# Patient Record
Sex: Male | Born: 1935 | Race: White | Hispanic: No | Smoking: Former smoker
Health system: Southern US, Community
[De-identification: ages and names within clinical notes are randomized; demographics above are authoritative.]

## PROBLEM LIST (undated history)

## (undated) DIAGNOSIS — E119 Type 2 diabetes mellitus without complications: Secondary | ICD-10-CM

## (undated) DIAGNOSIS — Z85038 Personal history of other malignant neoplasm of large intestine: Secondary | ICD-10-CM

## (undated) DIAGNOSIS — E785 Hyperlipidemia, unspecified: Secondary | ICD-10-CM

## (undated) DIAGNOSIS — N200 Calculus of kidney: Secondary | ICD-10-CM

## (undated) DIAGNOSIS — N400 Enlarged prostate without lower urinary tract symptoms: Secondary | ICD-10-CM

## (undated) DIAGNOSIS — I10 Essential (primary) hypertension: Secondary | ICD-10-CM

## (undated) DIAGNOSIS — D649 Anemia, unspecified: Secondary | ICD-10-CM

## (undated) DIAGNOSIS — Z5189 Encounter for other specified aftercare: Secondary | ICD-10-CM

## (undated) DIAGNOSIS — Z720 Tobacco use: Secondary | ICD-10-CM

## (undated) DIAGNOSIS — I482 Chronic atrial fibrillation, unspecified: Secondary | ICD-10-CM

## (undated) DIAGNOSIS — K5900 Constipation, unspecified: Secondary | ICD-10-CM

## (undated) DIAGNOSIS — I469 Cardiac arrest, cause unspecified: Secondary | ICD-10-CM

## (undated) DIAGNOSIS — K861 Other chronic pancreatitis: Secondary | ICD-10-CM

## (undated) HISTORY — PX: LITHOTRIPSY: SUR834

## (undated) HISTORY — PX: TRANSURETHRAL RESECTION OF PROSTATE: SHX73

## (undated) HISTORY — PX: CARDIAC CATHETERIZATION: SHX172

## (undated) HISTORY — PX: CHOLECYSTECTOMY: SHX55

## (undated) HISTORY — PX: EYE SURGERY: SHX253

## (undated) HISTORY — PX: COLON SURGERY: SHX602

---

## 2011-05-27 ENCOUNTER — Other Ambulatory Visit (HOSPITAL_COMMUNITY): Payer: Self-pay

## 2011-05-27 ENCOUNTER — Inpatient Hospital Stay
Admission: AD | Admit: 2011-05-27 | Discharge: 2011-06-18 | Disposition: A | Discharge status: Skilled Nursing Facility | Payer: Self-pay | Source: Ambulatory Visit | Attending: Internal Medicine | Admitting: Internal Medicine

## 2011-05-27 DIAGNOSIS — I517 Cardiomegaly: Secondary | ICD-10-CM

## 2011-05-27 LAB — CBC
HCT: 33.1 % — ABNORMAL LOW (ref 39.0–52.0)
Hemoglobin: 11.4 g/dL — ABNORMAL LOW (ref 13.0–17.0)
WBC: 8.1 10*3/uL (ref 4.0–10.5)

## 2011-05-27 LAB — MAGNESIUM: Magnesium: 2.2 mg/dL (ref 1.5–2.5)

## 2011-05-27 LAB — ABO/RH: ABO/RH(D): O POS

## 2011-05-27 LAB — IRON AND TIBC
Iron: 39 ug/dL — ABNORMAL LOW (ref 42–135)
Saturation Ratios: 21 % (ref 20–55)
TIBC: 189 ug/dL — ABNORMAL LOW (ref 215–435)
UIBC: 150 ug/dL

## 2011-05-27 LAB — BASIC METABOLIC PANEL
BUN: 14 mg/dL (ref 6–23)
Chloride: 106 mEq/L (ref 96–112)
GFR calc Af Amer: >60 mL/min (ref >60)
Glucose, Bld: 97 mg/dL (ref 70–99)
Potassium: 3.2 mEq/L — ABNORMAL LOW (ref 3.5–5.1)
Sodium: 137 mEq/L (ref 135–145)

## 2011-05-27 LAB — FERRITIN: Ferritin: 1093 ng/mL — ABNORMAL HIGH (ref 22–322)

## 2011-05-27 LAB — TYPE AND SCREEN
ABO/RH(D): O POS
Antibody Screen: NEGATIVE

## 2011-05-28 LAB — PROCALCITONIN: Procalcitonin: 0.11 ng/mL

## 2011-05-28 LAB — PREALBUMIN: Prealbumin: 10.7 mg/dL — ABNORMAL LOW (ref 17.0–34.0)

## 2011-05-28 LAB — BASIC METABOLIC PANEL
BUN: 17 mg/dL (ref 6–23)
CO2: 26 mEq/L (ref 19–32)
Chloride: 107 mEq/L (ref 96–112)
GFR calc Af Amer: >60 mL/min (ref >60)
Potassium: 3.5 mEq/L (ref 3.5–5.1)

## 2011-05-28 LAB — OCCULT BLOOD X 1 CARD TO LAB, STOOL: Fecal Occult Bld: NEGATIVE

## 2011-05-29 LAB — PROTIME-INR: INR: 2.13 — ABNORMAL HIGH (ref 0.00–1.49)

## 2011-05-30 ENCOUNTER — Other Ambulatory Visit (HOSPITAL_COMMUNITY): Payer: Self-pay

## 2011-05-30 ENCOUNTER — Other Ambulatory Visit (HOSPITAL_COMMUNITY): Payer: Medicare Other

## 2011-05-30 LAB — BLOOD GAS, ARTERIAL
Bicarbonate: 26.5 mEq/L — ABNORMAL HIGH (ref 20.0–24.0)
FIO2: 0.21 %
FIO2: 0.21 %
Patient temperature: 98.6
Patient temperature: 102.3
pCO2 arterial: 31.6 mmHg — ABNORMAL LOW (ref 35.0–45.0)
pCO2 arterial: 32.5 mmHg — ABNORMAL LOW (ref 35.0–45.0)
pH, Arterial: 7.532 — ABNORMAL HIGH (ref 7.350–7.450)
pH, Arterial: 7.511 — ABNORMAL HIGH (ref 7.350–7.450)
pO2, Arterial: 67.6 mmHg — ABNORMAL LOW (ref 80.0–100.0)

## 2011-05-30 LAB — AMMONIA: Ammonia: 17 umol/L (ref 11–60)

## 2011-05-30 LAB — URINALYSIS, ROUTINE W REFLEX MICROSCOPIC
Bilirubin Urine: NEGATIVE
Protein, ur: 30 mg/dL — AB
Urobilinogen, UA: 0.2 mg/dL (ref 0.0–1.0)

## 2011-05-30 LAB — CLOSTRIDIUM DIFFICILE BY PCR: Toxigenic C. Difficile by PCR: POSITIVE — AB

## 2011-05-30 LAB — PROTIME-INR: Prothrombin Time: 23.8 seconds — ABNORMAL HIGH (ref 11.6–15.2)

## 2011-05-31 ENCOUNTER — Other Ambulatory Visit (HOSPITAL_COMMUNITY): Payer: Self-pay

## 2011-05-31 ENCOUNTER — Other Ambulatory Visit (HOSPITAL_COMMUNITY): Payer: Medicare Other

## 2011-05-31 LAB — BASIC METABOLIC PANEL
CO2: 24 mEq/L (ref 19–32)
Calcium: 8.7 mg/dL (ref 8.4–10.5)
Creatinine, Ser: 1.2 mg/dL (ref 0.50–1.35)
GFR calc non Af Amer: 59 mL/min — ABNORMAL LOW (ref >60)
Sodium: 136 mEq/L (ref 135–145)

## 2011-05-31 LAB — CBC
MCHC: 33.2 g/dL (ref 30.0–36.0)
Platelets: 212 10*3/uL (ref 150–400)
RDW: 19.8 % — ABNORMAL HIGH (ref 11.5–15.5)
WBC: 6.7 10*3/uL (ref 4.0–10.5)

## 2011-05-31 LAB — PROTIME-INR: INR: 2.36 — ABNORMAL HIGH (ref 0.00–1.49)

## 2011-05-31 LAB — DIGOXIN LEVEL: Digoxin Level: 0.4 ng/mL — ABNORMAL LOW (ref 0.8–2.0)

## 2011-05-31 LAB — MAGNESIUM: Magnesium: 2.1 mg/dL (ref 1.5–2.5)

## 2011-06-01 LAB — PROTIME-INR: INR: 3.71 — ABNORMAL HIGH (ref 0.00–1.49)

## 2011-06-02 LAB — BASIC METABOLIC PANEL
BUN: 29 mg/dL — ABNORMAL HIGH (ref 6–23)
Calcium: 8.8 mg/dL (ref 8.4–10.5)
Chloride: 111 mEq/L (ref 96–112)
Creatinine, Ser: 0.88 mg/dL (ref 0.50–1.35)
GFR calc Af Amer: >60 mL/min (ref >60)
GFR calc non Af Amer: >60 mL/min (ref >60)

## 2011-06-02 LAB — CBC
HCT: 33.6 % — ABNORMAL LOW (ref 39.0–52.0)
MCH: 27.2 pg (ref 26.0–34.0)
MCHC: 32.7 g/dL (ref 30.0–36.0)
MCV: 83 fL (ref 78.0–100.0)
Platelets: 258 10*3/uL (ref 150–400)
RDW: 19.1 % — ABNORMAL HIGH (ref 11.5–15.5)

## 2011-06-02 LAB — CULTURE, RESPIRATORY W GRAM STAIN

## 2011-06-02 LAB — URINE CULTURE: Culture  Setup Time: 201208181733

## 2011-06-03 LAB — CULTURE, BLOOD (ROUTINE X 2): Culture  Setup Time: 201208181733

## 2011-06-04 ENCOUNTER — Other Ambulatory Visit (HOSPITAL_COMMUNITY): Payer: Self-pay

## 2011-06-04 LAB — CBC
MCH: 26.4 pg (ref 26.0–34.0)
MCHC: 31.9 g/dL (ref 30.0–36.0)
MCV: 83 fL (ref 78.0–100.0)
Platelets: 335 10*3/uL (ref 150–400)
RBC: 4.35 MIL/uL (ref 4.22–5.81)

## 2011-06-04 LAB — BASIC METABOLIC PANEL
BUN: 30 mg/dL — ABNORMAL HIGH (ref 6–23)
Creatinine, Ser: 0.89 mg/dL (ref 0.50–1.35)
GFR calc non Af Amer: >60 mL/min (ref >60)
Glucose, Bld: 214 mg/dL — ABNORMAL HIGH (ref 70–99)
Potassium: 4.1 mEq/L (ref 3.5–5.1)

## 2011-06-05 LAB — PROTIME-INR
INR: 2.71 — ABNORMAL HIGH (ref 0.00–1.49)
Prothrombin Time: 29.2 seconds — ABNORMAL HIGH (ref 11.6–15.2)

## 2011-06-06 LAB — PROTIME-INR: INR: 2.18 — ABNORMAL HIGH (ref 0.00–1.49)

## 2011-06-07 ENCOUNTER — Other Ambulatory Visit (HOSPITAL_COMMUNITY): Payer: Self-pay

## 2011-06-07 LAB — CBC
HCT: 34.3 % — ABNORMAL LOW (ref 39.0–52.0)
MCHC: 33.8 g/dL (ref 30.0–36.0)
Platelets: 379 10*3/uL (ref 150–400)
RDW: 17.4 % — ABNORMAL HIGH (ref 11.5–15.5)

## 2011-06-07 LAB — BASIC METABOLIC PANEL
CO2: 30 mEq/L (ref 19–32)
Calcium: 9.3 mg/dL (ref 8.4–10.5)
Creatinine, Ser: 0.77 mg/dL (ref 0.50–1.35)
Glucose, Bld: 173 mg/dL — ABNORMAL HIGH (ref 70–99)

## 2011-06-07 LAB — PROTIME-INR: INR: 2.11 — ABNORMAL HIGH (ref 0.00–1.49)

## 2011-06-07 LAB — MAGNESIUM: Magnesium: 2.3 mg/dL (ref 1.5–2.5)

## 2011-06-09 ENCOUNTER — Other Ambulatory Visit (HOSPITAL_COMMUNITY): Payer: Self-pay

## 2011-06-09 LAB — PROTIME-INR
INR: 1.61 — ABNORMAL HIGH (ref 0.00–1.49)
Prothrombin Time: 19.4 seconds — ABNORMAL HIGH (ref 11.6–15.2)

## 2011-06-09 LAB — CULTURE, BLOOD (ROUTINE X 2)
Culture  Setup Time: 201208222233
Culture: NO GROWTH

## 2011-06-10 LAB — CBC
Platelets: 337 10*3/uL (ref 150–400)
RBC: 3.78 MIL/uL — ABNORMAL LOW (ref 4.22–5.81)
WBC: 6.9 10*3/uL (ref 4.0–10.5)

## 2011-06-10 LAB — PROTIME-INR: INR: 1.9 — ABNORMAL HIGH (ref 0.00–1.49)

## 2011-06-10 LAB — BASIC METABOLIC PANEL
CO2: 29 mEq/L (ref 19–32)
Chloride: 97 mEq/L (ref 96–112)
Sodium: 133 mEq/L — ABNORMAL LOW (ref 135–145)

## 2011-06-10 LAB — DIFFERENTIAL
Basophils Relative: 0 % (ref 0–1)
Eosinophils Absolute: 0.2 10*3/uL (ref 0.0–0.7)
Neutrophils Relative %: 62 % (ref 43–77)

## 2011-06-11 LAB — PROTIME-INR
INR: 1.85 — ABNORMAL HIGH (ref 0.00–1.49)
Prothrombin Time: 21.7 seconds — ABNORMAL HIGH (ref 11.6–15.2)

## 2011-06-12 LAB — CBC
MCHC: 34 g/dL (ref 30.0–36.0)
MCV: 80 fL (ref 78.0–100.0)
Platelets: 293 10*3/uL (ref 150–400)
RDW: 18.1 % — ABNORMAL HIGH (ref 11.5–15.5)
WBC: 5.8 10*3/uL (ref 4.0–10.5)

## 2011-06-12 LAB — DIFFERENTIAL
Basophils Absolute: 0 10*3/uL (ref 0.0–0.1)
Eosinophils Absolute: 0.1 10*3/uL (ref 0.0–0.7)
Eosinophils Relative: 2 % (ref 0–5)
Lymphs Abs: 1.5 10*3/uL (ref 0.7–4.0)

## 2011-06-12 LAB — BASIC METABOLIC PANEL
Calcium: 9 mg/dL (ref 8.4–10.5)
Chloride: 96 mEq/L (ref 96–112)
Creatinine, Ser: 0.76 mg/dL (ref 0.50–1.35)
GFR calc Af Amer: >60 mL/min (ref >60)
GFR calc non Af Amer: >60 mL/min (ref >60)

## 2011-06-12 LAB — PROTIME-INR
INR: 2.5 — ABNORMAL HIGH (ref 0.00–1.49)
Prothrombin Time: 27.4 seconds — ABNORMAL HIGH (ref 11.6–15.2)

## 2011-06-13 LAB — CLOSTRIDIUM DIFFICILE BY PCR: Toxigenic C. Difficile by PCR: NEGATIVE

## 2011-06-13 LAB — PROTIME-INR: Prothrombin Time: 31.2 seconds — ABNORMAL HIGH (ref 11.6–15.2)

## 2011-06-14 LAB — OSMOLALITY: Osmolality: 278 mOsm/kg (ref 275–300)

## 2011-06-14 LAB — PROTIME-INR
INR: 2.36 — ABNORMAL HIGH (ref 0.00–1.49)
Prothrombin Time: 26.2 seconds — ABNORMAL HIGH (ref 11.6–15.2)

## 2011-06-14 LAB — BASIC METABOLIC PANEL
Calcium: 9 mg/dL (ref 8.4–10.5)
Creatinine, Ser: 0.72 mg/dL (ref 0.50–1.35)
GFR calc Af Amer: >60 mL/min (ref >60)
GFR calc non Af Amer: >60 mL/min (ref >60)
Sodium: 130 mEq/L — ABNORMAL LOW (ref 135–145)

## 2011-06-14 LAB — MAGNESIUM: Magnesium: 1.8 mg/dL (ref 1.5–2.5)

## 2011-06-15 LAB — BASIC METABOLIC PANEL
BUN: 17 mg/dL (ref 6–23)
CO2: 27 mEq/L (ref 19–32)
Chloride: 97 mEq/L (ref 96–112)
Creatinine, Ser: 0.71 mg/dL (ref 0.50–1.35)
GFR calc Af Amer: >60 mL/min (ref >60)
Glucose, Bld: 267 mg/dL — ABNORMAL HIGH (ref 70–99)
Potassium: 4.6 mEq/L (ref 3.5–5.1)

## 2011-06-15 LAB — CBC
HCT: 29.4 % — ABNORMAL LOW (ref 39.0–52.0)
MCH: 27.3 pg (ref 26.0–34.0)
MCV: 81 fL (ref 78.0–100.0)
RDW: 18.5 % — ABNORMAL HIGH (ref 11.5–15.5)
WBC: 6 10*3/uL (ref 4.0–10.5)

## 2011-06-15 LAB — DIFFERENTIAL
Eosinophils Absolute: 0.1 10*3/uL (ref 0.0–0.7)
Eosinophils Relative: 2 % (ref 0–5)
Lymphocytes Relative: 24 % (ref 12–46)
Lymphs Abs: 1.4 10*3/uL (ref 0.7–4.0)
Monocytes Relative: 11 % (ref 3–12)

## 2011-06-15 LAB — PROTIME-INR: Prothrombin Time: 21.8 seconds — ABNORMAL HIGH (ref 11.6–15.2)

## 2011-06-16 LAB — PROTIME-INR
INR: 1.54 — ABNORMAL HIGH (ref 0.00–1.49)
Prothrombin Time: 18.8 s — ABNORMAL HIGH (ref 11.6–15.2)

## 2011-06-17 LAB — PROTIME-INR: INR: 1.53 — ABNORMAL HIGH (ref 0.00–1.49)

## 2011-06-18 LAB — CBC
Platelets: 206 10*3/uL (ref 150–400)
RBC: 3.33 MIL/uL — ABNORMAL LOW (ref 4.22–5.81)
RDW: 18.8 % — ABNORMAL HIGH (ref 11.5–15.5)
WBC: 5.8 10*3/uL (ref 4.0–10.5)

## 2011-06-18 LAB — BASIC METABOLIC PANEL
Calcium: 8.9 mg/dL (ref 8.4–10.5)
GFR calc Af Amer: >60 mL/min (ref >60)
GFR calc non Af Amer: >60 mL/min (ref >60)
Potassium: 4.2 mEq/L (ref 3.5–5.1)
Sodium: 133 mEq/L — ABNORMAL LOW (ref 135–145)

## 2011-06-18 LAB — PROTIME-INR: INR: 1.63 — ABNORMAL HIGH (ref 0.00–1.49)

## 2014-09-03 HISTORY — PX: HERNIA REPAIR: SHX51

## 2016-01-17 ENCOUNTER — Inpatient Hospital Stay (HOSPITAL_COMMUNITY): Payer: Medicare Other

## 2016-01-17 ENCOUNTER — Encounter (HOSPITAL_COMMUNITY): Payer: Self-pay | Admitting: Internal Medicine

## 2016-01-17 ENCOUNTER — Telehealth: Payer: Self-pay | Admitting: Internal Medicine

## 2016-01-17 ENCOUNTER — Inpatient Hospital Stay (HOSPITAL_COMMUNITY)
Admission: AD | Admit: 2016-01-17 | Discharge: 2016-02-15 | DRG: 682 | Disposition: A | Discharge status: Skilled Nursing Facility | Payer: Medicare Other | Source: Other Acute Inpatient Hospital | Attending: Internal Medicine | Admitting: Internal Medicine

## 2016-01-17 DIAGNOSIS — R0602 Shortness of breath: Secondary | ICD-10-CM

## 2016-01-17 DIAGNOSIS — Z955 Presence of coronary angioplasty implant and graft: Secondary | ICD-10-CM | POA: Diagnosis not present

## 2016-01-17 DIAGNOSIS — Z880 Allergy status to penicillin: Secondary | ICD-10-CM | POA: Diagnosis not present

## 2016-01-17 DIAGNOSIS — Y95 Nosocomial condition: Secondary | ICD-10-CM | POA: Diagnosis present

## 2016-01-17 DIAGNOSIS — J810 Acute pulmonary edema: Secondary | ICD-10-CM | POA: Insufficient documentation

## 2016-01-17 DIAGNOSIS — J9602 Acute respiratory failure with hypercapnia: Secondary | ICD-10-CM | POA: Diagnosis not present

## 2016-01-17 DIAGNOSIS — Z85038 Personal history of other malignant neoplasm of large intestine: Secondary | ICD-10-CM

## 2016-01-17 DIAGNOSIS — Z9049 Acquired absence of other specified parts of digestive tract: Secondary | ICD-10-CM | POA: Diagnosis not present

## 2016-01-17 DIAGNOSIS — R079 Chest pain, unspecified: Secondary | ICD-10-CM | POA: Diagnosis not present

## 2016-01-17 DIAGNOSIS — R0600 Dyspnea, unspecified: Secondary | ICD-10-CM | POA: Diagnosis not present

## 2016-01-17 DIAGNOSIS — N1330 Unspecified hydronephrosis: Secondary | ICD-10-CM | POA: Diagnosis present

## 2016-01-17 DIAGNOSIS — Z87891 Personal history of nicotine dependence: Secondary | ICD-10-CM | POA: Diagnosis not present

## 2016-01-17 DIAGNOSIS — E440 Moderate protein-calorie malnutrition: Secondary | ICD-10-CM | POA: Diagnosis present

## 2016-01-17 DIAGNOSIS — I482 Chronic atrial fibrillation, unspecified: Secondary | ICD-10-CM | POA: Diagnosis present

## 2016-01-17 DIAGNOSIS — K861 Other chronic pancreatitis: Secondary | ICD-10-CM | POA: Diagnosis present

## 2016-01-17 DIAGNOSIS — Z87440 Personal history of urinary (tract) infections: Secondary | ICD-10-CM

## 2016-01-17 DIAGNOSIS — Z882 Allergy status to sulfonamides status: Secondary | ICD-10-CM

## 2016-01-17 DIAGNOSIS — N390 Urinary tract infection, site not specified: Secondary | ICD-10-CM | POA: Diagnosis present

## 2016-01-17 DIAGNOSIS — Z7901 Long term (current) use of anticoagulants: Secondary | ICD-10-CM

## 2016-01-17 DIAGNOSIS — Z9079 Acquired absence of other genital organ(s): Secondary | ICD-10-CM

## 2016-01-17 DIAGNOSIS — G4700 Insomnia, unspecified: Secondary | ICD-10-CM | POA: Diagnosis present

## 2016-01-17 DIAGNOSIS — N170 Acute kidney failure with tubular necrosis: Secondary | ICD-10-CM | POA: Diagnosis present

## 2016-01-17 DIAGNOSIS — G9340 Encephalopathy, unspecified: Secondary | ICD-10-CM | POA: Diagnosis not present

## 2016-01-17 DIAGNOSIS — I959 Hypotension, unspecified: Secondary | ICD-10-CM | POA: Diagnosis present

## 2016-01-17 DIAGNOSIS — Z87442 Personal history of urinary calculi: Secondary | ICD-10-CM | POA: Diagnosis not present

## 2016-01-17 DIAGNOSIS — E875 Hyperkalemia: Secondary | ICD-10-CM | POA: Diagnosis present

## 2016-01-17 DIAGNOSIS — E876 Hypokalemia: Secondary | ICD-10-CM | POA: Diagnosis not present

## 2016-01-17 DIAGNOSIS — Z8674 Personal history of sudden cardiac arrest: Secondary | ICD-10-CM

## 2016-01-17 DIAGNOSIS — J189 Pneumonia, unspecified organism: Secondary | ICD-10-CM | POA: Diagnosis present

## 2016-01-17 DIAGNOSIS — Z806 Family history of leukemia: Secondary | ICD-10-CM

## 2016-01-17 DIAGNOSIS — E870 Hyperosmolality and hypernatremia: Secondary | ICD-10-CM | POA: Diagnosis present

## 2016-01-17 DIAGNOSIS — E872 Acidosis, unspecified: Secondary | ICD-10-CM | POA: Diagnosis present

## 2016-01-17 DIAGNOSIS — Z794 Long term (current) use of insulin: Secondary | ICD-10-CM

## 2016-01-17 DIAGNOSIS — E860 Dehydration: Secondary | ICD-10-CM | POA: Diagnosis present

## 2016-01-17 DIAGNOSIS — M19041 Primary osteoarthritis, right hand: Secondary | ICD-10-CM | POA: Diagnosis present

## 2016-01-17 DIAGNOSIS — I10 Essential (primary) hypertension: Secondary | ICD-10-CM | POA: Diagnosis present

## 2016-01-17 DIAGNOSIS — Z720 Tobacco use: Secondary | ICD-10-CM

## 2016-01-17 DIAGNOSIS — E785 Hyperlipidemia, unspecified: Secondary | ICD-10-CM | POA: Diagnosis present

## 2016-01-17 DIAGNOSIS — Z881 Allergy status to other antibiotic agents status: Secondary | ICD-10-CM | POA: Diagnosis not present

## 2016-01-17 DIAGNOSIS — Z888 Allergy status to other drugs, medicaments and biological substances status: Secondary | ICD-10-CM

## 2016-01-17 DIAGNOSIS — N179 Acute kidney failure, unspecified: Secondary | ICD-10-CM | POA: Diagnosis not present

## 2016-01-17 DIAGNOSIS — N131 Hydronephrosis with ureteral stricture, not elsewhere classified: Secondary | ICD-10-CM | POA: Diagnosis present

## 2016-01-17 DIAGNOSIS — N400 Enlarged prostate without lower urinary tract symptoms: Secondary | ICD-10-CM | POA: Diagnosis present

## 2016-01-17 DIAGNOSIS — M19072 Primary osteoarthritis, left ankle and foot: Secondary | ICD-10-CM | POA: Diagnosis present

## 2016-01-17 DIAGNOSIS — R102 Pelvic and perineal pain: Secondary | ICD-10-CM | POA: Diagnosis present

## 2016-01-17 DIAGNOSIS — E1165 Type 2 diabetes mellitus with hyperglycemia: Secondary | ICD-10-CM | POA: Diagnosis present

## 2016-01-17 DIAGNOSIS — B3749 Other urogenital candidiasis: Secondary | ICD-10-CM | POA: Diagnosis present

## 2016-01-17 DIAGNOSIS — K5900 Constipation, unspecified: Secondary | ICD-10-CM | POA: Diagnosis present

## 2016-01-17 DIAGNOSIS — Z01818 Encounter for other preprocedural examination: Secondary | ICD-10-CM

## 2016-01-17 DIAGNOSIS — J151 Pneumonia due to Pseudomonas: Secondary | ICD-10-CM | POA: Diagnosis present

## 2016-01-17 DIAGNOSIS — N202 Calculus of kidney with calculus of ureter: Secondary | ICD-10-CM | POA: Diagnosis present

## 2016-01-17 DIAGNOSIS — M19042 Primary osteoarthritis, left hand: Secondary | ICD-10-CM | POA: Diagnosis present

## 2016-01-17 DIAGNOSIS — N200 Calculus of kidney: Secondary | ICD-10-CM

## 2016-01-17 DIAGNOSIS — D638 Anemia in other chronic diseases classified elsewhere: Secondary | ICD-10-CM | POA: Diagnosis not present

## 2016-01-17 DIAGNOSIS — I469 Cardiac arrest, cause unspecified: Secondary | ICD-10-CM

## 2016-01-17 DIAGNOSIS — M19071 Primary osteoarthritis, right ankle and foot: Secondary | ICD-10-CM | POA: Diagnosis present

## 2016-01-17 DIAGNOSIS — Z4659 Encounter for fitting and adjustment of other gastrointestinal appliance and device: Secondary | ICD-10-CM

## 2016-01-17 DIAGNOSIS — Z79899 Other long term (current) drug therapy: Secondary | ICD-10-CM

## 2016-01-17 DIAGNOSIS — Z681 Body mass index (BMI) 19 or less, adult: Secondary | ICD-10-CM

## 2016-01-17 DIAGNOSIS — J811 Chronic pulmonary edema: Secondary | ICD-10-CM

## 2016-01-17 DIAGNOSIS — J9601 Acute respiratory failure with hypoxia: Secondary | ICD-10-CM | POA: Insufficient documentation

## 2016-01-17 DIAGNOSIS — E119 Type 2 diabetes mellitus without complications: Secondary | ICD-10-CM

## 2016-01-17 HISTORY — DX: Type 2 diabetes mellitus without complications: E11.9

## 2016-01-17 HISTORY — DX: Essential (primary) hypertension: I10

## 2016-01-17 HISTORY — DX: Calculus of kidney: N20.0

## 2016-01-17 HISTORY — DX: Tobacco use: Z72.0

## 2016-01-17 HISTORY — DX: Chronic atrial fibrillation, unspecified: I48.20

## 2016-01-17 HISTORY — DX: Benign prostatic hyperplasia without lower urinary tract symptoms: N40.0

## 2016-01-17 HISTORY — DX: Cardiac arrest, cause unspecified: I46.9

## 2016-01-17 HISTORY — DX: Personal history of other malignant neoplasm of large intestine: Z85.038

## 2016-01-17 HISTORY — DX: Hyperlipidemia, unspecified: E78.5

## 2016-01-17 HISTORY — DX: Other chronic pancreatitis: K86.1

## 2016-01-17 HISTORY — DX: Constipation, unspecified: K59.00

## 2016-01-17 LAB — CBC WITH DIFFERENTIAL/PLATELET
BASOS ABS: 0 10*3/uL (ref 0.0–0.1)
Basophils Relative: 0 %
EOS ABS: 0.1 10*3/uL (ref 0.0–0.7)
Eosinophils Relative: 2 %
HEMATOCRIT: 23.1 % — AB (ref 39.0–52.0)
HEMOGLOBIN: 8.3 g/dL — AB (ref 13.0–17.0)
LYMPHS PCT: 17 %
Lymphs Abs: 1 10*3/uL (ref 0.7–4.0)
MCH: 30.7 pg (ref 26.0–34.0)
MCHC: 35.9 g/dL (ref 30.0–36.0)
MCV: 85.6 fL (ref 78.0–100.0)
MONOS PCT: 10 %
Monocytes Absolute: 0.6 10*3/uL (ref 0.1–1.0)
NEUTROS PCT: 71 %
Neutro Abs: 4.2 10*3/uL (ref 1.7–7.7)
Platelets: 213 10*3/uL (ref 150–400)
RBC: 2.7 MIL/uL — ABNORMAL LOW (ref 4.22–5.81)
RDW: 13.9 % (ref 11.5–15.5)
WBC: 5.9 10*3/uL (ref 4.0–10.5)

## 2016-01-17 LAB — IRON AND TIBC
Iron: 90 ug/dL (ref 45–182)
SATURATION RATIOS: 37 % (ref 17.9–39.5)
TIBC: 245 ug/dL — AB (ref 250–450)
UIBC: 155 ug/dL

## 2016-01-17 LAB — URINALYSIS, ROUTINE W REFLEX MICROSCOPIC
BILIRUBIN URINE: NEGATIVE
GLUCOSE, UA: NEGATIVE mg/dL
KETONES UR: NEGATIVE mg/dL
NITRITE: NEGATIVE
PROTEIN: 30 mg/dL — AB
Specific Gravity, Urine: 1.01 (ref 1.005–1.030)
pH: 5 (ref 5.0–8.0)

## 2016-01-17 LAB — URINE MICROSCOPIC-ADD ON

## 2016-01-17 LAB — STREP PNEUMONIAE URINARY ANTIGEN: Strep Pneumo Urinary Antigen: NEGATIVE

## 2016-01-17 LAB — COMPREHENSIVE METABOLIC PANEL
ALT: 59 U/L (ref 17–63)
ANION GAP: 16 — AB (ref 5–15)
AST: 43 U/L — ABNORMAL HIGH (ref 15–41)
Albumin: 3.5 g/dL (ref 3.5–5.0)
Alkaline Phosphatase: 78 U/L (ref 38–126)
BUN: 67 mg/dL — ABNORMAL HIGH (ref 6–20)
CHLORIDE: 115 mmol/L — AB (ref 101–111)
CO2: 13 mmol/L — AB (ref 22–32)
Calcium: 8.3 mg/dL — ABNORMAL LOW (ref 8.9–10.3)
Creatinine, Ser: 3.7 mg/dL — ABNORMAL HIGH (ref 0.61–1.24)
GFR calc non Af Amer: 14 mL/min — ABNORMAL LOW (ref >60)
GFR, EST AFRICAN AMERICAN: 17 mL/min — AB (ref >60)
Glucose, Bld: 60 mg/dL — ABNORMAL LOW (ref 65–99)
POTASSIUM: 4.5 mmol/L (ref 3.5–5.1)
SODIUM: 144 mmol/L (ref 135–145)
Total Bilirubin: 0.5 mg/dL (ref 0.3–1.2)
Total Protein: 6.9 g/dL (ref 6.5–8.1)

## 2016-01-17 LAB — CREATININE, URINE, RANDOM: CREATININE, URINE: 30.86 mg/dL

## 2016-01-17 LAB — FERRITIN: FERRITIN: 241 ng/mL (ref 24–336)

## 2016-01-17 LAB — PHOSPHORUS: PHOSPHORUS: 7.7 mg/dL — AB (ref 2.5–4.6)

## 2016-01-17 LAB — APTT: APTT: 45 s — AB (ref 24–37)

## 2016-01-17 LAB — RETICULOCYTES
RBC.: 2.71 MIL/uL — AB (ref 4.22–5.81)
RETIC CT PCT: 0.7 % (ref 0.4–3.1)
Retic Count, Absolute: 19 10*3/uL (ref 19.0–186.0)

## 2016-01-17 LAB — SODIUM, URINE, RANDOM: Sodium, Ur: 66 mmol/L

## 2016-01-17 LAB — MAGNESIUM: Magnesium: 1.6 mg/dL — ABNORMAL LOW (ref 1.7–2.4)

## 2016-01-17 LAB — FOLATE: Folate: 38 ng/mL (ref >5.9)

## 2016-01-17 LAB — PROTIME-INR
INR: 4.27 — AB (ref 0.00–1.49)
PROTHROMBIN TIME: 38.7 s — AB (ref 11.6–15.2)

## 2016-01-17 LAB — VITAMIN B12: VITAMIN B 12: 702 pg/mL (ref 180–914)

## 2016-01-17 MED ORDER — METOPROLOL TARTRATE 50 MG PO TABS
50.0000 mg | ORAL_TABLET | Freq: Two times a day (BID) | ORAL | Status: DC
  Administered 2016-01-17 – 2016-01-24 (×15): 50 mg via ORAL
  Filled 2016-01-17 (×15): qty 1

## 2016-01-17 MED ORDER — ACETAMINOPHEN 650 MG RE SUPP
650.0000 mg | Freq: Four times a day (QID) | RECTAL | Status: DC | PRN
  Administered 2016-02-04: 650 mg via RECTAL
  Filled 2016-01-17: qty 1

## 2016-01-17 MED ORDER — PANTOPRAZOLE SODIUM 40 MG PO TBEC
40.0000 mg | DELAYED_RELEASE_TABLET | Freq: Every day | ORAL | Status: DC
  Administered 2016-01-18 – 2016-01-31 (×13): 40 mg via ORAL
  Filled 2016-01-17 (×13): qty 1

## 2016-01-17 MED ORDER — MORPHINE SULFATE (PF) 2 MG/ML IV SOLN
1.0000 mg | INTRAVENOUS | Status: DC | PRN
  Administered 2016-01-19: 2 mg via INTRAVENOUS
  Filled 2016-01-17: qty 1

## 2016-01-17 MED ORDER — OXYCODONE HCL 5 MG PO TABS
5.0000 mg | ORAL_TABLET | ORAL | Status: DC | PRN
  Administered 2016-01-19 – 2016-01-22 (×6): 5 mg via ORAL
  Filled 2016-01-17 (×6): qty 1

## 2016-01-17 MED ORDER — IPRATROPIUM BROMIDE 0.02 % IN SOLN
0.5000 mg | RESPIRATORY_TRACT | Status: DC | PRN
  Administered 2016-01-31 (×2): 0.5 mg via RESPIRATORY_TRACT
  Filled 2016-01-17 (×2): qty 2.5

## 2016-01-17 MED ORDER — INSULIN ASPART 100 UNIT/ML ~~LOC~~ SOLN
0.0000 [IU] | Freq: Three times a day (TID) | SUBCUTANEOUS | Status: DC
  Administered 2016-01-18: 5 [IU] via SUBCUTANEOUS
  Administered 2016-01-18: 3 [IU] via SUBCUTANEOUS
  Administered 2016-01-18: 1 [IU] via SUBCUTANEOUS
  Administered 2016-01-19: 5 [IU] via SUBCUTANEOUS
  Administered 2016-01-19: 3 [IU] via SUBCUTANEOUS
  Administered 2016-01-20: 2 [IU] via SUBCUTANEOUS
  Administered 2016-01-21: 5 [IU] via SUBCUTANEOUS
  Administered 2016-01-21: 2 [IU] via SUBCUTANEOUS
  Administered 2016-01-21: 3 [IU] via SUBCUTANEOUS
  Administered 2016-01-22: 9 [IU] via SUBCUTANEOUS
  Administered 2016-01-22: 2 [IU] via SUBCUTANEOUS
  Administered 2016-01-22 – 2016-01-23 (×3): 5 [IU] via SUBCUTANEOUS
  Administered 2016-01-24 (×2): 2 [IU] via SUBCUTANEOUS
  Administered 2016-01-24: 5 [IU] via SUBCUTANEOUS
  Administered 2016-01-25: 3 [IU] via SUBCUTANEOUS
  Administered 2016-01-25: 1 [IU] via SUBCUTANEOUS
  Administered 2016-01-26: 5 [IU] via SUBCUTANEOUS
  Administered 2016-01-27: 3 [IU] via SUBCUTANEOUS
  Administered 2016-01-27: 2 [IU] via SUBCUTANEOUS
  Administered 2016-01-28: 3 [IU] via SUBCUTANEOUS
  Administered 2016-01-28: 5 [IU] via SUBCUTANEOUS
  Administered 2016-01-29: 2 [IU] via SUBCUTANEOUS
  Administered 2016-01-29: 7 [IU] via SUBCUTANEOUS
  Administered 2016-01-29: 3 [IU] via SUBCUTANEOUS
  Administered 2016-01-30 (×2): 1 [IU] via SUBCUTANEOUS
  Administered 2016-01-30: 2 [IU] via SUBCUTANEOUS
  Administered 2016-01-31 (×2): 1 [IU] via SUBCUTANEOUS

## 2016-01-17 MED ORDER — SORBITOL 70 % SOLN
30.0000 mL | Freq: Every day | Status: DC | PRN
  Filled 2016-01-17: qty 30

## 2016-01-17 MED ORDER — LEVOFLOXACIN IN D5W 500 MG/100ML IV SOLN
500.0000 mg | INTRAVENOUS | Status: AC
  Administered 2016-01-19 – 2016-01-21 (×2): 500 mg via INTRAVENOUS
  Filled 2016-01-17 (×2): qty 100

## 2016-01-17 MED ORDER — LEVALBUTEROL HCL 0.63 MG/3ML IN NEBU
0.6300 mg | INHALATION_SOLUTION | RESPIRATORY_TRACT | Status: DC | PRN
Start: 2016-01-17 — End: 2016-02-15
  Administered 2016-01-31 (×2): 0.63 mg via RESPIRATORY_TRACT
  Filled 2016-01-17 (×2): qty 3

## 2016-01-17 MED ORDER — TRIAMCINOLONE ACETONIDE 0.1 % EX CREA: 1.0000 "application " | TOPICAL_CREAM | Freq: Two times a day (BID) | CUTANEOUS | Status: DC | PRN

## 2016-01-17 MED ORDER — ALPRAZOLAM 0.5 MG PO TABS
0.5000 mg | ORAL_TABLET | Freq: Three times a day (TID) | ORAL | Status: DC | PRN
  Administered 2016-01-18 – 2016-01-31 (×8): 0.5 mg via ORAL
  Filled 2016-01-17 (×8): qty 1

## 2016-01-17 MED ORDER — AMITRIPTYLINE HCL 25 MG PO TABS
25.0000 mg | ORAL_TABLET | Freq: Every day | ORAL | Status: DC
  Administered 2016-01-17 – 2016-01-31 (×15): 25 mg via ORAL
  Filled 2016-01-17 (×15): qty 1

## 2016-01-17 MED ORDER — SENNA 8.6 MG PO TABS
1.0000 | ORAL_TABLET | Freq: Two times a day (BID) | ORAL | Status: DC
  Administered 2016-01-17 – 2016-01-31 (×25): 8.6 mg via ORAL
  Filled 2016-01-17 (×27): qty 1

## 2016-01-17 MED ORDER — ACETAMINOPHEN 325 MG PO TABS
650.0000 mg | ORAL_TABLET | Freq: Four times a day (QID) | ORAL | Status: DC | PRN
Start: 2016-01-17 — End: 2016-02-07
  Administered 2016-01-20 – 2016-02-03 (×2): 650 mg via ORAL
  Filled 2016-01-17 (×2): qty 2

## 2016-01-17 MED ORDER — MAGNESIUM SULFATE 2 GM/50ML IV SOLN
2.0000 g | Freq: Once | INTRAVENOUS | Status: AC
  Administered 2016-01-17: 2 g via INTRAVENOUS
  Filled 2016-01-17: qty 50

## 2016-01-17 MED ORDER — SODIUM CHLORIDE 0.9 % IV BOLUS (SEPSIS)
1000.0000 mL | Freq: Once | INTRAVENOUS | Status: AC
  Administered 2016-01-17: 1000 mL via INTRAVENOUS

## 2016-01-17 MED ORDER — AMLODIPINE BESYLATE 5 MG PO TABS
2.5000 mg | ORAL_TABLET | Freq: Every day | ORAL | Status: DC
  Administered 2016-01-17 – 2016-01-21 (×5): 2.5 mg via ORAL
  Filled 2016-01-17 (×5): qty 1

## 2016-01-17 MED ORDER — POLYETHYLENE GLYCOL 3350 17 G PO PACK
17.0000 g | PACK | Freq: Two times a day (BID) | ORAL | Status: DC
  Administered 2016-01-17 – 2016-01-31 (×25): 17 g via ORAL
  Filled 2016-01-17 (×28): qty 1

## 2016-01-17 MED ORDER — DUTASTERIDE 0.5 MG PO CAPS
0.5000 mg | ORAL_CAPSULE | Freq: Every day | ORAL | Status: DC
  Administered 2016-01-18 – 2016-02-15 (×24): 0.5 mg via ORAL
  Filled 2016-01-17 (×31): qty 1

## 2016-01-17 MED ORDER — ONDANSETRON HCL 4 MG PO TABS: 4.0000 mg | ORAL_TABLET | Freq: Four times a day (QID) | ORAL | Status: DC | PRN

## 2016-01-17 MED ORDER — SODIUM BICARBONATE 8.4 % IV SOLN
INTRAVENOUS | Status: DC
  Administered 2016-01-17 – 2016-01-20 (×6): via INTRAVENOUS
  Filled 2016-01-17 (×6): qty 850

## 2016-01-17 MED ORDER — ONDANSETRON HCL 4 MG/2ML IJ SOLN: 4.0000 mg | Freq: Four times a day (QID) | INTRAMUSCULAR | Status: DC | PRN

## 2016-01-17 MED ORDER — ATORVASTATIN CALCIUM 10 MG PO TABS
20.0000 mg | ORAL_TABLET | Freq: Every day | ORAL | Status: DC
  Administered 2016-01-17 – 2016-01-31 (×16): 20 mg via ORAL
  Filled 2016-01-17 (×16): qty 2

## 2016-01-17 MED ORDER — METOPROLOL TARTRATE 50 MG PO TABS: 50.0000 mg | ORAL_TABLET | Freq: Two times a day (BID) | ORAL | Status: DC

## 2016-01-17 MED ORDER — LEVOFLOXACIN IN D5W 750 MG/150ML IV SOLN
750.0000 mg | INTRAVENOUS | Status: AC
  Administered 2016-01-17: 750 mg via INTRAVENOUS
  Filled 2016-01-17: qty 150

## 2016-01-17 NOTE — Evaluation (Signed)
Clinical/Bedside Swallow Evaluation Patient Details  Name: CORNELIOUS BARTOLUCCI MRN: [id number] Date of Birth: 1935/09/30  Today's Date: 01/17/2016 Time: SLP Start Time (ACUTE ONLY): 1520 SLP Stop Time (ACUTE ONLY): 1535 SLP Time Calculation (min) (ACUTE ONLY): 15 min  Past Medical History:  Past Medical History  Diagnosis Date  . Recurrent nephrolithiasis 01/17/2016  . Chronic a-fib (HCC) 01/17/2016  . DM (diabetes mellitus) (HCC) 01/17/2016  . BPH (benign prostatic hyperplasia) 01/17/2016  . History of colon cancer 01/17/2016  . Constipation 01/17/2016  . HTN (hypertension), benign 01/17/2016  . Chronic pancreatitis (HCC) 01/17/2016  . Hyperlipidemia 01/17/2016  . Cardiac arrest (HCC) 01/17/2016  . Chewing tobacco use 01/17/2016   Past Surgical History:  Past Surgical History  Procedure Laterality Date  . Cholecystectomy    . Colon surgery      right hemicolectomy  . Cardiac catheterization      cardiac stents  . Transurethral resection of prostate    . Hernia repair Right 09/03/2014    incisional hernia repair/impantation of mesh/ventral hernia repair  . Lithotripsy    . Eye surgery      bilateral cataracts   HPI:  81 year old male admitted 01/17/16 with elevated creatinine, ? UTI, ? PNA. 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 Positioning was suboptimal, so SLP requested pt to move into a more upright position. Pt was self feeding regular diet, thin liquids. No overt s/s aspiration observed with any consistency, and pt/daughter report no history of dysphagia. 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  Recommend continuing with regular diet and thin liquids. No further ST intervention is recommended at this time. Please reconsult if needs arise.     Aspiration  Risk  Mild aspiration risk    Diet Recommendation Regular;Thin liquid   Liquid Administration via: Cup;Straw Medication Administration: Whole meds with liquid Supervision: Patient able to self feed Compensations: Minimize environmental distractions;Small sips/bites;Slow rate Postural Changes: Seated upright at 90 degrees    Other  Recommendations Oral Care Recommendations: Oral care BID   Follow up Recommendations    n/a   Frequency and Duration   n/a         Prognosis Prognosis for Safe Diet Advancement: Good      Swallow Study   General Date of Onset: 01/17/16 HPI: 81 year old male admitted 01/17/16 with elevated creatinine, ? UTI, ? PNA. 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 Type of Study: Bedside Swallow Evaluation Previous Swallow Assessment: none Diet Prior to this Study: Regular;Thin liquids Temperature Spikes Noted: No Respiratory Status: Room air History of Recent Intubation: No Behavior/Cognition: Alert;Cooperative;Pleasant mood Oral Cavity Assessment: Within Functional Limits Oral Care Completed by SLP: No (pt eating lunch upon arrival of SLP) Oral Cavity - Dentition: Missing dentition Vision: Functional for self-feeding Self-Feeding Abilities: Able to feed self Patient Positioning: Partially reclined Baseline Vocal Quality: Normal Volitional Cough: Strong Volitional Swallow: Able to elicit    Oral/Motor/Sensory Function Overall Oral Motor/Sensory Function: Within functional limits   Ice Chips Ice chips: Not tested   Thin Liquid Thin Liquid: Within functional limits Presentation: Straw;Cup;Self Fed    Nectar Thick Nectar Thick Liquid: Not tested   Honey Thick Honey Thick Liquid: Not tested   Puree Puree:  Within functional limits Presentation: Spoon;Self Fed   Solid   GO   Solid: Within functional limits Presentation: Self Fed        Leigh AuroraBueche, Celia Brown 01/17/2016,3:39 PM  Harlow Asaelia B.  Bueche, MSP, CCC-SLP [phone]

## 2016-01-17 NOTE — Progress Notes (Signed)
Brief Pharmacy note:  Pharmacy may adjust antibiotic dose based on patient's renal function  Plan: -Adjust levaquin to 750mg  IV x 1 then 500mg  q48h for CrCl < 20 mls/min - follow renal function and adjust as indicated  Arley PhenixEllen Nikholas Geffre RPh 01/17/2016, 3:23 PM Pager [phone]

## 2016-01-17 NOTE — H&P (Signed)
[facility] History and Physical  Paul Guerra ZOX:[id number] DOB: 07/09/1935 DOA: 01/17/2016  Referring physician: Dr Paul Guerra, [facility] PCP: No primary care provider on file.   Chief Complaint: ARF/Hydronephrosis  HPI: Paul Guerra is a 81 y.o. male  With history of chronic pancreatitis, diabetes mellitus, BPH, chronic anemia, remote history of colon cancer status post right hemicolectomy, hypertension, hyperlipidemia, history of recurrent UTIs, history of recurrent kidney stones, history of atrial fibrillation on chronic anticoagulation with Coumadin who presented to [facility] emergency department with a elevated creatinine 4 baseline approximately  1.1 and probable UTI and pneumonia. CT stone study protocol which was done showed a right hydronephrosis and concern for obstruction. Physician the [facility] spoke with Paul Guerra urology who accepted the patient in consult. It was noted that patient also had an episode of hypoglycemia which has since resolved. INR there was 4.2. Patient was also noted to have a metabolic acidosis felt to be secondary to acute renal failure. Per patient and daughter patient has been having urinary frequency, dysuria, occasional confusion, suprapubic abdominal pain over the past month. It was noted per daughter that patient has had recurrent UTIs approximately 10-12 over the past 2 years. Patient saw PCP 2 days prior to admission with his urinary symptoms was diagnosed with a UTI and placed on ciprofloxacin area on the way home patient was called by PCP and told to go to the nearest ED for IV fluids and antibiotics. Per daughter on presentation to the ED patient was noted to have a blood pressure 97/51 CT scan done was worst L4 hydronephrosis and kidney stones and chest x-ray were sulfa pneumonia and a such patient was sent as a transfer to [facility]. Patient denies any fevers, no chills, no chest pain, no shortness of breath, no nausea, no  vomiting, no diarrhea, no melena, no hematemesis, no hematochezia. Patient does endorse right suprapubic pain, nonproductive cough. No other associated symptoms.     Review of Systems: as per history of present illness otherwise negative.  Constitutional:  No weight loss, night sweats, Fevers, chills, fatigue.  HEENT:  No headaches, Difficulty swallowing,Tooth/dental problems,Sore throat,  No sneezing, itching, ear ache, nasal congestion, post nasal drip,  Cardio-vascular:  No chest pain, Orthopnea, PND, swelling in lower extremities, anasarca, dizziness, palpitations  GI:  No heartburn, indigestion, abdominal pain, nausea, vomiting, diarrhea, change in bowel habits, loss of appetite  Resp:  No shortness of breath with exertion or at rest. No excess mucus, no productive cough, No non-productive cough, No coughing up of blood.No change in color of mucus.No wheezing.No chest wall deformity  Skin:  no rash or lesions.  GU:  no dysuria, change in color of urine, no urgency or frequency. No flank pain.  Musculoskeletal:  No joint pain or swelling. No decreased range of motion. No back pain.  Psych:  No change in mood or affect. No depression or anxiety. No memory loss.   Past Medical History  Diagnosis Date  . Recurrent nephrolithiasis 01/17/2016  . Chronic a-fib (HCC) 01/17/2016  . DM (diabetes mellitus) (HCC) 01/17/2016  . BPH (benign prostatic hyperplasia) 01/17/2016  . History of colon cancer 01/17/2016  . Constipation 01/17/2016  . HTN (hypertension), benign 01/17/2016  . Chronic pancreatitis (HCC) 01/17/2016  . Hyperlipidemia 01/17/2016  . Cardiac arrest (HCC) 01/17/2016  . Chewing tobacco use 01/17/2016   Past Surgical History  Procedure Laterality Date  . Cholecystectomy    . Colon surgery  right hemicolectomy  . Cardiac catheterization      cardiac stents  . Transurethral resection of prostate    . Hernia repair Right 09/03/2014    incisional hernia repair/impantation of  mesh/ventral hernia repair  . Lithotripsy    . Eye surgery      bilateral cataracts   Social History:  reports that he quit smoking about 37 years ago. His smoking use included Cigarettes. His smokeless tobacco use includes Chew. He reports that he does not drink alcohol or use illicit drugs.  Allergies  Allergen Reactions  . Nitroglycerin Anaphylaxis  . Penicillins Anaphylaxis, Hives and Shortness Of Breath    Has patient had a PCN reaction causing immediate rash, facial/tongue/throat swelling, SOB or lightheadedness with hypotension: Yes Has patient had a PCN reaction causing severe rash involving mucus membranes or skin necrosis: Yes Has patient had a PCN reaction that required hospitalization Yes Has patient had a PCN reaction occurring within the last 10 years: No If all of the above answers are "NO", then may proceed with Cephalosporin use.   . Sudafed  [Pseudoephedrine Hcl] Hives  . Tetracycline Shortness Of Breath    Other reaction(s): SHORTNESS OF BREATH  . Vancomycin Hives  . Doxycycline Rash    Other reaction(s): RASH  . Sulfa Antibiotics Rash    Family History  Problem Relation Age of Onset  . Leukemia Father      Prior to Admission medications   Medication Sig Start Date End Date Taking? Authorizing Provider  ALPRAZolam Prudy Feeler) 0.5 MG tablet Take 0.5 mg by mouth 3 (three) times daily as needed for anxiety or sleep.   Yes Historical Provider, MD  amitriptyline (ELAVIL) 25 MG tablet Take 25 mg by mouth at bedtime.   Yes Historical Provider, MD  amLODipine (NORVASC) 2.5 MG tablet Take 2.5 mg by mouth daily.   Yes Historical Provider, MD  atorvastatin (LIPITOR) 20 MG tablet Take 20 mg by mouth daily.   Yes Historical Provider, MD  dutasteride (AVODART) 0.5 MG capsule Take 0.5 mg by mouth daily.   Yes Historical Provider, MD  GARLIC PO Take 1 capsule by mouth daily.   Yes Historical Provider, MD  insulin glargine (LANTUS) 100 UNIT/ML injection Inject into the skin at  bedtime. Inject 35 units daily increase by 2 units daily until fasting sugars are consistently below 160 Yader 50 units per day   Yes Historical Provider, MD  insulin lispro (HUMALOG) 100 UNIT/ML injection Inject 7 Units into the skin 3 (three) times daily before meals.   Yes Historical Provider, MD  lisinopril (PRINIVIL,ZESTRIL) 20 MG tablet Take 20 mg by mouth daily.   Yes Historical Provider, MD  metoprolol (LOPRESSOR) 50 MG tablet Take 50 mg by mouth 2 (two) times daily.   Yes Historical Provider, MD  mineral oil liquid Take 30 mLs by mouth daily as needed for moderate constipation.   Yes Historical Provider, MD  Multiple Vitamin (MULTIVITAMIN WITH MINERALS) TABS tablet Take 1 tablet by mouth daily.   Yes Historical Provider, MD  triamcinolone cream (KENALOG) 0.1 % Apply 1 application topically 2 (two) times daily as needed (rash).    Yes Historical Provider, MD  warfarin (COUMADIN) 5 MG tablet Take 5 mg by mouth as directed. Take 5mg s daily on Tuesday, Thursday, and Saturday and take 2.5mg s daily on all other days of the week   Yes Historical Provider, MD   Physical Exam: Filed Vitals:   01/17/16 1336  BP: 145/51  Pulse: 64  Temp: 97.3  F (36.3 C)  TempSrc: Oral  Resp: 14  Height:  (1.905 m)  Weight: 78.472 kg (173 lb)  SpO2: 100%    Wt Readings from Last 3 Encounters:  01/17/16 78.472 kg (173 lb)    General:   pale. Laying in hospital bed speaking in full sentences in no acute cardiopulmonary distress. Appears calm and comfortable Eyes: PERRLA, EOMI, normal lids, irises & conjunctiva ENT: grossly normal hearing, lips & tongue.dry mucous membranes.  Neck: no LAD, masses or thyromegaly Cardiovascular: RRR, no m/r/g. No LE edema. Respiratory:  some coarse breath sounds in the right base otherwise clear. No crackles, no wheezing. Normal respiratory effort. Abdomen: soft, nd, positive bowel sounds, tender to palpation in the suprapubic region, no rebound, no guarding.  Skin: no  rash or induration seen on limited exam. Scars noted on upper abdomen. Musculoskeletal: grossly normal tone BUE/BLE Psychiatric: grossly normal mood and affect, speech fluent and appropriate Neurologic:  alert and oriented 3. Cranial nerve II through XII grossly intact. No focal deficits.grossly non-focal.          Labs on Admission:  Basic Metabolic Panel: No results for input(s): NA, K, CL, CO2, GLUCOSE, BUN, CREATININE, CALCIUM, MG, PHOS in the last 168 hours. Liver Function Tests: No results for input(s): AST, ALT, ALKPHOS, BILITOT, PROT, ALBUMIN in the last 168 hours. No results for input(s): LIPASE, AMYLASE in the last 168 hours. No results for input(s): AMMONIA in the last 168 hours. CBC: No results for input(s): WBC, NEUTROABS, HGB, HCT, MCV, PLT in the last 168 hours. Cardiac Enzymes: No results for input(s): CKTOTAL, CKMB, CKMBINDEX, TROPONINI in the last 168 hours.  BNP (last 3 results) No results for input(s): BNP in the last 8760 hours.  ProBNP (last 3 results) No results for input(s): PROBNP in the last 8760 hours.  CBG: No results for input(s): GLUCAP in the last 168 hours.  Radiological Exams on Admission: No results found.  EKG: NONE  Assessment/Plan Principal Problem:   ARF (acute renal failure) (HCC) Active Problems:   CAP (community acquired pneumonia)   Metabolic acidosis   Dehydration   Anemia, chronic disease   Hydronephrosis   Recurrent UTI   Recurrent nephrolithiasis   Chronic a-fib (HCC)   DM (diabetes mellitus) (HCC)   BPH (benign prostatic hyperplasia)   Constipation   HTN (hypertension), benign   Hyperlipidemia   Hydronephrosis of right kidney   #1 ACUTE RENAL FAILURE Questionable etiology. Likely secondary to a post renal azotemia as patient was noted to have a right hydronephrosis per CT scan from outside hospital versus prerenal azotemia as patient was noted to have borderline hypotension per daughter on presentation in the setting  of ACE inhibitor. Patient has a Foley catheter in place from outside hospital. Will check a fractional excretion of sodium. Check a UA with cultures and sensitivities. Hold ACE inhibitor. IV fluids. Follow. If no improvement in renal function we'll consult with nephrology. Hold nephrotoxins.  #2 dehydration IV fluids.  #3 community acquired pneumonia Noted on CT and chest x-ray from outside hospital. Patient noted to have a pneumonia in the right lower lobe. Will check a sputum Gram stain and culture. Check a urine Legionella antigen. Check a urine pneumococcus antigen. Placed on oxygen, Mucinex, empiric IV Levaquin. Follow.  #4 right hydronephrosis/probable kidney stones   per CT from outside hospital. Urology consultation pending.  #5 recurrent UTIs Repeat UA with cultures and sensitivities. Patient was being treated with ciprofloxacin prior to admission for urinary tract  infection noted at PCPs office. We'll place on IV Levaquin. Follow.  #6 constipation Chronic in nature per patient. Will give her soapsuds enema. Placed on MiraLAX twice daily. Senokot S twice daily.  #7 diabetes mellitus Check a hemoglobin A1c. We'll place on Lantus 20 units daily. Sliding scale insulin.  #8 BPH Patient status post Foley catheter. Continue Avodart. Urology consultation pending.  #9 atrial fibrillation Continue metoprolol for rate control. Check INR. Coumadin for anticoagulation per pharmacy.  #10 hypertension Continue home regimen of Norvasc and metoprolol. Hold lisinopril secondary to acute renal failure.  #11 hyperlipidemia Continue home regimen Lipitor.  #12 metabolic acidosis Likely secondary to problem #1. Repeat asymmetric metabolic profile. Place on bicarbonate drip.  #13 chronic anemia Patient with no overt bleeding. Check a CBC. Check an anemia panel. Follow H&H. Transfuse for hemoglobin less than 7.  #14 prophylaxis PPI for GI prophylaxis. Check a PT/INR. Coumadin for  anticoagulation per pharmacy.   Code Status: Full DVT Prophylaxis:On coumadin. INR pending. Family Communication: updated patient and daughter at bedside. Disposition Plan: Admit to medsurg  Time spent: 65 mins  [facility] Surgery Center LLCHOMPSON,Talani Brazee MD [facility] Pager [phone]

## 2016-01-17 NOTE — Progress Notes (Signed)
ANTICOAGULATION CONSULT NOTE - Initial Consult  Pharmacy Consult for warfarin Indication: AFib  Allergies  Allergen Reactions  . Nitroglycerin Anaphylaxis  . Penicillins Anaphylaxis, Hives and Shortness Of Breath    Has patient had a PCN reaction causing immediate rash, facial/tongue/throat swelling, SOB or lightheadedness with hypotension: Yes Has patient had a PCN reaction causing severe rash involving mucus membranes or skin necrosis: Yes Has patient had a PCN reaction that required hospitalization Yes Has patient had a PCN reaction occurring within the last 10 years: No If all of the above answers are "NO", then may proceed with Cephalosporin use.   . Sudafed  [Pseudoephedrine Hcl] Hives  . Tetracycline Shortness Of Breath    Other reaction(s): SHORTNESS OF BREATH  . Vancomycin Hives  . Doxycycline Rash    Other reaction(s): RASH  . Sulfa Antibiotics Rash    Patient Measurements: Height: 6\' 3"  (190.5 cm) Weight: 173 lb (78.472 kg) IBW/kg (Calculated) : 84.5   Vital Signs: Temp: 97.3 F (36.3 C) (04/07 1336) Temp Source: Oral (04/07 1336) BP: 145/51 mmHg (04/07 1336) Pulse Rate: 64 (04/07 1336)  Labs: No results for input(s): HGB, HCT, PLT, APTT, LABPROT, INR, HEPARINUNFRC, HEPRLOWMOCWT, CREATININE, CKTOTAL, CKMB, TROPONINI in the last 72 hours.  CrCl cannot be calculated (Patient has no serum creatinine result on file.).   Medical History: Past Medical History  Diagnosis Date  . Recurrent nephrolithiasis 01/17/2016  . Chronic a-fib (HCC) 01/17/2016  . DM (diabetes mellitus) (HCC) 01/17/2016  . BPH (benign prostatic hyperplasia) 01/17/2016  . History of colon cancer 01/17/2016  . Constipation 01/17/2016  . HTN (hypertension), benign 01/17/2016  . Chronic pancreatitis (HCC) 01/17/2016  . Hyperlipidemia 01/17/2016  . Cardiac arrest (HCC) 01/17/2016  . Chewing tobacco use 01/17/2016    Assessment: 81 year old male with a history of anemia, atrial fibrillation on Coumadin,  diabetes mellitus, remote history of colon cancer that presented to the emergency department with an elevated creatinine of 4. Baseline proximal 1.1. Patient found to have questionable urinary tract infection and pneumonia.  Pharmacy consulted to dose warfarin.  Home dose is 5mg  on Tues, Thurs and Saturday and 2.5mg  all other days.  Last dose 4/6.  INR 4.52 ([facility], per care everywhere)  Goal of Therapy:  INR 2-3   Plan:  Hold warfarin dose today Daily INR  Arley Phenixllen Jeptha Hinnenkamp RPh 01/17/2016, 3:16 PM Pager [phone]

## 2016-01-17 NOTE — Consult Note (Signed)
Urology Consult   Physician requesting consult: [name], MD   Reason for consult: AKI and hydronephrosis  History of Present Illness: Paul Guerra is a 81 y.o. with a complex PMH with urologic history including stones, BPH s/p TURP and frequent UTI with no positive urine cultures since TURP. The patient grew pseudomonas, enterococcus and Candida glabrata prior to TURP. The patient was seen at [facility] for recurrent UTIs and a CT scan with thickened bladder wall. He underwent a cystoscopy where several biopsies were done that showed no malignancy. The patient then no-showed for follow up with urology. He recently went to his PCP at [facility] for dysuria and was found to have a creatinine of 4 from a baseline around 1.3, last 1.12 in July 2016. With a suspicious UA, although no apparent urine culture was sent. The patient has been acidotic in the ED but was afebrile.   Past Medical History  Diagnosis Date  . Recurrent nephrolithiasis 01/17/2016  . Chronic a-fib (HCC) 01/17/2016  . DM (diabetes mellitus) (HCC) 01/17/2016  . BPH (benign prostatic hyperplasia) 01/17/2016  . History of colon cancer 01/17/2016  . Constipation 01/17/2016  . HTN (hypertension), benign 01/17/2016  . Chronic pancreatitis (HCC) 01/17/2016  . Hyperlipidemia 01/17/2016  . Cardiac arrest (HCC) 01/17/2016  . Chewing tobacco use 01/17/2016    Past Surgical History  Procedure Laterality Date  . Cholecystectomy    . Colon surgery      right hemicolectomy  . Cardiac catheterization      cardiac stents  . Transurethral resection of prostate    . Hernia repair Right 09/03/2014    incisional hernia repair/impantation of mesh/ventral hernia repair  . Lithotripsy    . Eye surgery      bilateral cataracts    Current Hospital Medications:  Home Meds:    Medication List    ASK your doctor about these medications        ALPRAZolam 0.5 MG tablet  Commonly known as:  XANAX  Take 0.5 mg by mouth 3 (three) times daily as needed for anxiety  or sleep.     amitriptyline 25 MG tablet  Commonly known as:  ELAVIL  Take 25 mg by mouth at bedtime.     amLODipine 2.5 MG tablet  Commonly known as:  NORVASC  Take 2.5 mg by mouth daily.     atorvastatin 20 MG tablet  Commonly known as:  LIPITOR  Take 20 mg by mouth daily.     dutasteride 0.5 MG capsule  Commonly known as:  AVODART  Take 0.5 mg by mouth daily.     GARLIC PO  Take 1 capsule by mouth daily.     insulin glargine 100 UNIT/ML injection  Commonly known as:  LANTUS  Inject into the skin at bedtime. Inject 35 units daily increase by 2 units daily until fasting sugars are consistently below 160 [place] 50 units per day     insulin lispro 100 UNIT/ML injection  Commonly known as:  HUMALOG  Inject 7 Units into the skin 3 (three) times daily before meals.     lisinopril 20 MG tablet  Commonly known as:  PRINIVIL,ZESTRIL  Take 20 mg by mouth daily.     metoprolol 50 MG tablet  Commonly known as:  LOPRESSOR  Take 50 mg by mouth 2 (two) times daily.     mineral oil liquid  Take 30 mLs by mouth daily as needed for moderate constipation.     multivitamin with minerals  Tabs tablet  Take 1 tablet by mouth daily.     triamcinolone cream 0.1 %  Commonly known as:  KENALOG  Apply 1 application topically 2 (two) times daily as needed (rash).     warfarin 5 MG tablet  Commonly known as:  COUMADIN  Take 5 mg by mouth as directed. Take s daily on Tuesday, Thursday, and Saturday and take 2.5mg s daily on all other days of the week        Scheduled Meds: . insulin aspart  0-9 Units Subcutaneous TID WC  . [START ON 01/19/2016] levofloxacin (LEVAQUIN) IV  500 mg Intravenous Q48H  . levofloxacin (LEVAQUIN) IV  750 mg Intravenous Q24H  . metoprolol tartrate  50 mg Oral BID  . [START ON 01/18/2016] pantoprazole  40 mg Oral Q0600  . polyethylene glycol  17 g Oral BID  . senna  1 tablet Oral BID  . sodium chloride  1,000 mL Intravenous Once   Continuous Infusions: .  sodium  bicarbonate 150 mEq in sterile water 1000 mL infusion     PRN Meds:.acetaminophen **OR** acetaminophen, ipratropium, levalbuterol, morphine injection, ondansetron **OR** ondansetron (ZOFRAN) IV, oxyCODONE, sorbitol  Allergies:  Allergies  Allergen Reactions  . Nitroglycerin Anaphylaxis  . Penicillins Anaphylaxis, Hives and Shortness Of Breath    Has patient had a PCN reaction causing immediate rash, facial/tongue/throat swelling, SOB or lightheadedness with hypotension: Yes Has patient had a PCN reaction causing severe rash involving mucus membranes or skin necrosis: Yes Has patient had a PCN reaction that required hospitalization Yes Has patient had a PCN reaction occurring within the last 10 years: No If all of the above answers are "NO", then may proceed with Cephalosporin use.   . Sudafed  [Pseudoephedrine Hcl] Hives  . Tetracycline Shortness Of Breath    Other reaction(s): SHORTNESS OF BREATH  . Vancomycin Hives  . Doxycycline Rash    Other reaction(s): RASH  . Sulfa Antibiotics Rash    Family History  Problem Relation Age of Onset  . Leukemia Father     Social History:  reports that he quit smoking about 37 years ago. His smoking use included Cigarettes. His smokeless tobacco use includes Chew. He reports that he does not drink alcohol or use illicit drugs.  ROS: A complete review of systems was performed.  All systems are negative except for pertinent findings as noted.  Physical Exam:  Vital signs in last 24 hours: Temp:  [97.3 F (36.3 C)] 97.3 F (36.3 C) (04/07 1336) Pulse Rate:  [64] 64 (04/07 1336) Resp:  [14] 14 (04/07 1336) BP: (145)/(51) 145/51 mmHg (04/07 1336) SpO2:  [100 %] 100 % (04/07 1336) Weight:  [78.472 kg (173 lb)] 78.472 kg (173 lb) (04/07 1336) Constitutional:  Alert and oriented, No acute distress Cardiovascular: Regular rate and rhythm, No JVD Respiratory: Normal respiratory effort, Lungs clear bilaterally GI: Abdomen is soft, nontender,  nondistended, no abdominal masses GU: No CVA tenderness Lymphatic: No lymphadenopathy Neurologic: Grossly intact, no focal deficits Psychiatric: Normal mood and affect  Laboratory Data:  No results for input(s): WBC, HGB, HCT, PLT in the last 72 hours.  No results for input(s): NA, K, CL, GLUCOSE, BUN, CALCIUM, CREATININE in the last 72 hours.  Invalid input(s): CO3   No results found for this or any previous visit (from the past 24 hour(s)). No results found for this or any previous visit (from the past 240 hour(s)).  Renal Function: No results for input(s): CREATININE in the last 168 hours. CrCl  cannot be calculated (Patient has no serum creatinine result on file.).  Radiologic Imaging: No results found. CT showed right hydronpehrosis  Ultrasound report from September 2016 says no hydronphrosis.  I independently reviewed the above imaging studies.  Impression/Recommendation 81 y.o. male with AKI and mild right sided hydronephrosis. No apparent stone on CT, possible pyelonephritis with concomitant hydronephrosis. - IV hydration - aggressive bowel regimen - Foley catheter for maximal drainage and exact I&Os - Urine culture - trend creatinine - Would cover yeast with and pseudomonas with antibiotic coverage, previous pseudomonas was cipro resistant. - f/u renal imaging in 48-72 hours  Juliane Pooteter S Greene 01/17/2016, 3:51 PM   I have reviewed the patient's records, films and labs and discussed the case with Dr. Neva SeatGreene.  I have assessed the patient's complaints and done an exam.  He does have mild tenderness of the RUQ and flank.  I agree with the above assessment and plan.  He could have obstruction from fungal elements and if he continues to have any discomfort or no improvement in the Creatinine overnight he will need to have a cystoscopy with right ureteral stenting.    He appears to be impacted and that needs to be addressed as mentioned above.    CC: Dr. Ramiro Harvestaniel Thompson.

## 2016-01-17 NOTE — Telephone Encounter (Signed)
Transfer from [facility], ED- Dr. Marvis MoellerMiles.  This is a 81 year old male with a history of anemia, atrial fibrillation on Coumadin, diabetes mellitus, remote history of colon cancer that presented to the emergency department with an elevated creatinine of 4. Baseline proximal 1.1. Patient found to have questionable urinary tract infection and pneumonia. Given 1 dose of clindamycin and ciprofloxacin. Patient did not have any urine culture obtained. CT stone study did show right hydronephrosis, question long-standing. Also question whether she James obstruction. Dr. Annabell HowellsWrenn, urology, contacted and accepted patient is consulted. Patient also had episode of hypoglycemia which is now resolved. Patient not found to be in sepsis, normal white count with normal lactic acid. Vital signs stable. INR was 4.2. Patient also found to have metabolic acidosis likely secondary to the renal failure. Accepted to medical bed.  Time spent: 15 minutes  Maico Mulvehill D.O. [facility] Pager [phone]  If 7PM-7AM, please contact night-coverage [email or web address] Password [email or web address] 01/17/2016, 10:12 AM

## 2016-01-18 ENCOUNTER — Other Ambulatory Visit: Payer: Self-pay

## 2016-01-18 ENCOUNTER — Inpatient Hospital Stay (HOSPITAL_COMMUNITY): Payer: Medicare Other

## 2016-01-18 DIAGNOSIS — R079 Chest pain, unspecified: Secondary | ICD-10-CM

## 2016-01-18 LAB — PROTIME-INR
INR: 3.99 — ABNORMAL HIGH (ref 0.00–1.49)
Prothrombin Time: 36.8 seconds — ABNORMAL HIGH (ref 11.6–15.2)

## 2016-01-18 LAB — BASIC METABOLIC PANEL
Anion gap: 12 (ref 5–15)
Anion gap: 13 (ref 5–15)
BUN: 73 mg/dL — AB (ref 6–20)
BUN: 66 mg/dL — AB (ref 6–20)
CALCIUM: 7.9 mg/dL — AB (ref 8.9–10.3)
CHLORIDE: 114 mmol/L — AB (ref 101–111)
CHLORIDE: 111 mmol/L (ref 101–111)
CO2: 14 mmol/L — ABNORMAL LOW (ref 22–32)
CO2: 15 mmol/L — AB (ref 22–32)
CREATININE: 3.9 mg/dL — AB (ref 0.61–1.24)
CREATININE: 3.89 mg/dL — AB (ref 0.61–1.24)
Calcium: 8.3 mg/dL — ABNORMAL LOW (ref 8.9–10.3)
GFR calc Af Amer: 16 mL/min — ABNORMAL LOW (ref >60)
GFR calc Af Amer: 16 mL/min — ABNORMAL LOW (ref >60)
GFR, EST NON AFRICAN AMERICAN: 13 mL/min — AB (ref >60)
GFR, EST NON AFRICAN AMERICAN: 14 mL/min — AB (ref >60)
GLUCOSE: 138 mg/dL — AB (ref 65–99)
GLUCOSE: 259 mg/dL — AB (ref 65–99)
Potassium: 3.8 mmol/L (ref 3.5–5.1)
Potassium: 4.1 mmol/L (ref 3.5–5.1)
SODIUM: 140 mmol/L (ref 135–145)
SODIUM: 139 mmol/L (ref 135–145)

## 2016-01-18 LAB — CBC
HCT: 22.2 % — ABNORMAL LOW (ref 39.0–52.0)
Hemoglobin: 7.9 g/dL — ABNORMAL LOW (ref 13.0–17.0)
MCH: 30.3 pg (ref 26.0–34.0)
MCHC: 35.6 g/dL (ref 30.0–36.0)
MCV: 85.1 fL (ref 78.0–100.0)
PLATELETS: 191 10*3/uL (ref 150–400)
RBC: 2.61 MIL/uL — ABNORMAL LOW (ref 4.22–5.81)
RDW: 14 % (ref 11.5–15.5)
WBC: 4.7 10*3/uL (ref 4.0–10.5)

## 2016-01-18 LAB — GLUCOSE, CAPILLARY
GLUCOSE-CAPILLARY: 222 mg/dL — AB (ref 65–99)
Glucose-Capillary: 83 mg/dL (ref 65–99)
Glucose-Capillary: 160 mg/dL — ABNORMAL HIGH (ref 65–99)
Glucose-Capillary: 134 mg/dL — ABNORMAL HIGH (ref 65–99)
Glucose-Capillary: 251 mg/dL — ABNORMAL HIGH (ref 65–99)

## 2016-01-18 LAB — HEMOGLOBIN A1C
Hgb A1c MFr Bld: 9.7 % — ABNORMAL HIGH (ref 4.8–5.6)
MEAN PLASMA GLUCOSE: 232 mg/dL

## 2016-01-18 LAB — TROPONIN I: Troponin I: 0.03 ng/mL (ref <0.031)

## 2016-01-18 LAB — MAGNESIUM: MAGNESIUM: 1.9 mg/dL (ref 1.7–2.4)

## 2016-01-18 LAB — LEGIONELLA PNEUMOPHILA SEROGP 1 UR AG: L. PNEUMOPHILA SEROGP 1 UR AG: NEGATIVE

## 2016-01-18 LAB — HIV ANTIBODY (ROUTINE TESTING W REFLEX): HIV SCREEN 4TH GENERATION: NONREACTIVE

## 2016-01-18 MED ORDER — METOPROLOL TARTRATE 1 MG/ML IV SOLN
5.0000 mg | Freq: Once | INTRAVENOUS | Status: DC
  Filled 2016-01-18: qty 5

## 2016-01-18 MED ORDER — WARFARIN - PHARMACIST DOSING INPATIENT
Freq: Every day | Status: DC
  Administered 2016-01-20: 5
  Administered 2016-01-31 – 2016-02-03 (×2)

## 2016-01-18 MED ORDER — SODIUM CHLORIDE 0.9 % IV BOLUS (SEPSIS)
1000.0000 mL | Freq: Once | INTRAVENOUS | Status: AC
  Administered 2016-01-18: 1000 mL via INTRAVENOUS

## 2016-01-18 MED ORDER — FLUCONAZOLE IN SODIUM CHLORIDE 100-0.9 MG/50ML-% IV SOLN
100.0000 mg | INTRAVENOUS | Status: DC
  Administered 2016-01-18 – 2016-01-22 (×4): 100 mg via INTRAVENOUS
  Filled 2016-01-18 (×6): qty 50

## 2016-01-18 MED ORDER — GI COCKTAIL ~~LOC~~
30.0000 mL | Freq: Three times a day (TID) | ORAL | Status: DC | PRN
  Filled 2016-01-18: qty 30

## 2016-01-18 NOTE — Progress Notes (Signed)
[facility] PROGRESS NOTE  Paul Guerra ZOX:[id number] DOB: 1935/07/29 DOA: 01/17/2016 PCP: No primary care provider on file.  Assessment/Plan: #1 acute renal failure Questionable etiology. Likely secondary to a post renal azotemia as patient noted to have a right hydronephrosis PET/CT scan from outside hospital versus prerenal azotemia as patient noted to have borderline hypotension in the setting of ACE inhibitor. Foley catheter was placed at outside hospital after diagnosis of UTI. Urinalysis with large leukocytes nitrite negative to numerous to count WBCs. Urine cultures pending.FENA = 5.5%. Renal function with no significant improvement since admission and creatinine slightly elevated from admission currently at 3.90 from 3.7 on 01/17/2016. Check a renal ultrasound. Increase bicarbonate 225 mL per hour. Give fluid bolus. Continue empiric IV Levaquin. We'll add Diflucan to cover for yeast as per urology recommendations. If renal function continues to worsen patient may need a cystoscopy with right ureteral stenting per urology. Urology following and appreciate input and recommendations.  #2 metabolic acidosis Likely secondary to problem #1. Increase bicarbonate to 125 mL/h.  #3 chest pain Likely secondary to pneumonia. Patient states chest pain last only 30 seconds with both typical and atypical features. Patient with prior cardiac history. Patient also noted to be in A. fib with heart rate in the 130s. EKG with atrial fibrillation. Cycle cardiac enzymes every 6 hours 3. If cardiac enzymes are negative no further cardiac workup is needed. Continue empiric IV antibiotics and treat pneumonia.  #4 urinary tract infection Urine cultures pending. IV Levaquin.  #5 atrial fibrillation Continue metoprolol for rate control. INR of 3.99 is subtherapeutic. Hold Coumadin.  #6 hypertension Continue Norvasc and metoprolol. Lisinopril on hold.  #7 hyperlipidemia Lipitor.  #8 right  hydronephrosis/probable kidney stones Per CT from outside hospital. Per urology.  #9 constipation Resolved with enema. Continue MiraLAX twice daily as well as Senokot for bowel regimen.  #10 community acquired pneumonia Sputum Gram stain and culture pending. Urine strep pneumococcus antigen negative. Urine Legionella antigen pending. Continue empiric IV Levaquin. Follow.  #11 dehydration IV fluids.  #12 chronic anemia No overt bleeding. Follow H&H. Transfusion threshold hemoglobin less than 7.  #13 BPH Status post Foley catheter placement. Continue Avodart. Per urology.  #14 prophylaxis PPI for GI prophylaxis. INR currently supratherapeutic at 3.99. Will hold Coumadin.  Code Status: Full Family Communication: Updated patient and daughter at bedside. Disposition Plan: Home when medically stable, and renal function close to baseline and clinical improvement.   Consultants:  Urology: Dr.Wrenn  Procedures:  Renal ultrasound 01/18/2016  Chest x-ray 01/17/2016  Antibiotics:  IV Levaquin 01/17/2016  HPI/Subjective: Patient states had a significant bowel movement after enema yesterday. No shortness of breath. Patient denies any active chest pain currently however did tell nurse earlier on that he had some chest pain that lasted only a few seconds. Patient stated that chest pain started while getting a chest x-ray when told to take a deep breath. Patient states chest pain has been intermittent last a few seconds and describes it as a sharp and slight squeezing pain. Patient states occurs occasionally and more chronic in nature. Patient denies any cough. Patient denies any shortness of breath.  Objective: Filed Vitals:   01/18/16 0430 01/18/16 1034  BP: 153/67 141/63  Pulse: 77 130  Temp: 97.9 F (36.6 C) 98.1 F (36.7 C)  Resp: 16     Intake/Output Summary (Last 24 hours) at 01/18/16 1130 Last data filed at 01/18/16 0536  Gross per 24 hour  Intake 3301.1 ml  Output  3100 ml  Net  201.1 ml   Filed Weights   01/17/16 1336 01/18/16 0430  Weight: 78.472 kg (173 lb) 67.1 kg (147 lb 14.9 oz)    Exam:   General:  NAD  Cardiovascular: Irregularly irregular  Respiratory: CTAB  Abdomen: Soft/ND/TTP in suprapubic region,+bs  Musculoskeletal: No c/c/e  Data Reviewed: Basic Metabolic Panel:  Recent Labs Lab 01/17/16 1618 01/18/16 0405  NA 144 140  K 4.5 3.8  CL 115* 114*  CO2 13* 14*  GLUCOSE 60* 138*  BUN 67* 73*  CREATININE 3.70* 3.90*  CALCIUM 8.3* 8.3*  MG 1.6* 1.9  PHOS 7.7*  --    Liver Function Tests:  Recent Labs Lab 01/17/16 1618  AST 43*  ALT 59  ALKPHOS 78  BILITOT 0.5  PROT 6.9  ALBUMIN 3.5   No results for input(s): LIPASE, AMYLASE in the last 168 hours. No results for input(s): AMMONIA in the last 168 hours. CBC:  Recent Labs Lab 01/17/16 1618 01/18/16 0405  WBC 5.9 4.7  NEUTROABS 4.2  --   HGB 8.3* 7.9*  HCT 23.1* 22.2*  MCV 85.6 85.1  PLT 213 191   Cardiac Enzymes: No results for input(s): CKTOTAL, CKMB, CKMBINDEX, TROPONINI in the last 168 hours. BNP (last 3 results) No results for input(s): BNP in the last 8760 hours.  ProBNP (last 3 results) No results for input(s): PROBNP in the last 8760 hours.  CBG:  Recent Labs Lab 01/18/16 0606 01/18/16 0848  GLUCAP 160* 134*    Recent Results (from the past 240 hour(s))  Culture, blood (routine x 2) Call MD if unable to obtain prior to antibiotics being given     Status: None (Preliminary result)   Collection Time: 01/17/16  4:18 PM  Result Value Ref Range Status   Specimen Description BLOOD RIGHT ARM  Final   Special Requests IN PEDIATRIC BOTTLE 2 CC  Final   Culture   Final    NO GROWTH < 24 HOURS Performed at [facility]    Report Status PENDING  Incomplete  Culture, blood (routine x 2) Call MD if unable to obtain prior to antibiotics being given     Status: None (Preliminary result)   Collection Time: 01/17/16  4:37 PM  Result  Value Ref Range Status   Specimen Description BLOOD RIGHT HAND  Final   Special Requests BOTTLES DRAWN AEROBIC ONLY 5 CC  Final   Culture   Final    NO GROWTH < 24 HOURS Performed at [facility]    Report Status PENDING  Incomplete  Urine culture     Status: None (Preliminary result)   Collection Time: 01/17/16  5:14 PM  Result Value Ref Range Status   Specimen Description URINE, RANDOM  Final   Special Requests NONE  Final   Culture   Final    NO GROWTH < 24 HOURS Performed at [facility]    Report Status PENDING  Incomplete     Studies: X-ray Chest Pa And Lateral  01/17/2016  CLINICAL DATA:  Anemia and chronic atrial fibrillation. Acute renal insufficiency. EXAM: CHEST  2 VIEW COMPARISON:  January 16, 2016 FINDINGS: There is persistent patchy infiltrate in the right lower lobe, stable. There is subtle new opacity in the right upper lobe, likely extension of pneumonia. Left lung is clear. Heart size and pulmonary vascularity are normal. No adenopathy. No bone lesions. There is atherosclerotic calcification in the aorta. IMPRESSION: Right lower lobe infiltrate, stable. Subtle patchy  opacity right upper lobe, felt to represent mild extension of pneumonia. Left lung remains clear. No change in cardiac silhouette. Electronically Signed   By: Bretta Bang III M.D.   On: 01/17/2016 16:16    Scheduled Meds: . amitriptyline  25 mg Oral QHS  . amLODipine  2.5 mg Oral Daily  . atorvastatin  20 mg Oral Daily  . dutasteride  0.5 mg Oral Daily  . fluconazole (DIFLUCAN) IV  100 mg Intravenous Q24H  . insulin aspart  0-9 Units Subcutaneous TID WC  . [START ON 01/19/2016] levofloxacin (LEVAQUIN) IV  500 mg Intravenous Q48H  . metoprolol tartrate  50 mg Oral BID  . pantoprazole  40 mg Oral Q0600  . polyethylene glycol  17 g Oral BID  . senna  1 tablet Oral BID   Continuous Infusions: .  sodium bicarbonate 150 mEq in sterile water 1000 mL infusion 125 mL/hr at 01/18/16 6578     Principal Problem:   ARF (acute renal failure) (HCC) Active Problems:   CAP (community acquired pneumonia)   Metabolic acidosis   Dehydration   Anemia, chronic disease   Hydronephrosis   Recurrent UTI   Recurrent nephrolithiasis   Chronic a-fib (HCC)   DM (diabetes mellitus) (HCC)   BPH (benign prostatic hyperplasia)   Constipation   HTN (hypertension), benign   Hyperlipidemia   Hydronephrosis of right kidney   Chest pain    Time spent: 35 mins    [facility] MD [facility] Pager [phone]. If 7PM-7AM, please contact night-coverage at [email or web address], password [facility] 01/18/2016, 11:30 AM  LOS: 1 day

## 2016-01-18 NOTE — Progress Notes (Signed)
ANTICOAGULATION CONSULT NOTE - Follow Up Consult  Pharmacy Consult for Warfarin Indication: atrial fibrillation  Allergies  Allergen Reactions  . Nitroglycerin Anaphylaxis  . Penicillins Anaphylaxis, Hives and Shortness Of Breath    Has patient had a PCN reaction causing immediate rash, facial/tongue/throat swelling, SOB or lightheadedness with hypotension: Yes Has patient had a PCN reaction causing severe rash involving mucus membranes or skin necrosis: Yes Has patient had a PCN reaction that required hospitalization Yes Has patient had a PCN reaction occurring within the last 10 years: No If all of the above answers are "NO", then may proceed with Cephalosporin use.   . Sudafed  [Pseudoephedrine Hcl] Hives  . Tetracycline Shortness Of Breath    Other reaction(s): SHORTNESS OF BREATH  . Vancomycin Hives  . Doxycycline Rash    Other reaction(s): RASH  . Sulfa Antibiotics Rash    Patient Measurements: Height: 6\' 3"  (190.5 cm) Weight: 147 lb 14.9 oz (67.1 kg) IBW/kg (Calculated) : 84.5  Vital Signs: Temp: 97.8 F (36.6 C) (04/08 1315) Temp Source: Oral (04/08 1315) BP: 143/69 mmHg (04/08 1417) Pulse Rate: 118 (04/08 1417)  Labs:  Recent Labs  01/17/16 1618 01/18/16 0405 01/18/16 1116  HGB 8.3* 7.9*  --   HCT 23.1* 22.2*  --   PLT 213 191  --   APTT 45*  --   --   LABPROT 38.7* 36.8*  --   INR 4.27* 3.99*  --   CREATININE 3.70* 3.90* 3.89*  TROPONINI  --   --  <0.03    Estimated Creatinine Clearance: 14.9 mL/min (by C-G formula based on Cr of 3.89).   Assessment: [age] y/oM with PMH of anemia, atrial fibrillation on warfarin, DM, BPH, remote history of colon cancer who is currently admitted for acute renal failure, CAP, and UTI. Pharmacy consulted to assist with dosing warfarin while patient in the hospital. Home dose is 5mg  on Tuesday, Thursday, and Saturday and 2.5mg  on all other days with last dose 4/6.  Today, 01/18/2016:  INR remains supratherapeutic at  3.99  CBC: H/H low at 7.9/22.2, Pltc WNL  No bleeding reported   Major drug interactions: Fluconazole, Levofloxacin  Goal of Therapy:  INR 2-3 Monitor platelets by anticoagulation protocol: Yes   Plan:   Continue to hold warfarin.   Daily PT/INR.  Monitor for s/s of bleeding.   Greer PickerelJigna Starletta Houchin, PharmD, BCPS Pager: [phone] 01/18/2016 2:53 PM

## 2016-01-18 NOTE — Evaluation (Signed)
Physical Therapy Evaluation Patient Details Name: Paul Guerra MRN: [id number] DOB: 1935-01-06 Today's Date: 01/18/2016   History of Present Illness  pt admitted to the hosptial  with acute renal failure  daughter reports he has been having excessive urination.   Clinical Impression  Pt is motivated to get up and walk, but is limited by feelings of 'weakness' that cause him to stay seated.  He will benefit from continued PT to increase actvity tolerance and general strength for him to independent at home.     Follow Up Recommendations Home health PT    Equipment Recommendations  None recommended by PT    Recommendations for Other Services OT consult     Precautions / Restrictions Precautions Precautions: Fall Restrictions Weight Bearing Restrictions: No      Mobility  Bed Mobility Overal bed mobility: Needs Assistance Bed Mobility: Supine to Sit     Supine to sit: Min assist        Transfers Overall transfer level: Needs assistance Equipment used: Rolling walker (2 wheeled) Transfers: Sit to/from Stand Sit to Stand: Min assist         General transfer comment: pt reported he got very weak upon inital standing. and needed to sit in chair.  He did not feel strong enough to progress walking.  BP and HR recorded by RN  after several minutes, pt stood again and  was able to march in place without feeling so weak   Ambulation/Gait                Stairs            Wheelchair Mobility    Modified Rankin (Stroke Patients Only)       Balance Overall balance assessment: Needs assistance (pt states she often comes close to falling ) Sitting-balance support: Single extremity supported;Feet supported Sitting balance-Leahy Scale: Normal Sitting balance - Comments: pt able to sit on edge of bed independently    Standing balance support: Bilateral upper extremity supported Standing balance-Leahy Scale: Good Standing balance comment: both hands on rolling  walker                              Pertinent Vitals/Pain Pain Assessment: No/denies pain    Home Living Family/patient expects to be discharged to:: Private residence Living Arrangements: Children (daughter works during the day she is trying to get day help) Available Help at Discharge: Available PRN/intermittently Type of Home: Mobile home Home Access: Stairs to enter     Home Layout: One level   Additional Comments: daughter wants to have more home health services     Prior Function Level of Independence: Independent with assistive device(s)         Comments: pt uses a rolling  pt was driving     Hand Dominance        Extremity/Trunk Assessment   Upper Extremity Assessment: Defer to OT evaluation           Lower Extremity Assessment: Overall WFL for tasks assessed;Generalized weakness (pt tall with decreased muscle mass)         Communication   Communication: No difficulties  Cognition Arousal/Alertness: Awake/alert Behavior During Therapy: WFL for tasks assessed/performed Overall Cognitive Status: Within Functional Limits for tasks assessed                      General Comments General comments (skin integrity, edema, etc.): pt appears  pale, speech hard to understand at times,     Exercises General Exercises - Lower Extremity Ankle Circles/Pumps: AROM;Both;10 reps;Seated Gluteal Sets: AROM;5 reps;Standing      Assessment/Plan    PT Assessment Patient needs continued PT services  PT Diagnosis Difficulty walking;Abnormality of gait;Generalized weakness   PT Problem List Decreased strength;Decreased activity tolerance;Decreased balance;Decreased mobility  PT Treatment Interventions DME instruction;Gait training;Stair training;Functional mobility training;Therapeutic activities;Therapeutic exercise;Balance training;Neuromuscular re-education;Patient/family education   PT Goals (Current goals can be found in the Care Plan  section) Acute Rehab PT Goals Patient Stated Goal: to go home  PT Goal Formulation: With patient/family Time For Goal Achievement: 02/01/16 Potential to Achieve Goals: Good    Frequency Min 3X/week   Barriers to discharge Decreased caregiver support;Other (comment) (daughter works during the day) daughter requesting [facility] home health services for patient as well as information on sitter services     Co-evaluation               End of Session Equipment Utilized During Treatment: Gait belt Activity Tolerance: Treatment limited secondary to medical complications (Comment) (pt feeling "weak" upon standing) Patient left: in chair;with chair alarm set;with nursing/sitter in room;with family/visitor present Nurse Communication: Mobility status         Time: 1400-1425 PT Time Calculation (min) (ACUTE ONLY): 25 min   Charges:   PT Evaluation $PT Eval Low Complexity: 1 Procedure     PT G Codes:       Teresa K. Manson PasseyBrown, PT   01/18/2016, 2:37 PM

## 2016-01-19 LAB — BASIC METABOLIC PANEL
Anion gap: 12 (ref 5–15)
BUN: 66 mg/dL — ABNORMAL HIGH (ref 6–20)
CALCIUM: 7.9 mg/dL — AB (ref 8.9–10.3)
CO2: 21 mmol/L — AB (ref 22–32)
CREATININE: 3.87 mg/dL — AB (ref 0.61–1.24)
Chloride: 110 mmol/L (ref 101–111)
GFR, EST AFRICAN AMERICAN: 16 mL/min — AB (ref >60)
GFR, EST NON AFRICAN AMERICAN: 14 mL/min — AB (ref >60)
GLUCOSE: 233 mg/dL — AB (ref 65–99)
Potassium: 4 mmol/L (ref 3.5–5.1)
Sodium: 143 mmol/L (ref 135–145)

## 2016-01-19 LAB — CBC
HCT: 20.5 % — ABNORMAL LOW (ref 39.0–52.0)
Hemoglobin: 7.3 g/dL — ABNORMAL LOW (ref 13.0–17.0)
MCH: 30.2 pg (ref 26.0–34.0)
MCHC: 35.6 g/dL (ref 30.0–36.0)
MCV: 84.7 fL (ref 78.0–100.0)
PLATELETS: 174 10*3/uL (ref 150–400)
RBC: 2.42 MIL/uL — ABNORMAL LOW (ref 4.22–5.81)
RDW: 14.2 % (ref 11.5–15.5)
WBC: 5.6 10*3/uL (ref 4.0–10.5)

## 2016-01-19 LAB — GLUCOSE, CAPILLARY
GLUCOSE-CAPILLARY: 486 mg/dL — AB (ref 65–99)
GLUCOSE-CAPILLARY: 245 mg/dL — AB (ref 65–99)
GLUCOSE-CAPILLARY: 279 mg/dL — AB (ref 65–99)
Glucose-Capillary: 262 mg/dL — ABNORMAL HIGH (ref 65–99)

## 2016-01-19 LAB — PROTIME-INR
INR: 2.42 — AB (ref 0.00–1.49)
PROTHROMBIN TIME: 25.3 s — AB (ref 11.6–15.2)

## 2016-01-19 LAB — URINE CULTURE

## 2016-01-19 LAB — GLUCOSE, RANDOM: Glucose, Bld: 229 mg/dL — ABNORMAL HIGH (ref 65–99)

## 2016-01-19 MED ORDER — WARFARIN SODIUM 2.5 MG PO TABS
2.5000 mg | ORAL_TABLET | Freq: Once | ORAL | Status: AC
  Administered 2016-01-19: 2.5 mg via ORAL
  Filled 2016-01-19: qty 1

## 2016-01-19 MED ORDER — INSULIN GLARGINE 100 UNIT/ML ~~LOC~~ SOLN
20.0000 [IU] | Freq: Every day | SUBCUTANEOUS | Status: DC
  Administered 2016-01-19: 20 [IU] via SUBCUTANEOUS
  Filled 2016-01-19 (×2): qty 0.2

## 2016-01-19 NOTE — Progress Notes (Signed)
Patient ID: Paul Guerra, male   DOB: 04-Oct-1935, 81 y.o.   MRN: [id number]    Subjective: CC: right flank pain.  Hx: [name] is feeling better today and voices no specific complaints.   His Cr is stable at 3.87 and the renal US yesterday shows persistent hydro.  His cultures are pending.   ROS:  Review of Systems  Constitutional: Negative for fever and chills.  Gastrointestinal: Negative for nausea and vomiting.  Genitourinary: Negative for flank pain.    Anti-infectives: Anti-infectives    Start     Dose/Rate Route Frequency Ordered Stop   01/19/16 1600  levofloxacin (LEVAQUIN) IVPB 500 mg     500 mg 100 mL/hr over 60 Minutes Intravenous Every 48 hours 01/17/16 1511 01/23/16 1559   01/18/16 0800  fluconazole (DIFLUCAN) IVPB 100 mg     100 mg 50 mL/hr over 60 Minutes Intravenous Every 24 hours 01/18/16 0743     01/17/16 1600  levofloxacin (LEVAQUIN) IVPB 750 mg     750 mg 100 mL/hr over 90 Minutes Intravenous Every 24 hours 01/17/16 1504 01/17/16 1816      Current Facility-Administered Medications  Medication Dose Route Frequency Provider Last Rate Last Dose  . acetaminophen (TYLENOL) tablet 650 mg  650 mg Oral Q6H PRN [name], MD       Or  . acetaminophen (TYLENOL) suppository 650 mg  650 mg Rectal Q6H PRN [name], MD      . ALPRAZolam Prudy Feeler) tablet 0.5 mg  0.5 mg Oral TID PRN [name], MD   0.5 mg at 01/18/16 2126  . amitriptyline (ELAVIL) tablet 25 mg  25 mg Oral QHS [name], MD   25 mg at 01/18/16 2126  . amLODipine (NORVASC) tablet 2.5 mg  2.5 mg Oral Daily Ramiro Harvest V, MD   2.5 mg at 01/18/16 1054  . atorvastatin (LIPITOR) tablet 20 mg  20 mg Oral Daily [name], MD   20 mg at 01/18/16 1055  . dutasteride (AVODART) capsule 0.5 mg  0.5 mg Oral Daily Ramiro Harvest V, MD   0.5 mg at 01/18/16 1055  . fluconazole (DIFLUCAN) IVPB 100 mg  100 mg Intravenous Q24H [name], MD   100 mg at 01/19/16 4098  . gi  cocktail (Maalox,Lidocaine,Donnatal)  30 mL Oral TID PRN [name], MD      . insulin aspart (novoLOG) injection 0-9 Units  0-9 Units Subcutaneous TID WC [name], MD   3 Units at 01/18/16 1725  . ipratropium (ATROVENT) nebulizer solution 0.5 mg  0.5 mg Nebulization Q2H PRN [name], MD      . levalbuterol [facility]) nebulizer solution 0.63 mg  0.63 mg Nebulization Q2H PRN [name], MD      . levofloxacin (LEVAQUIN) IVPB 500 mg  500 mg Intravenous Q48H [name], MD      . metoprolol (LOPRESSOR) tablet 50 mg  50 mg Oral BID [name], MD   50 mg at 01/18/16 2126  . morphine 2 MG/ML injection 1-2 mg  1-2 mg Intravenous Q4H PRN [name], MD      . ondansetron [facility]) tablet 4 mg  4 mg Oral Q6H PRN [name], MD       Or  . ondansetron [facility]) injection 4 mg  4 mg Intravenous Q6H PRN [name], MD      . oxyCODONE (Oxy IR/ROXICODONE) immediate release tablet 5  mg  5 mg Oral Q4H PRN Rodolph Bonganiel Thompson V, MD      . pantoprazole (PROTONIX) EC tablet 40 mg  40 mg Oral Q0600 Rodolph Bonganiel Thompson V, MD   40 mg at 01/19/16 0600  . polyethylene glycol (MIRALAX / GLYCOLAX) packet 17 g  17 g Oral BID Rodolph Bonganiel Thompson V, MD   17 g at 01/18/16 2200  . senna (SENOKOT) tablet 8.6 mg  1 tablet Oral BID Rodolph Bonganiel Thompson V, MD   8.6 mg at 01/18/16 2126  . sodium bicarbonate 150 mEq in sterile water 1,000 mL infusion   Intravenous Continuous Rodolph Bonganiel Thompson V, MD 125 mL/hr at 01/19/16 [phone]    . sorbitol 70 % solution 30 mL  30 mL Oral Daily PRN Ramiro Harvestaniel Thompson V, MD      . triamcinolone cream (KENALOG) 0.1 % 1 application  1 application Topical BID PRN Rodolph Bonganiel Thompson V, MD      . warfarin (COUMADIN) tablet 2.5 mg  2.5 mg Oral ONCE-1800 Rodolph Bonganiel Thompson V, MD      . Warfarin - Pharmacist Dosing Inpatient   Does not apply q1800 Rodolph Bonganiel Thompson V, MD   Stopped at 01/18/16 1800     Objective: Vital signs in last 24 hours: Temp:  [97.4 F (36.3  C)-98.1 F (36.7 C)] 97.7 F (36.5 C) (04/09 0536) Pulse Rate:  [90-130] 99 (04/09 0536) Resp:  [18-20] 18 (04/09 0536) BP: (108-143)/(59-79) 129/69 mmHg (04/09 0536) SpO2:  [100 %] 100 % (04/09 0536) Weight:  [66.815 kg (147 lb 4.8 oz)] 66.815 kg (147 lb 4.8 oz) (04/09 0536)  Intake/Output from previous day: 04/08 0701 - 04/09 0700 In: 4846.4 [P.O.:1675; I.V.:3171.4] Out: 5150 [Urine:5150] Intake/Output this shift:     Physical Exam  Constitutional: He is well-developed, well-nourished, and in no distress.  Cardiovascular: Normal rate and regular rhythm.   Pulmonary/Chest: Effort normal. No respiratory distress.  Abdominal: Soft. There is tenderness (mild right upper quadrant and flank tenderness without rebound or guarding. ).  Vitals reviewed.   Lab Results:   Recent Labs  01/18/16 0405 01/19/16 0358  WBC 4.7 5.6  HGB 7.9* 7.3*  HCT 22.2* 20.5*  PLT 191 174   BMET  Recent Labs  01/18/16 1116 01/19/16 0358 01/19/16 0913  NA 139 143  --   K 4.1 4.0  --   CL 111 110  --   CO2 15* 21*  --   GLUCOSE 259* 233* 229*  BUN 66* 66*  --   CREATININE 3.89* 3.87*  --   CALCIUM 7.9* 7.9*  --    PT/INR  Recent Labs  01/18/16 0405 01/19/16 0358  LABPROT 36.8* 25.3*  INR 3.99* 2.42*   ABG No results for input(s): PHART, HCO3 in the last 72 hours.  Invalid input(s): PCO2, PO2  Studies/Results: X-ray Chest Pa And Lateral  01/17/2016  CLINICAL DATA:  Anemia and chronic atrial fibrillation. Acute renal insufficiency. EXAM: CHEST  2 VIEW COMPARISON:  January 16, 2016 FINDINGS: There is persistent patchy infiltrate in the right lower lobe, stable. There is subtle new opacity in the right upper lobe, likely extension of pneumonia. Left lung is clear. Heart size and pulmonary vascularity are normal. No adenopathy. No bone lesions. There is atherosclerotic calcification in the aorta. IMPRESSION: Right lower lobe infiltrate, stable. Subtle patchy opacity right upper lobe,  felt to represent mild extension of pneumonia. Left lung remains clear. No change in cardiac silhouette. Electronically Signed   By: Bretta BangWilliam  Woodruff III M.D.  On: 01/17/2016 16:16   US Renal  01/18/2016  CLINICAL DATA:  81 year old presenting with acute renal failure. Current history of diabetes and hypertension. Current history of benign prostatic hypertrophy. EXAM: RENAL / URINARY TRACT ULTRASOUND COMPLETE COMPARISON:  CT abdomen and pelvis 01/16/2016.  No prior ultrasound. FINDINGS: Right Kidney: Length: Approximately 10.9 cm. Moderate hydronephrosis as noted on the CT, though no obstructing ureteral calculus was identified on that examination. Well-preserved cortex for age. Normal parenchymal echotexture. No focal parenchymal abnormality. No shadowing calculi. Left Kidney: Length: Approximately 10.0 cm. No hydronephrosis. Well-preserved cortex for age. Normal parenchymal echotexture. No focal parenchymal abnormality. Shadowing calculi arising from the lower pole as noted on the CT. Bladder: Decompressed by Foley catheter. IMPRESSION: 1. Moderate right hydronephrosis as noted on the CT 2 days ago. 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 2. No evidence of left hydronephrosis. 3. Left lower pole renal calculi as noted on CT. 4. Well preserved renal cortex for patient age. Electronically Signed   By: Hulan Saas M.D.   On: 01/18/2016 11:28     Assessment and Plan: Right hydro and renal insufficiency with possible infection.  His Cr has not declined and he still is slightly tender on exam.  Cultures are still pending. He is on warfarin.   I am going to try to get him set up for tomorrow for cystoscopy and right retrograde pyelogram with stent insertion.   I reviewed the risks of the procedure including bleeding, infection, ureteral injury, need for chronic stent changes, thrombotic events and anesthetic complications.   He doesn't need the warfarin stopped  for the procedure.        LOS: 2 days    Anner Crete 01/19/2016 [phone]

## 2016-01-19 NOTE — Progress Notes (Signed)
ANTICOAGULATION CONSULT NOTE - Follow Up Consult  Pharmacy Consult for Warfarin Indication: atrial fibrillation  Allergies  Allergen Reactions  . Nitroglycerin Anaphylaxis  . Penicillins Anaphylaxis, Hives and Shortness Of Breath    Has patient had a PCN reaction causing immediate rash, facial/tongue/throat swelling, SOB or lightheadedness with hypotension: Yes Has patient had a PCN reaction causing severe rash involving mucus membranes or skin necrosis: Yes Has patient had a PCN reaction that required hospitalization Yes Has patient had a PCN reaction occurring within the last 10 years: No If all of the above answers are "NO", then may proceed with Cephalosporin use.   . Sudafed  [Pseudoephedrine Hcl] Hives  . Tetracycline Shortness Of Breath    Other reaction(s): SHORTNESS OF BREATH  . Vancomycin Hives  . Doxycycline Rash    Other reaction(s): RASH  . Sulfa Antibiotics Rash    Patient Measurements: Height: 6\' 3"  (190.5 cm) Weight: 147 lb 4.8 oz (66.815 kg) IBW/kg (Calculated) : 84.5  Vital Signs: Temp: 97.7 F (36.5 C) (04/09 0536) Temp Source: Oral (04/09 0536) BP: 129/69 mmHg (04/09 0536) Pulse Rate: 99 (04/09 0536)  Labs:  Recent Labs  01/17/16 1618 01/18/16 0405 01/18/16 1116 01/18/16 1701 01/18/16 2243 01/19/16 0358  HGB 8.3* 7.9*  --   --   --  7.3*  HCT 23.1* 22.2*  --   --   --  20.5*  PLT 213 191  --   --   --  174  APTT 45*  --   --   --   --   --   LABPROT 38.7* 36.8*  --   --   --  25.3*  INR 4.27* 3.99*  --   --   --  2.42*  CREATININE 3.70* 3.90* 3.89*  --   --  3.87*  TROPONINI  --   --  <0.03 <0.03 0.03  --     Estimated Creatinine Clearance: 14.9 mL/min (by C-G formula based on Cr of 3.87).   Assessment: [age] y/oM with PMH of anemia, atrial fibrillation on warfarin, DM, BPH, remote history of colon cancer who is currently admitted for acute renal failure, CAP, and UTI. Pharmacy consulted to assist with dosing warfarin while patient in the  hospital.   Home dose is 5mg  on Tuesday, Thursday, and Saturday and 2.5mg  on all other days with last dose 4/6.  Today, 01/19/2016:  INR trending down, now in therapeutic range  CBC: H/H low 7.3/20.5, Pltc WNL (Hx chronic anemia)  Spoke with urology and hospitalist - ok to dose warfarin even if possible stenting / procedure in the next few days, no active bleeding per MD   AKI - SCr remains elevated  No bleeding reported   Major drug interactions: Fluconazole, Levofloxacin  Goal of Therapy:  INR 2-3 Monitor platelets by anticoagulation protocol: Yes   Plan:   Resume warfarin at 2.5mg  po x 1 tonight  Daily PT/INR.  Monitor closely for s/s of bleeding.  Haynes Hoehnolleen Charisa Twitty, PharmD, BCPS 01/19/2016, 10:03 AM  Pager: [phone]

## 2016-01-19 NOTE — Progress Notes (Signed)
[facility] PROGRESS NOTE  Paul Guerra WUJ:[id number] DOB: June 15, 1935 DOA: 01/17/2016 PCP: No primary care provider on file.  Assessment/Plan: #1 acute renal failure Questionable etiology. Likely secondary to a post renal azotemia as patient noted to have a right hydronephrosis PET/CT scan from outside hospital versus prerenal azotemia as patient noted to have borderline hypotension in the setting of ACE inhibitor. Foley catheter was placed at outside hospital after diagnosis of UTI. Urinalysis with large leukocytes nitrite negative to numerous to count WBCs. Urine cultures pending.FENA = 5.5%. Renal function with no significant improvement since admission and creatinine slightly elevated from admission currently at 3.87 from 3.90 from 3.7 on 01/17/2016. Renal ultrasound with moderate right hydronephrosis. Continue bicarbonate drip. Continue empiric IV Levaquin and Diflucan to cover for yeast as per urology recommendations. Patient scheduled for cystoscopy with right ureteral stenting per urology tomorrow . Urology following and appreciate input and recommendations.  #2 metabolic acidosis Likely secondary to problem #1. Continue bicarbonate drip and decrease rate to 75 mL per hour.   #3 chest pain Likely secondary to pneumonia. Patient with no further chest pain today. Yesterday patient stated chest pain lasted only 30 seconds with both typical and atypical features. Patient with prior cardiac history. EKG with atrial fibrillation. Cardiac enzymes  negative 3. Patient with no further chest pain. No further cardiac workup needed. Continue empiric IV antibiotics and treat pneumonia.  #4 urinary tract infection Urine cultures pending. IV Levaquin.  #5 atrial fibrillation Continue metoprolol for rate control. INR of 2.42 therapeutic. Coumadin per pharmacy.   #6 hypertension Continue Norvasc and metoprolol. Lisinopril on hold.  #7 hyperlipidemia Lipitor.  #8 right hydronephrosis/probable  kidney stones Per CT from outside hospital. Renal ultrasound with moderate right hydronephrosis and chronic UPJ stenosis. No evidence of left hydronephrosis area and left lower pole renal calculi. Per urology patient for cystoscopy and right retrograde pyelogram with stent insertion scheduled for tomorrow. Per urology.  #9 constipation Resolved with enema. Continue MiraLAX twice daily as well as Senokot for bowel regimen.  #10 community acquired pneumonia Sputum Gram stain and culture pending. Urine strep pneumococcus antigen negative. Urine Legionella antigen pending. Continue empiric IV Levaquin. Follow.  #11 dehydration IV fluids.  #12 chronic anemia No overt bleeding. Follow H&H. Transfusion threshold hemoglobin less than 7.  #13 BPH Status post Foley catheter placement. Continue Avodart. Per urology.  #14 diabetes mellitus Hemoglobin A1c is 9.7. CBGs have ranged from 222-486. Stat Lantus at 20 units daily decreased from home dose as patient acute renal failure and will BNP U after midnight. Continue sliding scale insulin.   #15 prophylaxis PPI for GI prophylaxis. INR currently therapeutic at 2.42. Coumadin per pharmacy.  Code Status: Full Family Communication: Updated patient and daughter at bedside. Disposition Plan: Home when medically stable, and renal function close to baseline and clinical improvement and per urology.   Consultants:  Urology: Dr.Wrenn 01/17/2016  Procedures:  Renal ultrasound 01/18/2016  Chest x-ray 01/17/2016  Renal US 01/18/2016  Antibiotics:  IV Levaquin 01/17/2016  HPI/Subjective: Patient denies any CP. No SOB. Feeling better.  Objective: Filed Vitals:   01/18/16 2105 01/19/16 0536  BP: 108/71 129/69  Pulse: 90 99  Temp: 97.4 F (36.3 C) 97.7 F (36.5 C)  Resp: 18 18    Intake/Output Summary (Last 24 hours) at 01/19/16 1041 Last data filed at 01/19/16 0601  Gross per 24 hour  Intake 4606.4 ml  Output   4400 ml  Net  206.4 ml    [employer]  01/17/16 1336 01/18/16 0430 01/19/16 0536  Weight: 78.472 kg (173 lb) 67.1 kg (147 lb 14.9 oz) 66.815 kg (147 lb 4.8 oz)    Exam:   General:  NAD  Cardiovascular: Irregularly irregular  Respiratory: CTAB  Abdomen: Soft/ND/TTP in suprapubic region,+bs  Musculoskeletal: No c/c/e  Data Reviewed: Basic Metabolic Panel:  Recent Labs Lab 01/17/16 1618 01/18/16 0405 01/18/16 1116 01/19/16 0358 01/19/16 0913  NA 144 140 139 143  --   K 4.5 3.8 4.1 4.0  --   CL 115* 114* 111 110  --   CO2 13* 14* 15* 21*  --   GLUCOSE 60* 138* 259* 233* 229*  BUN 67* 73* 66* 66*  --   CREATININE 3.70* 3.90* 3.89* 3.87*  --   CALCIUM 8.3* 8.3* 7.9* 7.9*  --   MG 1.6* 1.9  --   --   --   PHOS 7.7*  --   --   --   --    Liver Function Tests:  Recent Labs Lab 01/17/16 1618  AST 43*  ALT 59  ALKPHOS 78  BILITOT 0.5  PROT 6.9  ALBUMIN 3.5   No results for input(s): LIPASE, AMYLASE in the last 168 hours. No results for input(s): AMMONIA in the last 168 hours. CBC:  Recent Labs Lab 01/17/16 1618 01/18/16 0405 01/19/16 0358  WBC 5.9 4.7 5.6  NEUTROABS 4.2  --   --   HGB 8.3* 7.9* 7.3*  HCT 23.1* 22.2* 20.5*  MCV 85.6 85.1 84.7  PLT 213 191 174   Cardiac Enzymes:  Recent Labs Lab 01/18/16 1116 01/18/16 1701 01/18/16 2243  TROPONINI <0.03 <0.03 0.03   BNP (last 3 results) No results for input(s): BNP in the last 8760 hours.  ProBNP (last 3 results) No results for input(s): PROBNP in the last 8760 hours.  CBG:  Recent Labs Lab 01/18/16 0606 01/18/16 0848 01/18/16 1145 01/18/16 1633 01/19/16 0758  GLUCAP 160* 134* 251* 222* 486*    Recent Results (from the past 240 hour(s))  Culture, blood (routine x 2) Call MD if unable to obtain prior to antibiotics being given     Status: None (Preliminary result)   Collection Time: 01/17/16  4:18 PM  Result Value Ref Range Status   Specimen Description BLOOD RIGHT ARM  Final   Special Requests IN  PEDIATRIC BOTTLE 2 CC  Final   Culture   Final    NO GROWTH < 24 HOURS Performed at [facility]    Report Status PENDING  Incomplete  Culture, blood (routine x 2) Call MD if unable to obtain prior to antibiotics being given     Status: None (Preliminary result)   Collection Time: 01/17/16  4:37 PM  Result Value Ref Range Status   Specimen Description BLOOD RIGHT HAND  Final   Special Requests BOTTLES DRAWN AEROBIC ONLY 5 CC  Final   Culture   Final    NO GROWTH < 24 HOURS Performed at [facility]    Report Status PENDING  Incomplete  Urine culture     Status: None (Preliminary result)   Collection Time: 01/17/16  5:14 PM  Result Value Ref Range Status   Specimen Description URINE, RANDOM  Final   Special Requests NONE  Final   Culture   Final    NO GROWTH < 24 HOURS Performed at [facility]    Report Status PENDING  Incomplete     Studies: X-ray Chest Pa And Lateral  01/17/2016  CLINICAL DATA:  Anemia and chronic atrial fibrillation. Acute renal insufficiency. EXAM: CHEST  2 VIEW COMPARISON:  January 16, 2016 FINDINGS: There is persistent patchy infiltrate in the right lower lobe, stable. There is subtle new opacity in the right upper lobe, likely extension of pneumonia. Left lung is clear. Heart size and pulmonary vascularity are normal. No adenopathy. No bone lesions. There is atherosclerotic calcification in the aorta. IMPRESSION: Right lower lobe infiltrate, stable. Subtle patchy opacity right upper lobe, felt to represent mild extension of pneumonia. Left lung remains clear. No change in cardiac silhouette. Electronically Signed   By: Bretta BangWilliam  Woodruff III M.D.   On: 01/17/2016 16:16   [place] Renal  01/18/2016  CLINICAL DATA:  81 year old presenting with acute renal failure. Current history of diabetes and hypertension. Current history of benign prostatic hypertrophy. EXAM: RENAL / URINARY TRACT ULTRASOUND COMPLETE COMPARISON:  CT abdomen and pelvis 01/16/2016.   No prior ultrasound. FINDINGS: Right Kidney: Length: Approximately 10.9 cm. Moderate hydronephrosis as noted on the CT, though no obstructing ureteral calculus was identified on that examination. Well-preserved cortex for age. Normal parenchymal echotexture. No focal parenchymal abnormality. No shadowing calculi. Left Kidney: Length: Approximately 10.0 cm. No hydronephrosis. Well-preserved cortex for age. Normal parenchymal echotexture. No focal parenchymal abnormality. Shadowing calculi arising from the lower pole as noted on the CT. Bladder: Decompressed by Foley catheter. IMPRESSION: 1. Moderate right hydronephrosis as noted on the CT 2 days ago. 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 2. No evidence of left hydronephrosis. 3. Left lower pole renal calculi as noted on CT. 4. Well preserved renal cortex for patient age. Electronically Signed   By: Hulan Saashomas  Lawrence M.D.   On: 01/18/2016 11:28    Scheduled Meds: . amitriptyline  25 mg Oral QHS  . amLODipine  2.5 mg Oral Daily  . atorvastatin  20 mg Oral Daily  . dutasteride  0.5 mg Oral Daily  . fluconazole (DIFLUCAN) IV  100 mg Intravenous Q24H  . insulin aspart  0-9 Units Subcutaneous TID WC  . insulin glargine  20 Units Subcutaneous Daily  . levofloxacin (LEVAQUIN) IV  500 mg Intravenous Q48H  . metoprolol tartrate  50 mg Oral BID  . pantoprazole  40 mg Oral Q0600  . polyethylene glycol  17 g Oral BID  . senna  1 tablet Oral BID  . warfarin  2.5 mg Oral ONCE-1800  . Warfarin - Pharmacist Dosing Inpatient   Does not apply q1800   Continuous Infusions: .  sodium bicarbonate 150 mEq in sterile water 1000 mL infusion 125 mL/hr at 01/19/16 0311    Principal Problem:   ARF (acute renal failure) (HCC) Active Problems:   CAP (community acquired pneumonia)   Metabolic acidosis   Dehydration   Anemia, chronic disease   Hydronephrosis   Recurrent UTI   Recurrent nephrolithiasis   Chronic a-fib  (HCC)   DM (diabetes mellitus) (HCC)   BPH (benign prostatic hyperplasia)   Constipation   HTN (hypertension), benign   Hyperlipidemia   Hydronephrosis of right kidney   Chest pain    Time spent: 35 mins    Va [facility] Jersey Health Care SystemHOMPSON,DANIEL MD [facility] Pager [phone]. If 7PM-7AM, please contact night-coverage at [email or web address], password [email or web address] 01/19/2016, 10:41 AM  LOS: 2 days

## 2016-01-19 NOTE — Progress Notes (Signed)
Occupational Therapy Evaluation Patient Details Name: Paul Guerra MRN: [id number] DOB: [birth year] Today's Date: 01/19/2016    History of Present Illness pt admitted to the hosptial  with acute renal failure  daughter reports he has been having excessive urination.    Clinical Impression   Patient presents to OT with decreased ADL independence and safety due to the deficits listed below. He will benefit from skilled OT to maximize function and to facilitate a safe discharge. OT will follow.    Follow Up Recommendations  Home health OT;Supervision/Assistance - 24 hour    Equipment Recommendations  Other (comment) (tbd)    Recommendations for Other Services       Precautions / Restrictions Precautions Precautions: Fall Restrictions Weight Bearing Restrictions: No      Mobility Bed Mobility Overal bed mobility: Needs Assistance Bed Mobility: Supine to Sit     Supine to sit: Supervision;HOB elevated        Transfers Overall transfer level: Needs assistance Equipment used: Rolling walker (2 wheeled) Transfers: Sit to/from [employer] Sit to Stand: Min guard Stand pivot transfers: Min guard       General transfer comment: Reports he feels stronger today.    Balance                                            ADL Overall ADL's : Needs assistance/impaired Eating/Feeding: Independent;Sitting   Grooming: Wash/dry hands;Wash/dry face;Set up;Sitting   Upper Body Bathing: Set up   Lower Body Bathing: Minimal assistance;Moderate assistance   Upper Body Dressing : Set up   Lower Body Dressing: Minimal assistance;Moderate assistance   Toilet Transfer: Min guard;Stand-pivot;BSC;RW           Functional mobility during ADLs: Min guard;Rolling walker General ADL Comments: Patient agreeable to up to recliner. Used RW to transfer. Did well. Per chart pt to have procedure/surgery tomorrow.     Vision     Perception      Praxis      Pertinent Vitals/Pain Pain Assessment: No/denies pain     Hand Dominance Right   Extremity/Trunk Assessment Upper Extremity Assessment Upper Extremity Assessment: Overall WFL for tasks assessed   Lower Extremity Assessment Lower Extremity Assessment: Defer to PT evaluation       Communication Communication Communication: No difficulties   Cognition Arousal/Alertness: Awake/alert Behavior During Therapy: WFL for tasks assessed/performed Overall Cognitive Status: Within Functional Limits for tasks assessed                     General Comments       Exercises       Shoulder Instructions      Home Living Family/patient expects to be discharged to:: Private residence Living Arrangements: Children Available Help at Discharge: Available PRN/intermittently Type of Home: Mobile home Home Access: Stairs to enter     Home Layout: One level     Bathroom Shower/Tub: Chief Strategy Officer: Standard     Home Equipment: Environmental consultant - 2 wheels   Additional Comments: daughter wants to have more home health services       Prior Functioning/Environment Level of Independence: Independent with assistive device(s)        Comments: Uses RW. Patient drives    OT Diagnosis: Generalized weakness   OT Problem List: Decreased strength;Decreased activity tolerance;Impaired balance (sitting and/or standing);Decreased knowledge of use  of DME or AE   OT Treatment/Interventions: Self-care/ADL training;DME and/or AE instruction;Therapeutic activities;Patient/family education    OT Goals(Current goals can be found in the care plan section) Acute Rehab OT Goals Patient Stated Goal: to go home  OT Goal Formulation: With patient Time For Goal Achievement: 02/02/16 Potential to Achieve Goals: Good ADL Goals Pt Will Perform Upper Body Bathing: with supervision;sitting Pt Will Perform Lower Body Bathing: with supervision;sit to/from stand Pt Will Perform  Upper Body Dressing: with supervision;sitting Pt Will Perform Lower Body Dressing: with supervision;sit to/from stand Pt Will Transfer to Toilet: with supervision;ambulating Pt Will Perform Toileting - Clothing Manipulation and hygiene: with supervision;sit to/from stand  OT Frequency: Min 2X/week   Barriers to D/C: Decreased caregiver support  daughter works during the day       Co-evaluation              End of Journalist, newspaperession Equipment Utilized During Treatment: Rolling walker  Activity Tolerance: Patient tolerated treatment well Patient left: in chair;with call bell/phone within reach;with chair alarm set;with family/visitor present   Time: 1030-1046 OT Time Calculation (min): 16 min Charges:  OT General Charges $OT Visit: 1 Procedure OT Evaluation $OT Eval Low Complexity: 1 Procedure G-Codes:    Landree Fernholz A 01/19/2016, 10:53 AM

## 2016-01-20 ENCOUNTER — Inpatient Hospital Stay (HOSPITAL_COMMUNITY): Payer: Medicare Other | Admitting: Anesthesiology

## 2016-01-20 ENCOUNTER — Encounter (HOSPITAL_COMMUNITY)
Admission: AD | Disposition: A | Discharge status: Skilled Nursing Facility | Payer: Self-pay | Source: Other Acute Inpatient Hospital | Attending: Internal Medicine

## 2016-01-20 ENCOUNTER — Encounter (HOSPITAL_COMMUNITY): Payer: Self-pay | Admitting: Anesthesiology

## 2016-01-20 HISTORY — PX: CYSTOSCOPY W/ URETERAL STENT PLACEMENT: SHX1429

## 2016-01-20 LAB — CBC
HCT: 19.3 % — ABNORMAL LOW (ref 39.0–52.0)
HEMOGLOBIN: 6.9 g/dL — AB (ref 13.0–17.0)
MCH: 30 pg (ref 26.0–34.0)
MCHC: 35.8 g/dL (ref 30.0–36.0)
MCV: 83.9 fL (ref 78.0–100.0)
PLATELETS: 154 10*3/uL (ref 150–400)
RBC: 2.3 MIL/uL — AB (ref 4.22–5.81)
RDW: 14 % (ref 11.5–15.5)
WBC: 5.7 10*3/uL (ref 4.0–10.5)

## 2016-01-20 LAB — RENAL FUNCTION PANEL
ALBUMIN: 2.9 g/dL — AB (ref 3.5–5.0)
ANION GAP: 12 (ref 5–15)
BUN: 65 mg/dL — ABNORMAL HIGH (ref 6–20)
CALCIUM: 7.8 mg/dL — AB (ref 8.9–10.3)
CO2: 27 mmol/L (ref 22–32)
CREATININE: 4.1 mg/dL — AB (ref 0.61–1.24)
Chloride: 102 mmol/L (ref 101–111)
GFR, EST AFRICAN AMERICAN: 15 mL/min — AB (ref >60)
GFR, EST NON AFRICAN AMERICAN: 13 mL/min — AB (ref >60)
Glucose, Bld: 137 mg/dL — ABNORMAL HIGH (ref 65–99)
PHOSPHORUS: 5.4 mg/dL — AB (ref 2.5–4.6)
Potassium: 3.8 mmol/L (ref 3.5–5.1)
SODIUM: 141 mmol/L (ref 135–145)

## 2016-01-20 LAB — GLUCOSE, CAPILLARY
GLUCOSE-CAPILLARY: 81 mg/dL (ref 65–99)
Glucose-Capillary: 87 mg/dL (ref 65–99)
Glucose-Capillary: 100 mg/dL — ABNORMAL HIGH (ref 65–99)
Glucose-Capillary: 194 mg/dL — ABNORMAL HIGH (ref 65–99)

## 2016-01-20 LAB — PROTIME-INR
INR: 1.74 — AB (ref 0.00–1.49)
Prothrombin Time: 19.7 seconds — ABNORMAL HIGH (ref 11.6–15.2)

## 2016-01-20 LAB — PREPARE RBC (CROSSMATCH)

## 2016-01-20 LAB — ABO/RH: ABO/RH(D): O POS

## 2016-01-20 SURGERY — CYSTOSCOPY, WITH RETROGRADE PYELOGRAM AND URETERAL STENT INSERTION
Anesthesia: Choice | Site: Ureter | Laterality: Right

## 2016-01-20 MED ORDER — BELLADONNA ALKALOIDS-OPIUM 16.2-60 MG RE SUPP
RECTAL | Status: AC
  Filled 2016-01-20: qty 1

## 2016-01-20 MED ORDER — FENTANYL CITRATE (PF) 100 MCG/2ML IJ SOLN
INTRAMUSCULAR | Status: DC | PRN
  Administered 2016-01-20 (×2): 25 ug via INTRAVENOUS

## 2016-01-20 MED ORDER — ACETAMINOPHEN 325 MG PO TABS: 650.0000 mg | ORAL_TABLET | Freq: Once | ORAL | Status: AC

## 2016-01-20 MED ORDER — PROPOFOL 10 MG/ML IV BOLUS
INTRAVENOUS | Status: DC | PRN
  Administered 2016-01-20: 50 mg via INTRAVENOUS

## 2016-01-20 MED ORDER — SODIUM CHLORIDE 0.9 % IV SOLN
Freq: Once | INTRAVENOUS | Status: AC
  Administered 2016-01-20: 07:00:00 via INTRAVENOUS

## 2016-01-20 MED ORDER — LIDOCAINE HCL 1 % IJ SOLN
INTRAMUSCULAR | Status: AC
  Filled 2016-01-20: qty 20

## 2016-01-20 MED ORDER — LIDOCAINE HCL 1 % IJ SOLN
INTRAMUSCULAR | Status: DC | PRN
  Administered 2016-01-20: 100 mg via INTRADERMAL

## 2016-01-20 MED ORDER — WARFARIN SODIUM 5 MG PO TABS
5.0000 mg | ORAL_TABLET | Freq: Once | ORAL | Status: AC
  Administered 2016-01-20: 5 mg via ORAL
  Filled 2016-01-20: qty 1

## 2016-01-20 MED ORDER — FUROSEMIDE 10 MG/ML IJ SOLN
20.0000 mg | Freq: Once | INTRAMUSCULAR | Status: AC
  Administered 2016-01-20: 20 mg via INTRAVENOUS
  Filled 2016-01-20 (×2): qty 2

## 2016-01-20 MED ORDER — PROPOFOL 10 MG/ML IV BOLUS
INTRAVENOUS | Status: AC
  Filled 2016-01-20: qty 20

## 2016-01-20 MED ORDER — DIPHENHYDRAMINE HCL 25 MG PO CAPS
25.0000 mg | ORAL_CAPSULE | Freq: Once | ORAL | Status: AC
  Administered 2016-01-20: 25 mg via ORAL
  Filled 2016-01-20: qty 1

## 2016-01-20 MED ORDER — FENTANYL CITRATE (PF) 100 MCG/2ML IJ SOLN
25.0000 ug | INTRAMUSCULAR | Status: DC | PRN
  Administered 2016-01-20 (×2): 25 ug via INTRAVENOUS

## 2016-01-20 MED ORDER — LIDOCAINE HCL 2 % EX GEL
CUTANEOUS | Status: AC
  Filled 2016-01-20: qty 5

## 2016-01-20 MED ORDER — FENTANYL CITRATE (PF) 100 MCG/2ML IJ SOLN
INTRAMUSCULAR | Status: AC
  Filled 2016-01-20: qty 2

## 2016-01-20 MED ORDER — IOPAMIDOL (ISOVUE-300) INJECTION 61%
INTRAVENOUS | Status: AC
  Filled 2016-01-20: qty 50

## 2016-01-20 MED ORDER — SODIUM CHLORIDE 0.9 % IV SOLN
Freq: Once | INTRAVENOUS | Status: AC
  Administered 2016-01-20: 250 mL via INTRAVENOUS

## 2016-01-20 MED ORDER — INSULIN GLARGINE 100 UNIT/ML ~~LOC~~ SOLN
10.0000 [IU] | Freq: Every day | SUBCUTANEOUS | Status: DC
  Administered 2016-01-21 – 2016-01-27 (×7): 10 [IU] via SUBCUTANEOUS
  Filled 2016-01-20 (×9): qty 0.1

## 2016-01-20 MED ORDER — LACTATED RINGERS IV SOLN
INTRAVENOUS | Status: DC | PRN
  Administered 2016-01-20: 12:00:00 via INTRAVENOUS

## 2016-01-20 MED ORDER — PHENYLEPHRINE HCL 10 MG/ML IJ SOLN
INTRAMUSCULAR | Status: DC | PRN
  Administered 2016-01-20: 40 ug via INTRAVENOUS

## 2016-01-20 SURGICAL SUPPLY — 14 items
BAG URO CATCHER STRL LF (MISCELLANEOUS) ×3 IMPLANT
CATH INTERMIT  6FR 70CM (CATHETERS) IMPLANT
CLOTH BEACON ORANGE TIMEOUT ST (SAFETY) ×3 IMPLANT
GLOVE BIOGEL M 8.0 STRL (GLOVE) ×3 IMPLANT
GOWN STRL REUS W/ TWL XL LVL3 (GOWN DISPOSABLE) ×1 IMPLANT
GOWN STRL REUS W/TWL LRG LVL3 (GOWN DISPOSABLE) ×3 IMPLANT
GOWN STRL REUS W/TWL XL LVL3 (GOWN DISPOSABLE) ×2
GUIDEWIRE ANG ZIPWIRE 038X150 (WIRE) IMPLANT
GUIDEWIRE STR DUAL SENSOR (WIRE) ×3 IMPLANT
MANIFOLD NEPTUNE II (INSTRUMENTS) ×3 IMPLANT
PACK CYSTO (CUSTOM PROCEDURE TRAY) ×3 IMPLANT
STENT URET 6FRX24 CONTOUR (STENTS) ×3 IMPLANT
TUBING CONNECTING 10 (TUBING) ×2 IMPLANT
TUBING CONNECTING 10' (TUBING) ×1

## 2016-01-20 NOTE — Progress Notes (Signed)
Pt returned from OR alert. Denies any pain. Daughter at bedside. VS stable. Catheter draining light pink urine. Pt received 2nd unit of blood in OR. H/H ordered.

## 2016-01-20 NOTE — Anesthesia Preprocedure Evaluation (Addendum)
Anesthesia Evaluation  Patient identified by MRN, date of birth, ID band Patient awake    Reviewed: Allergy & Precautions, H&P , Patient's Chart, lab work & pertinent test results, reviewed documented beta blocker date and time   Airway Mallampati: II  TM Distance: >3 FB Neck ROM: full    Dental no notable dental hx.    Pulmonary pneumonia, unresolved, former smoker,    Pulmonary exam normal breath sounds clear to auscultation       Cardiovascular hypertension,  Rhythm:regular Rate:Normal     Neuro/Psych    GI/Hepatic   Endo/Other  diabetes  Renal/GU      Musculoskeletal   Abdominal   Peds  Hematology  (+) anemia ,   Anesthesia Other Findings ARF (acute renal failure) (HCC)   CAP (community acquired pneumonia).... Chest clear; breathing well, 98% RA Sat  Metabolic acidosis  Dehydration  Anemia, chronic disease  Hydronephrosis  Recurrent UTI  Recurrent nephrolithiasis  Chronic a-fib (HCC).... PT 19( No SAB.Marland Kitchen. GA only)  DM (diabetes mellitus) (HCC)  BPH (benign prostatic hyperplasia)  History of colon cancer  Constipation  HTN (hypertension), benign  Chronic pancreatitis (HCC)  Hyperlipidemia  Cardiac arrest (HCC)  Chewing tobacco use  Hydronephrosis of right kidney  Chest pain     Reproductive/Obstetrics                           Anesthesia Physical Anesthesia Plan  ASA: III  Anesthesia Plan:    Post-op Pain Management:    Induction: Intravenous  Airway Management Planned: LMA  Additional Equipment:   Intra-op Plan:   Post-operative Plan:   Informed Consent: I have reviewed the patients History and Physical, chart, labs and discussed the procedure including the risks, benefits and alternatives for the proposed anesthesia with the patient or authorized representative who has indicated his/her understanding and acceptance.   Dental Advisory Given  and Dental advisory given  Plan Discussed with: CRNA and Surgeon  Anesthesia Plan Comments: (Discussed GA with LMA, possible sore throat, potential need to switch to ETT, N/V, pulmonary aspiration. Questions answered. )        Anesthesia Quick Evaluation

## 2016-01-20 NOTE — Progress Notes (Signed)
Patient ID: Paul Guerra, male   DOB: July 24, 1935, 81 y.o.   MRN: [id number]  [name] is for cystoscopy with right stenting today.   His Cr continues to rise and is up to 4.1.   His INR is down to 1.74.

## 2016-01-20 NOTE — Op Note (Signed)
Preoperative diagnosis: Acute renal failure, right hydronephrosis  Postoperative diagnosis: Acute renal failure, minimal right pyelocaliectasis  Procedure: Cystoscopy, right retrograde ureteropyelogram, right double-J stent placement-24 cm x 6 [place] without tether, interpretive fluoroscopy  Surgeon: Cortez Steelman  Anesthesia: Gen. with LMA  Complications: None  Drains: 16 French Foley catheter, 24 cm x 6 French contour double-J stent without tether  Indications: 81 year old male with persistent acute renal failure with radiographic evidence of hydronephrosis. He recently grew yeast from his urine. He is being treated with appropriate management. At this point, due to the fact that the patient has not had improvement of his renal insufficiency with conservative management, he presents for cystoscopy, retrograde and stent placement for management of mild to moderate right hydroureteronephrosis. I have discussed the procedure with the patient in the holding area, as well as the risks involved. He desires to proceed.  Findings: Normal bladder with the exception of mild erythema, most likely from placement of Foley catheter. Right ureter was totally normal with the exception of mild narrowing of the right proximal ureter for distance of a proximally 2 cm. Minimal right pyelocaliectasis.  Description of procedure: The patient was properly identified and marked in the holding area. He was then taken to the operating room where general anesthetic was administered with the LMA. He is placed in the dorsolithotomy position. Foley catheter was removed. Genitalia and perineum were prepped and draped. Proper timeout was performed.  A 23 French panendoscope was advanced under direct vision through his urethra with the 12 lens. There were no urethral lesions or strictures. The prostate was nonobstructive. The bladder was then entered and inspected circumferentially. Both ureteral orifices were normal in  configuration and location. Mild erythema of the posterior bladder consistent with Foley catheter placement. There were no papillary are sessile lesions. No stones were present. The right ureteral orifice was then cannulated with a 6 [place] open-ended catheter. Omnipaque was used to perform retrograde ureteropyelogram.  The findings were as dictated above.  Following this, a 0.038 inch sensor-tip guidewire was advanced through the open-ended catheter, with the proximal tip seen in the upper pole calyceal system. The open-ended catheter was then removed, and a 24 cm x 6 French double-J stent was placed over top of the guidewire, advanced with the pusher proximally to the renal pelvis. Following this, the guidewire was removed. Excellent distal and proximal curl was seen using cystoscopic and fluoroscopic guidance, respectively. The bladder was then drained. The scope was removed and a 16 French Foley catheter was then placed.  The patient tolerated procedure well. He was awakened and then taken to the PACU in stable condition.

## 2016-01-20 NOTE — Anesthesia Procedure Notes (Signed)
Procedure Name: LMA Insertion Date/Time: 01/20/2016 1:11 PM Performed by: Donn PieriniUELLETTE, Yocelyn Brocious G Pre-anesthesia Checklist: Patient identified, Emergency Drugs available, Suction available and Patient being monitored Patient Re-evaluated:Patient Re-evaluated prior to inductionOxygen Delivery Method: Simple face mask and Circle system utilized Preoxygenation: Pre-oxygenation with 100% oxygen Intubation Type: IV induction LMA: LMA with gastric port inserted LMA Size: 4.0 Number of attempts: 1

## 2016-01-20 NOTE — Progress Notes (Signed)
ANTICOAGULATION CONSULT NOTE - Follow Up Consult  Pharmacy Consult for Warfarin Indication: atrial fibrillation  Allergies  Allergen Reactions  . Nitroglycerin Anaphylaxis  . Penicillins Anaphylaxis, Hives and Shortness Of Breath    Has patient had a PCN reaction causing immediate rash, facial/tongue/throat swelling, SOB or lightheadedness with hypotension: Yes Has patient had a PCN reaction causing severe rash involving mucus membranes or skin necrosis: Yes Has patient had a PCN reaction that required hospitalization Yes Has patient had a PCN reaction occurring within the last 10 years: No If all of the above answers are "NO", then may proceed with Cephalosporin use.   . Sudafed  [Pseudoephedrine Hcl] Hives  . Tetracycline Shortness Of Breath    Other reaction(s): SHORTNESS OF BREATH  . Vancomycin Hives  . Doxycycline Rash    Other reaction(s): RASH  . Sulfa Antibiotics Rash    Patient Measurements: Height: 6\' 3"[id number]$  (190.5 cm) Weight: 147 lb 4.8 oz (66.815 kg) IBW/kg (Calculated) : 84.5  Vital Signs: Temp: 97.5 F (36.4 C) (04/10 1415) Temp Source: Oral (04/10 1334) BP: 175/72 mmHg (04/10 1415) Pulse Rate: 57 (04/10 1415)  Labs:  Recent Labs  01/17/16 1618 01/18/16 0405 01/18/16 1116 01/18/16 1701 01/18/16 2243 01/19/16 0358 01/20/16 0355  HGB 8.3* 7.9*  --   --   --  7.3* 6.9*  HCT 23.1* 22.2*  --   --   --  20.5* 19.3*  PLT 213 191  --   --   --  174 154  APTT 45*  --   --   --   --   --   --   LABPROT 38.7* 36.8*  --   --   --  25.3* 19.7*  INR 4.27* 3.99*  --   --   --  2.42* 1.74*  CREATININE 3.70* 3.90* 3.89*  --   --  3.87* 4.10*  TROPONINI  --   --  <0.03 <0.03 0.03  --   --     Estimated Creatinine Clearance: 14 mL/min (by C-G formula based on Cr of 4.1).   Assessment: [age] y/oM with PMH of anemia, atrial fibrillation on warfarin, DM, BPH, remote history of colon cancer who is currently admitted for acute renal failure, CAP, and UTI. Pharmacy  consulted to assist with dosing warfarin while patient in the hospital.   Home dose is 5mg  on Tuesday, Thursday, and Saturday and 2.5mg  on all other days with last dose 4/6.  Today, 01/20/2016:  INR trended down to subtherapeutic. As of AM labs, had not responded to 2.5mg  dose yet.   CBC: cont to trend down(from hydration?). No bleeding reported/documented.  Underwent cystoscopy, right retrograde ureteropyelogram, right double-J stent placement. No bleeding noted in procedure note.    AKI - SCr remains elevated(as of AM labs)  Tolerating diet.  Major drug interactions: Fluconazole and Levofloxacin can increase INR response.  Goal of Therapy:  INR 2-3 Monitor platelets by anticoagulation protocol: Yes   Plan:   Give warfarin 5mg  po x 1 tonight.  Daily PT/INR.  Monitor closely for s/s of bleeding.  Charolotte Ekeom Alizia Greif, PharmD, pager [phone]. 01/20/2016,2:38 PM.

## 2016-01-20 NOTE — Progress Notes (Signed)
[facility] PROGRESS NOTE  Paul Guerra:[id number] DOB: [birth year] DOA: 01/17/2016 PCP: No primary care provider on file.  Assessment/Plan: #1 acute renal failure Questionable etiology. Likely secondary to a post renal azotemia as patient noted to have a right hydronephrosis PET/CT scan from outside hospital versus prerenal azotemia as patient noted to have borderline hypotension in the setting of ACE inhibitor. Foley catheter was placed at outside hospital after diagnosis of UTI. Urinalysis with large leukocytes nitrite negative to numerous to count WBCs. Urine cultures pending.FENA = 5.5%. Renal function with no significant improvement since admission and creatinine slightly elevated from admission currently at 4.10 from 3.87 from 3.90 from 3.7 on 01/17/2016. Renal ultrasound with moderate right hydronephrosis. D/C bicarbonate drip. Continue empiric IV Levaquin and Diflucan to cover for yeast as per urology recommendations. Patient scheduled for cystoscopy with right ureteral stenting per urology today . If no improvement in renal function or worsening renal function tomorrow 01/21/2016 will need to consult with nephrology. Urology following and appreciate input and recommendations.  #2 metabolic acidosis Likely secondary to problem #1. D/C bicarbonate drip.   #3 chest pain Likely secondary to pneumonia. Patient with no further chest pain today. On 01/18/2016 patient stated chest pain lasted only 30 seconds with both typical and atypical features. Patient with prior cardiac history. EKG with atrial fibrillation. Cardiac enzymes  negative 3. Patient with no further chest pain. No further cardiac workup needed. Continue empiric IV antibiotics and treat pneumonia.  #4 urinary tract infection/candiduria Urine cultures with greater than 100,000 yeast.  #5 atrial fibrillation Continue metoprolol for rate control. INR of 1.74 subtherapeutic. Coumadin per pharmacy.   #6 hypertension Continue  Norvasc and metoprolol. Lisinopril on hold.  #7 hyperlipidemia Lipitor.  #8 right hydronephrosis/probable kidney stones Per CT from outside hospital. Renal ultrasound with moderate right hydronephrosis and chronic UPJ stenosis. No evidence of left hydronephrosis area and left lower pole renal calculi. Per urology patient for cystoscopy and right retrograde pyelogram with stent insertion scheduled for today. Per urology.  #9 constipation Resolved with enema. Continue MiraLAX twice daily as well as Senokot for bowel regimen.  #10 community acquired pneumonia Sputum Gram stain and culture pending. Urine strep pneumococcus antigen negative. Urine Legionella antigen pending. Continue empiric IV Levaquin. Follow.  #11 dehydration Saline lock IV fluids.   #12 chronic anemia No overt bleeding. Hemoglobin currently at 6.9. Transfuse 2 units packed red blood cells. Follow H&H.   #13 BPH Status post Foley catheter placement. Continue Avodart. Patient for cystoscopy and right stenting today. Per urology.  #14 diabetes mellitus Hemoglobin A1c is 9.7. CBGs have ranged from 81-262. Started Lantus at 20 units daily decreased from home dose as patient acute renal failure. Patient currently nothing by mouth for cystoscopy and stent placement today per urology. Fasting blood glucose of 81 this morning. Decrease Lantus to 10 units daily. Continue sliding scale insulin.   #15 prophylaxis PPI for GI prophylaxis. INR currently subtherapeutic at 1.74. Coumadin per pharmacy.  Code Status: Full Family Communication: Updated patient and daughter at bedside. Disposition Plan: Home when medically stable, and renal function close to baseline and clinical improvement and per urology.   Consultants:  Urology: Dr.Wrenn 01/17/2016  Procedures:  Renal ultrasound 01/18/2016  Chest x-ray 01/17/2016  Renal US 01/18/2016  2 units PRBCs 01/20/2016  Antibiotics:  IV Levaquin 01/17/2016  HPI/Subjective: Patient  sleeping. Arousable. Patient with no overt bleeding.  Objective: Filed Vitals:   01/20/16 0845 01/20/16 0934  BP: 145/62 139/60  Pulse: 67  67  Temp: 99.2 F (37.3 C) 98.2 F (36.8 C)  Resp: 14 14    Intake/Output Summary (Last 24 hours) at 01/20/16 1024 Last data filed at 01/20/16 1000  Gross per 24 hour  Intake 4076.5 ml  Output   3700 ml  Net  376.5 ml   Filed Weights   01/17/16 1336 01/18/16 0430 01/19/16 0536  Weight: 78.472 kg (173 lb) 67.1 kg (147 lb 14.9 oz) 66.815 kg (147 lb 4.8 oz)    Exam:   General:  NAD  Cardiovascular: Irregularly irregular  Respiratory: CTAB anterior lung fields.  Abdomen: Soft/ND/less TTP in suprapubic region,+bs  Musculoskeletal: No c/c/e  Data Reviewed: Basic Metabolic Panel:  Recent Labs Lab 01/17/16 1618 01/18/16 0405 01/18/16 1116 01/19/16 0358 01/19/16 0913 01/20/16 0355  NA 144 140 139 143  --  141  K 4.5 3.8 4.1 4.0  --  3.8  CL 115* 114* 111 110  --  102  CO2 13* 14* 15* 21*  --  27  GLUCOSE 60* 138* 259* 233* 229* 137*  BUN 67* 73* 66* 66*  --  65*  CREATININE 3.70* 3.90* 3.89* 3.87*  --  4.10*  CALCIUM 8.3* 8.3* 7.9* 7.9*  --  7.8*  MG 1.6* 1.9  --   --   --   --   PHOS 7.7*  --   --   --   --  5.4*   Liver Function Tests:  Recent Labs Lab 01/17/16 1618 01/20/16 0355  AST 43*  --   ALT 59  --   ALKPHOS 78  --   BILITOT 0.5  --   PROT 6.9  --   ALBUMIN 3.5 2.9*   No results for input(s): LIPASE, AMYLASE in the last 168 hours. No results for input(s): AMMONIA in the last 168 hours. CBC:  Recent Labs Lab 01/17/16 1618 01/18/16 0405 01/19/16 0358 01/20/16 0355  WBC 5.9 4.7 5.6 5.7  NEUTROABS 4.2  --   --   --   HGB 8.3* 7.9* 7.3* 6.9*  HCT 23.1* 22.2* 20.5* 19.3*  MCV 85.6 85.1 84.7 83.9  PLT 213 191 174 154   Cardiac Enzymes:  Recent Labs Lab 01/18/16 1116 01/18/16 1701 01/18/16 2243  TROPONINI <0.03 <0.03 0.03   BNP (last 3 results) No results for input(s): BNP in the last 8760  hours.  ProBNP (last 3 results) No results for input(s): PROBNP in the last 8760 hours.  CBG:  Recent Labs Lab 01/19/16 0758 01/19/16 1116 01/19/16 1754 01/19/16 2113 01/20/16 0722  GLUCAP 486* 245* 279* 262* 81    Recent Results (from the past 240 hour(s))  Culture, blood (routine x 2) Call MD if unable to obtain prior to antibiotics being given     Status: None (Preliminary result)   Collection Time: 01/17/16  4:18 PM  Result Value Ref Range Status   Specimen Description BLOOD RIGHT ARM  Final   Special Requests IN PEDIATRIC BOTTLE 2 CC  Final   Culture   Final    NO GROWTH 2 DAYS Performed at [facility]    Report Status PENDING  Incomplete  Culture, blood (routine x 2) Call MD if unable to obtain prior to antibiotics being given     Status: None (Preliminary result)   Collection Time: 01/17/16  4:37 PM  Result Value Ref Range Status   Specimen Description BLOOD RIGHT HAND  Final   Special Requests BOTTLES DRAWN AEROBIC ONLY 5 CC  Final  Culture   Final    NO GROWTH 2 DAYS Performed at [facility]    Report Status PENDING  Incomplete  Urine culture     Status: Abnormal   Collection Time: 01/17/16  5:14 PM  Result Value Ref Range Status   Specimen Description URINE, RANDOM  Final   Special Requests NONE  Final   Culture >=100,000 COLONIES/mL YEAST (A)  Final   Report Status 01/19/2016 FINAL  Final     Studies: No results found.  Scheduled Meds: . amitriptyline  25 mg Oral QHS  . amLODipine  2.5 mg Oral Daily  . atorvastatin  20 mg Oral Daily  . dutasteride  0.5 mg Oral Daily  . fluconazole (DIFLUCAN) IV  100 mg Intravenous Q24H  . furosemide  20 mg Intravenous Once  . insulin aspart  0-9 Units Subcutaneous TID WC  . insulin glargine  10 Units Subcutaneous Daily  . levofloxacin (LEVAQUIN) IV  500 mg Intravenous Q48H  . metoprolol tartrate  50 mg Oral BID  . pantoprazole  40 mg Oral Q0600  . polyethylene glycol  17 g Oral BID  . senna  1  tablet Oral BID  . Warfarin - Pharmacist Dosing Inpatient   Does not apply q1800   Continuous Infusions:    Principal Problem:   ARF (acute renal failure) (HCC) Active Problems:   CAP (community acquired pneumonia)   Metabolic acidosis   Dehydration   Anemia, chronic disease   Hydronephrosis   Recurrent UTI   Recurrent nephrolithiasis   Chronic a-fib (HCC)   DM (diabetes mellitus) (HCC)   BPH (benign prostatic hyperplasia)   Constipation   HTN (hypertension), benign   Hyperlipidemia   Hydronephrosis of right kidney   Chest pain    Time spent: 35 mins    [facility] MD [facility] Pager [phone]. If 7PM-7AM, please contact night-coverage at [email or web address], password [facility] 01/20/2016, 10:24 AM  LOS: 3 days

## 2016-01-20 NOTE — Transfer of Care (Signed)
Immediate Anesthesia Transfer of Care Note  Patient: Paul Guerra  Procedure(s) Performed: Procedure(s) with comments: CYSTOSCOPY WITH RETROGRADE PYELOGRAM/URETERAL STENT PLACEMENT (Right) - I am not sure when Dr. Sherron MondayMacDiarmid can do this case.  He is on call for our office on 4/10.    Patient Location: PACU  Anesthesia Type:General  Level of Consciousness: sedated  Airway & Oxygen Therapy: Patient Spontanous Breathing and Patient connected to face mask oxygen  Post-op Assessment: Report given to RN and Post -op Vital signs reviewed and stable  Post vital signs: Reviewed and stable  Last Vitals:  Filed Vitals:   01/20/16 0934 01/20/16 1153  BP: 139/60 139/60  Pulse: 67 67  Temp: 36.8 C   Resp: 14     Complications: No apparent anesthesia complications

## 2016-01-20 NOTE — Care Management Important Message (Signed)
Important Message  Patient Details  Name: Paul LodgeMax B Duerst MRN: [id number] Date of Birth: 05/16/1935   Medicare Important Message Given:  Yes    Haskell FlirtJamison, Pavneet Markwood 01/20/2016, 10:53 AMImportant Message  Patient Details  Name: Paul LodgeMax B Mcmartin MRN: [id number] Date of Birth: 05/16/1935   Medicare Important Message Given:  Yes    Haskell FlirtJamison, Titania Gault 01/20/2016, 10:53 AM

## 2016-01-20 NOTE — Anesthesia Postprocedure Evaluation (Signed)
Anesthesia Post Note  Patient: Paul Guerra  Procedure(s) Performed: Procedure(s) (LRB): CYSTOSCOPY WITH RETROGRADE PYELOGRAM/URETERAL STENT PLACEMENT (Right)  Patient location during evaluation: PACU Anesthesia Type: General Level of consciousness: awake and alert Pain management: pain level controlled Vital Signs Assessment: post-procedure vital signs reviewed and stable Respiratory status: spontaneous breathing, nonlabored ventilation, respiratory function stable and patient connected to nasal cannula oxygen Cardiovascular status: blood pressure returned to baseline and stable Postop Assessment: no signs of nausea or vomiting Anesthetic complications: no    Last Vitals:  Filed Vitals:   01/20/16 1436 01/20/16 1608  BP: 164/66 138/69  Pulse: 56 66  Temp: 36.3 C 36.3 C  Resp: 12 16    Last Pain:  Filed Vitals:   01/20/16 1616  PainSc: Asleep                 [name] EDWARD

## 2016-01-20 NOTE — Progress Notes (Signed)
Inpatient Diabetes Program Recommendations  AACE/ADA: New Consensus Statement on Inpatient Glycemic Control (2015)  Target Ranges:  Prepandial:   less than 140 mg/dL      Peak postprandial:   less than 180 mg/dL (1-2 hours)      Critically ill patients:  140 - 180 mg/dL   Results for Paul Guerra, Paul Guerra (MRN [id number]) as of 01/20/2016 09:26  Ref. Range 01/19/2016 07:58 01/19/2016 11:16 01/19/2016 17:54 01/19/2016 21:13  Glucose-Capillary Latest Ref Range: 65-99 mg/dL 409486 (H) 811245 (H) 914279 (H) 262 (H)   Results for Paul Guerra, Paul Guerra (MRN [id number]) as of 01/20/2016 09:26  Ref. Range 01/20/2016 07:22  Glucose-Capillary Latest Ref Range: 65-99 mg/dL 81     Admit with: Acute Renal Failure  History: DM. Colon Cancer, Chronic Pancreatitis  Home DM Meds: Lantus 35 units daily (increase by 2 units daily until fasting glucose <160 mg/dl- Maximum dose Lantus 50 units daily)       Humalog 7 units tidwc  Current Insulin Orders: Lantus 10 units daily      Novolog Sensitive Correction Scale/ SSI (0-9 units) TID AC      -Note glucose levels extremely elevated yesterday.  Lantus 20 units daily started yesterday at 11am.  -Glucose down to 81 mg/dl this AM.    -Note Lantus dose reduced to 10 units daily today.  Agree.  -Question accuracy of current A1c level of 9.7%.  Note that Hemoglobin down to 8.3 on admission.  -Note that patient NPO today for Cystoscopy with Stent by Urology.     --Will follow patient during hospitalization--  Ambrose FinlandJeannine Johnston Ashauna Bertholf RN, MSN, CDE Diabetes Coordinator Inpatient Glycemic Control Team Team Pager: [phone] (8a-5p)

## 2016-01-20 NOTE — Progress Notes (Signed)
PT Cancellation Note  Patient Details Name: Paul Guerra MRN: [id number] DOB: 15-Feb-1935   Cancelled Treatment:    Reason Eval/Treat Not Completed: Patient at procedure or test/unavailable   Paul Guerra, Paul Guerra 01/20/2016, 12:50 PM Paul KelchKaren Marajade Guerra PT [phone]

## 2016-01-21 LAB — URINE MICROSCOPIC-ADD ON: BACTERIA UA: NONE SEEN

## 2016-01-21 LAB — TYPE AND SCREEN
ABO/RH(D): O POS
ANTIBODY SCREEN: NEGATIVE
UNIT DIVISION: 0
UNIT DIVISION: 0

## 2016-01-21 LAB — DIFFERENTIAL
BASOS ABS: 0 10*3/uL (ref 0.0–0.1)
Basophils Relative: 0 %
Eosinophils Absolute: 0.1 10*3/uL (ref 0.0–0.7)
Eosinophils Relative: 2 %
LYMPHS ABS: 1.4 10*3/uL (ref 0.7–4.0)
LYMPHS PCT: 23 %
MONO ABS: 0.4 10*3/uL (ref 0.1–1.0)
Monocytes Relative: 7 %
NEUTROS ABS: 4.2 10*3/uL (ref 1.7–7.7)
NEUTROS PCT: 68 %

## 2016-01-21 LAB — RENAL FUNCTION PANEL
Albumin: 3.2 g/dL — ABNORMAL LOW (ref 3.5–5.0)
Anion gap: 12 (ref 5–15)
BUN: 67 mg/dL — AB (ref 6–20)
CHLORIDE: 101 mmol/L (ref 101–111)
CO2: 26 mmol/L (ref 22–32)
CREATININE: 4.63 mg/dL — AB (ref 0.61–1.24)
Calcium: 7.9 mg/dL — ABNORMAL LOW (ref 8.9–10.3)
GFR calc Af Amer: 13 mL/min — ABNORMAL LOW (ref >60)
GFR calc non Af Amer: 11 mL/min — ABNORMAL LOW (ref >60)
GLUCOSE: 349 mg/dL — AB (ref 65–99)
POTASSIUM: 4.6 mmol/L (ref 3.5–5.1)
Phosphorus: 6.1 mg/dL — ABNORMAL HIGH (ref 2.5–4.6)
Sodium: 139 mmol/L (ref 135–145)

## 2016-01-21 LAB — GLUCOSE, CAPILLARY
GLUCOSE-CAPILLARY: 238 mg/dL — AB (ref 65–99)
GLUCOSE-CAPILLARY: 278 mg/dL — AB (ref 65–99)
GLUCOSE-CAPILLARY: 171 mg/dL — AB (ref 65–99)
Glucose-Capillary: 262 mg/dL — ABNORMAL HIGH (ref 65–99)
Glucose-Capillary: 272 mg/dL — ABNORMAL HIGH (ref 65–99)
Glucose-Capillary: 131 mg/dL — ABNORMAL HIGH (ref 65–99)

## 2016-01-21 LAB — CBC
HEMATOCRIT: 29.7 % — AB (ref 39.0–52.0)
Hemoglobin: 10.4 g/dL — ABNORMAL LOW (ref 13.0–17.0)
MCH: 29.6 pg (ref 26.0–34.0)
MCHC: 35 g/dL (ref 30.0–36.0)
MCV: 84.6 fL (ref 78.0–100.0)
PLATELETS: 165 10*3/uL (ref 150–400)
RBC: 3.51 MIL/uL — ABNORMAL LOW (ref 4.22–5.81)
RDW: 13.8 % (ref 11.5–15.5)
WBC: 6 10*3/uL (ref 4.0–10.5)

## 2016-01-21 LAB — URINALYSIS, ROUTINE W REFLEX MICROSCOPIC
BILIRUBIN URINE: NEGATIVE
GLUCOSE, UA: 500 mg/dL — AB
KETONES UR: NEGATIVE mg/dL
NITRITE: NEGATIVE
PH: 7 (ref 5.0–8.0)
PROTEIN: 30 mg/dL — AB
Specific Gravity, Urine: 1.013 (ref 1.005–1.030)

## 2016-01-21 LAB — CK: Total CK: 75 U/L (ref 49–397)

## 2016-01-21 LAB — HEMOGLOBIN AND HEMATOCRIT, BLOOD
HCT: 32.4 % — ABNORMAL LOW (ref 39.0–52.0)
Hemoglobin: 11.3 g/dL — ABNORMAL LOW (ref 13.0–17.0)

## 2016-01-21 LAB — URIC ACID: Uric Acid, Serum: 5.6 mg/dL (ref 4.4–7.6)

## 2016-01-21 LAB — PROTIME-INR
INR: 1.59 — AB (ref 0.00–1.49)
PROTHROMBIN TIME: 18.4 s — AB (ref 11.6–15.2)

## 2016-01-21 LAB — LACTATE DEHYDROGENASE: LDH: 211 U/L — ABNORMAL HIGH (ref 98–192)

## 2016-01-21 MED ORDER — WARFARIN SODIUM 5 MG PO TABS
5.0000 mg | ORAL_TABLET | Freq: Once | ORAL | Status: AC
  Administered 2016-01-21: 5 mg via ORAL
  Filled 2016-01-21: qty 1

## 2016-01-21 MED ORDER — LEVOFLOXACIN 500 MG PO TABS: 500.0000 mg | ORAL_TABLET | ORAL | Status: DC

## 2016-01-21 MED ORDER — ENOXAPARIN SODIUM 30 MG/0.3ML ~~LOC~~ SOLN
30.0000 mg | Freq: Every day | SUBCUTANEOUS | Status: DC
  Administered 2016-01-21 – 2016-01-22 (×2): 30 mg via SUBCUTANEOUS
  Filled 2016-01-21 (×2): qty 0.3

## 2016-01-21 NOTE — Care Management Note (Signed)
Case Management Note  Patient Details  Name: Dorann LodgeMax B Krzyzanowski MRN: [id number] Date of Birth: 21-Nov-1934  Subjective/Objective:      81 yo admitted with Acute Renal Failure              Action/Plan: Pt from home with children and plans to return there.  Expected Discharge Date:  01/21/16               Expected Discharge Plan:  Home w Home Health Services  In-House Referral:     Discharge planning Services  CM Consult  Post Acute Care Choice:    Choice offered to:  Patient, Adult Children  DME Arranged:    DME Agency:     HH Arranged:  PT, OT HH Agency:  [facility]  Status of Service:  In process, will continue to follow  Medicare Important Message Given:  Yes Date Medicare IM Given:    Medicare IM give by:    Date Additional Medicare IM Given:    Additional Medicare Important Message give by:     If discussed at Long Length of Stay Meetings, dates discussed:    Additional Comments: PT is recommending HHPT.  Pt states [facility] in the past and would like to use them again.  No additional DME is needed.  This CM called [facility] and faxed order, face to face, and notes to [phone]).  No other CM needs communicated. Bartholome BillCLEMENTS, Woodroe Vogan H, RN 01/21/2016, 11:06 AM [phone]

## 2016-01-21 NOTE — Progress Notes (Signed)
[facility] PROGRESS NOTE  Dorann LodgeMax B Mancia ZOX:[id number] DOB: 1935/06/03 DOA: 01/17/2016 PCP: No primary care provider on file.  Assessment/Plan: #1 acute renal failure Questionable etiology. Likely secondary to a post renal azotemia as patient noted to have a right hydronephrosis PET/CT scan from outside hospital versus prerenal azotemia as patient noted to have borderline hypotension in the setting of ACE inhibitor. Foley catheter was placed at outside hospital after diagnosis of UTI. Urinalysis with large leukocytes nitrite negative to numerous to count WBCs. Urine cultures pending.FENA = 5.5%. Renal function with no significant improvement since admission and creatinine worsening from admission currently at 4.63 from 4.10 from 3.87 from 3.90 from 3.7 on 01/17/2016. Renal ultrasound with moderate right hydronephrosis. D/C bicarbonate drip. Continue empiric Levaquin and Diflucan to cover for yeast as per urology recommendations. Patient s/p cystoscopy with right ureteral stenting 01/20/2016. Patient with worsening renal function and a such will consult with nephrology for further evaluation and recommendations. Urology following.   #2 metabolic acidosis Likely secondary to problem #1. Resolved.   #3 chest pain Likely secondary to pneumonia. Patient with no further chest pain today. On 01/18/2016 patient stated chest pain lasted only 30 seconds with both typical and atypical features. Patient with prior cardiac history. EKG with atrial fibrillation. Cardiac enzymes  negative 3. Patient with no further chest pain. No further cardiac workup needed. Continue empiric antibiotics and treat pneumonia.  #4 urinary tract infection/candiduria Urine cultures with greater than 100,000 yeast.  #5 atrial fibrillation Continue metoprolol for rate control. INR of 1.74 subtherapeutic. Coumadin per pharmacy.   #6 hypertension Continue Norvasc and metoprolol. Lisinopril on hold secondary to worsening renal  function.  #7 hyperlipidemia Lipitor.  #8 right hydronephrosis/probable kidney stones Per CT from outside hospital. Renal ultrasound with moderate right hydronephrosis and chronic UPJ stenosis. No evidence of left hydronephrosis area and left lower pole renal calculi. Patient status post cystoscopy and right retrograde pyelogram with double-J stent placement 01/20/2016. Per urology.  #9 constipation Resolved with enema. Continue MiraLAX twice daily as well as Senokot for bowel regimen.  #10 community acquired pneumonia Sputum Gram stain and culture pending. Urine strep pneumococcus antigen negative. Urine Legionella antigen pending. Change IV Levaquin to oral Levaquin. Follow.  #11 dehydration Saline lock IV fluids.   #12 chronic anemia No overt bleeding. Hemoglobin currently at 10.4 from 6.9 patient status post 2 units packed red blood cells. Follow H&H.   #13 BPH Status post Foley catheter placement. Continue Avodart. Patient s/p cystoscopy and right double-J stent placement 01/20/2016. Per urology.  #14 diabetes mellitus Hemoglobin A1c is 9.7. CBGs have ranged from 171-278. Started Lantus at 20 units daily decreased from home dose as patient acute renal failure. Continue Lantus at 10 units daily. Sliding scale insulin. Titrate as needed.   #15 prophylaxis PPI for GI prophylaxis. INR currently subtherapeutic at 1.59. Coumadin per pharmacy. Will place on prophylactic Lovenox until INR is therapeutic.  Code Status: Full Family Communication: Updated patient. No family at bedside.  Disposition Plan: Home when medically stable, and renal function close to baseline and clinical improvement.   Consultants:  Urology: Dr.Wrenn 01/17/2016  Nephrology: Dr. Darrick Pennaeterding 01/21/2016  Procedures:  Renal ultrasound 01/18/2016  Chest x-ray 01/17/2016  Renal US 01/18/2016  2 units PRBCs 01/20/2016  Cystoscopy, right retrograde ureteropyelogram, right double-J stent placement. Dr.Dahlstedt  01/20/2016  Antibiotics:  IV Levaquin 01/17/2016>>>> 01/21/2016  Oral Levaquin for 12 2017  HPI/Subjective: Patient sleeping. Arousable. Patient with no overt bleeding. No chest pain. No shortness of  breath.  Objective: Filed Vitals:   01/21/16 0624 01/21/16 1310  BP: 148/88 119/65  Pulse: 92 72  Temp: 98.7 F (37.1 C) 98.3 F (36.8 C)  Resp: 18 16    Intake/Output Summary (Last 24 hours) at 01/21/16 1702 Last data filed at 01/21/16 1607  Gross per 24 hour  Intake    960 ml  Output   6400 ml  Net  -5440 ml   Filed Weights   01/18/16 0430 01/19/16 0536 01/21/16 0624  Weight: 67.1 kg (147 lb 14.9 oz) 66.815 kg (147 lb 4.8 oz) 80.513 kg (177 lb 8 oz)    Exam:   General:  NAD  Cardiovascular: Irregularly irregular  Respiratory: CTAB  Abdomen: Soft/ND/NTTP,+bs  Musculoskeletal: No c/c/e  Data Reviewed: Basic Metabolic Panel:  Recent Labs Lab 01/17/16 1618 01/18/16 0405 01/18/16 1116 01/19/16 0358 01/19/16 0913 01/20/16 0355 01/21/16 0347  NA 144 140 139 143  --  141 139  K 4.5 3.8 4.1 4.0  --  3.8 4.6  CL 115* 114* 111 110  --  102 101  CO2 13* 14* 15* 21*  --  27 26  GLUCOSE 60* 138* 259* 233* 229* 137* 349*  BUN 67* 73* 66* 66*  --  65* 67*  CREATININE 3.70* 3.90* 3.89* 3.87*  --  4.10* 4.63*  CALCIUM 8.3* 8.3* 7.9* 7.9*  --  7.8* 7.9*  MG 1.6* 1.9  --   --   --   --   --   PHOS 7.7*  --   --   --   --  5.4* 6.1*   Liver Function Tests:  Recent Labs Lab 01/17/16 1618 01/20/16 0355 01/21/16 0347  AST 43*  --   --   ALT 59  --   --   ALKPHOS 78  --   --   BILITOT 0.5  --   --   PROT 6.9  --   --   ALBUMIN 3.5 2.9* 3.2*   No results for input(s): LIPASE, AMYLASE in the last 168 hours. No results for input(s): AMMONIA in the last 168 hours. CBC:  Recent Labs Lab 01/17/16 1618 01/18/16 0405 01/19/16 0358 01/20/16 0355 01/20/16 1537 01/21/16 0347  WBC 5.9 4.7 5.6 5.7  --  6.0  NEUTROABS 4.2  --   --   --   --  4.2  HGB 8.3* 7.9*  7.3* 6.9* 11.3* 10.4*  HCT 23.1* 22.2* 20.5* 19.3* 32.4* 29.7*  MCV 85.6 85.1 84.7 83.9  --  84.6  PLT 213 191 174 154  --  165   Cardiac Enzymes:  Recent Labs Lab 01/18/16 1116 01/18/16 1701 01/18/16 2243 01/21/16 1419  CKTOTAL  --   --   --  75  TROPONINI <0.03 <0.03 0.03  --    BNP (last 3 results) No results for input(s): BNP in the last 8760 hours.  ProBNP (last 3 results) No results for input(s): PROBNP in the last 8760 hours.  CBG:  Recent Labs Lab 01/20/16 1730 01/20/16 2131 01/20/16 2308 01/21/16 0755 01/21/16 1208  GLUCAP 194* 262* 272* 238* 278*    Recent Results (from the past 240 hour(s))  Culture, blood (routine x 2) Call MD if unable to obtain prior to antibiotics being given     Status: None (Preliminary result)   Collection Time: 01/17/16  4:18 PM  Result Value Ref Range Status   Specimen Description BLOOD RIGHT ARM  Final   Special Requests IN PEDIATRIC BOTTLE 2 CC  Final   Culture   Final    NO GROWTH 4 DAYS Performed at [facility]    Report Status PENDING  Incomplete  Culture, blood (routine x 2) Call MD if unable to obtain prior to antibiotics being given     Status: None (Preliminary result)   Collection Time: 01/17/16  4:37 PM  Result Value Ref Range Status   Specimen Description BLOOD RIGHT HAND  Final   Special Requests BOTTLES DRAWN AEROBIC ONLY 5 CC  Final   Culture   Final    NO GROWTH 4 DAYS Performed at [facility]    Report Status PENDING  Incomplete  Urine culture     Status: Abnormal   Collection Time: 01/17/16  5:14 PM  Result Value Ref Range Status   Specimen Description URINE, RANDOM  Final   Special Requests NONE  Final   Culture >=100,000 COLONIES/mL YEAST (A)  Final   Report Status 01/19/2016 FINAL  Final     Studies: No results found.  Scheduled Meds: . amitriptyline  25 mg Oral QHS  . atorvastatin  20 mg Oral Daily  . dutasteride  0.5 mg Oral Daily  . fluconazole (DIFLUCAN) IV  100 mg  Intravenous Q24H  . insulin aspart  0-9 Units Subcutaneous TID WC  . insulin glargine  10 Units Subcutaneous Daily  . metoprolol tartrate  50 mg Oral BID  . pantoprazole  40 mg Oral Q0600  . polyethylene glycol  17 g Oral BID  . senna  1 tablet Oral BID  . warfarin  5 mg Oral ONCE-1800  . Warfarin - Pharmacist Dosing Inpatient   Does not apply q1800   Continuous Infusions:    Principal Problem:   ARF (acute renal failure) (HCC) Active Problems:   CAP (community acquired pneumonia)   Metabolic acidosis   Dehydration   Anemia, chronic disease   Hydronephrosis   Recurrent UTI   Recurrent nephrolithiasis   Chronic a-fib (HCC)   DM (diabetes mellitus) (HCC)   BPH (benign prostatic hyperplasia)   Constipation   HTN (hypertension), benign   Hyperlipidemia   Hydronephrosis of right kidney   Chest pain    Time spent: 35 mins    [facility] MD [facility] Pager [phone]. If 7PM-7AM, please contact night-coverage at [email or web address], password [facility] 01/21/2016, 5:02 PM  LOS: 4 days

## 2016-01-21 NOTE — Progress Notes (Signed)
ANTICOAGULATION CONSULT NOTE - Follow Up Consult  Pharmacy Consult for Warfarin Indication: atrial fibrillation  Allergies  Allergen Reactions  . Nitroglycerin Anaphylaxis  . Penicillins Anaphylaxis, Hives and Shortness Of Breath    Has patient had a PCN reaction causing immediate rash, facial/tongue/throat swelling, SOB or lightheadedness with hypotension: Yes Has patient had a PCN reaction causing severe rash involving mucus membranes or skin necrosis: Yes Has patient had a PCN reaction that required hospitalization Yes Has patient had a PCN reaction occurring within the last 10 years: No If all of the above answers are "NO", then may proceed with Cephalosporin use.   . Sudafed  [Pseudoephedrine Hcl] Hives  . Tetracycline Shortness Of Breath    Other reaction(s): SHORTNESS OF BREATH  . Vancomycin Hives  . Doxycycline Rash    Other reaction(s): RASH  . Sulfa Antibiotics Rash    Patient Measurements: Height: 6\' 3"  (190.5 cm) Weight: 177 lb 8 oz (80.513 kg) IBW/kg (Calculated) : 84.5  Vital Signs: Temp: 98.7 F (37.1 C) (04/11 0624) Temp Source: Oral (04/11 0624) BP: 148/88 mmHg (04/11 0624) Pulse Rate: 92 (04/11 0624)  Labs:  Recent Labs  01/18/16 1116 01/18/16 1701 01/18/16 2243  01/19/16 0358 01/20/16 0355 01/20/16 1537 01/21/16 0347  HGB  --   --   --   < > 7.3* 6.9* 11.3* 10.4*  HCT  --   --   --   < > 20.5* 19.3* 32.4* 29.7*  PLT  --   --   --   --  174 154  --  165  LABPROT  --   --   --   --  25.3* 19.7*  --  18.4*  INR  --   --   --   --  2.42* 1.74*  --  1.59*  CREATININE 3.89*  --   --   --  3.87* 4.10*  --  4.63*  TROPONINI <0.03 <0.03 0.03  --   --   --   --   --   < > = values in this interval not displayed.  Estimated Creatinine Clearance: 15 mL/min (by C-G formula based on Cr of 4.63).   Assessment: [age] y/oM with PMH of anemia, atrial fibrillation on warfarin, DM, BPH, remote history of colon cancer who is currently admitted for acute renal  failure, CAP, and UTI. Pharmacy consulted to assist with dosing warfarin while patient in the hospital.   Home dose is 5mg  on Tuesday, Thursday, and Saturday and 2.5mg  on all other days with last dose 4/6.  Today, 01/21/2016:  INR trended down to subtherapeutic, now down to 1.59. As of AM labs, had not responded to 5mg  dose yet.   CBC: cont to trend down(from hydration?). No bleeding reported/documented.  Underwent cystoscopy, right retrograde ureteropyelogram, right double-J stent placement. No bleeding noted in procedure note.    AKI - SCr remains elevated(as of AM labs)  Tolerating diet.  Major drug interactions: Fluconazole and Levofloxacin can increase INR response.  Goal of Therapy:  INR 2-3 Monitor platelets by anticoagulation protocol: Yes   Plan:   Give warfarin 5mg  po x 1 tonight.  Daily PT/INR.  Monitor closely for s/s of bleeding.  Adalberto ColeNikola Kahlen Boyde, PharmD, BCPS Pager [phone] 01/21/2016 9:20 AM

## 2016-01-21 NOTE — Consult Note (Signed)
Reason for Consult:AKI ?? Referring Physician: Dr. Estelle June is an 81 y.o. male.  HPI: 81 yr male with extensive PMH including HTN >39yr BPH post TURP, colon CA s/p resx, cardiac arrest in past, recurrent renal stones with lithotripsy x 3 at least, recurrent UTIs, Afib, DM, [place] . Last available was 1.12 from 7/16.  Serial Cr here 3.7 to 3.9 to 4.1 and now 4.6.  Had pyuria, R hydronephrosis and ? pneu on eval. Tx here with ivf with bicarb, R double J placed 4/10.  Cr still rising.  High Fena, has been on Fluconazole, Cipro, levaquin.  Was on Norvasc, lopressor, Lisinopril at home.  [facility] No FH renal dz, no hx inherited eye, hearing , ms skel defects, .  No hx gout. Constitutional: as above, has been weak Eyes: glasses, has had catarracts Ears, nose, mouth, throat, and face: dry mouth Respiratory: negative Cardiovascular: hx Afib, ^ INR Gastrointestinal: constip Genitourinary:as above, some dysuria to start Integument/breast: some spots on arms and legs, ? bites Hematologic/lymphatic: anemia Musculoskeletal:arthritis of hands, feet, knees Neurological: negative Endocrine: DM Allergic/Immunologic: NTG, Pen, tcn, Vanc  Sulfa, Pseudofed  Past Medical History  Diagnosis Date  . Recurrent nephrolithiasis 01/17/2016  . [place] 01/17[place]  . DM ([place]abetes [place]) [place] 01/17/[place]  . BPH (benign prostatic hyperplasia) 01/17/2016  . History of colon cancer 01/17/2016  . Constipation 01/17/2016  . HTN (hypertension), benign 01/17/2016  . Chronic pancreatitis ([facility] 01/17/2016  . Hyperlipidemia 01/17/2016  . [place] 01/17/2016  . Chewing tobacco use 01/17/2016    Past Surgical History  Procedure Laterality Date  . Cholecystectomy    . Colon surgery      right hemicolectomy  . Cardiac catheterization      cardiac stents  . Transurethral resection of prostate    . Hernia repair Right  09/03/2014    incisional hernia repair/impantation of mesh/ventral hernia repair  . Lithotripsy    . Eye surgery      bilateral cataracts  . Cystoscopy w/ ureteral stent placement Right 01/20/2016    Procedure: CYSTOSCOPY WITH RETROGRADE PYELOGRAM/URETERAL STENT PLACEMENT;  Surgeon: SFranchot Gallo MD;  Location: [facility];  Service: Urology;  Laterality: Right;    Family History  Problem Relation Age of Onset  . Leukemia Father     Social History:  reports that he quit smoking about 37 years ago. His smoking use included Cigarettes. His smokeless tobacco use includes Chew. He reports that he does not drink alcohol or use illicit drugs.  Allergies:  Allergies  Allergen Reactions  . Nitroglycerin Anaphylaxis  . Penicillins Anaphylaxis, Hives and Shortness Of Breath    Has patient had a PCN reaction causing immediate rash, facial/tongue/throat swelling, SOB or lightheadedness with hypotension: Yes Has patient had a PCN reaction causing severe rash involving mucus membranes or skin necrosis: Yes Has patient had a PCN reaction that required hospitalization Yes Has patient had a PCN reaction occurring within the last 10 years: No If all of the above answers are "NO", then may proceed with Cephalosporin use.   . Sudafed  [Pseudoephedrine Hcl] Hives  . Tetracycline Shortness Of Breath    Other reaction(s): SHORTNESS OF BREATH  . Vancomycin Hives  . Doxycycline Rash    Other reaction(s): RASH  . Sulfa Antibiotics Rash    Medications:  I have reviewed the patient's current medications. Prior to Admission:  Prescriptions prior to admission  Medication Sig Dispense Refill Last Dose  . ALPRAZolam (XANAX) 0.5 MG tablet Take 0.5 mg by mouth 3 (three) times daily as needed for anxiety or sleep.   01/15/2016 at Unknown time  . amitriptyline (ELAVIL) 25 MG tablet Take 25 mg by mouth at bedtime.   01/15/2016  . amLODipine (NORVASC) 2.5 MG tablet Take 2.5 mg by mouth daily.   01/16/2016 at Unknown  time  . atorvastatin (LIPITOR) 20 MG tablet Take 20 mg by mouth daily.   01/16/2016 at Unknown time  . dutasteride (AVODART) 0.5 MG capsule Take 0.5 mg by mouth daily.   01/16/2016 at Unknown time  . GARLIC PO Take 1 capsule by mouth daily.   01/16/2016 at Unknown time  . insulin glargine (LANTUS) 100 UNIT/ML injection Inject into the skin at bedtime. Inject 35 units daily increase by 2 units daily until fasting sugars are consistently below 160 [place] 50 units per day   01/16/2016 at Unknown time  . insulin lispro (HUMALOG) 100 UNIT/ML injection Inject 7 Units into the skin 3 (three) times daily before meals.   01/16/2016 at Unknown time  . lisinopril (PRINIVIL,ZESTRIL) 20 MG tablet Take 20 mg by mouth daily.   01/16/2016 at Unknown time  . metoprolol (LOPRESSOR) 50 MG tablet Take 50 mg by mouth 2 (two) times daily.   01/17/2016 at 0900  . mineral oil liquid Take 30 mLs by mouth daily as needed for moderate constipation.   Past Week at Unknown time  . Multiple Vitamin (MULTIVITAMIN WITH MINERALS) TABS tablet Take 1 tablet by mouth daily.   01/16/2016 at Unknown time  . triamcinolone cream (KENALOG) 0.1 % Apply 1 application topically 2 (two) times daily as needed (rash).    unknown  . warfarin (COUMADIN) 5 MG tablet Take 5 mg by mouth as directed. Take 30ms daily on Tuesday, Thursday, and Saturday and take 2.54m daily on all other days of the week   01/16/2016 at 1030     Results for orders placed or performed during the hospital encounter of 01/17/16 (from the past 48 hour(s))  Glucose, capillary     Status: Abnormal   Collection Time: 01/19/16  5:54 PM  Result Value Ref Range   Glucose-Capillary 279 (H) 65 - 99 mg/dL   Comment 1 Notify RN    Comment 2 Document in Chart   Glucose, capillary     Status: Abnormal   Collection Time: 01/19/16  9:13 PM  Result Value Ref Range   Glucose-Capillary 262 (H) 65 - 99 mg/dL  Protime-INR     Status: Abnormal   Collection Time: 01/20/16  3:55 AM  Result Value Ref Range    Prothrombin Time 19.7 (H) 11.6 - 15.2 seconds   INR 1.74 (H) 0.00 - 1.49  Renal function panel     Status: Abnormal   Collection Time: 01/20/16  3:55 AM  Result Value Ref Range   Sodium 141 135 - 145 mmol/L   Potassium 3.8 3.5 - 5.1 mmol/L   Chloride 102 101 - 111 mmol/L   CO2 27 22 - 32 mmol/L   Glucose, Bld 137 (H) 65 - 99 mg/dL   BUN 65 (H) 6 - 20 mg/dL   Creatinine, Ser 4.10 (H) 0.61 - 1.24 mg/dL   Calcium 7.8 (L) 8.9 - 10.3 mg/dL   Phosphorus 5.4 (H) 2.5 - 4.6 mg/dL   Albumin 2.9 (L) 3.5 - 5.0 g/dL   GFR calc non Af Amer 13 (L) >60  mL/min   GFR calc Af Amer 15 (L) >60 mL/min    Comment: (NOTE) The eGFR has been calculated using the CKD EPI equation. This calculation has not been validated in all clinical situations. eGFR's persistently <60 mL/min signify possible Chronic Kidney Disease.    Anion gap 12 5 - 15  CBC     Status: Abnormal   Collection Time: 01/20/16  3:55 AM  Result Value Ref Range   WBC 5.7 4.0 - 10.5 K/uL   RBC 2.30 (L) 4.22 - 5.81 MIL/uL   Hemoglobin 6.9 (LL) 13.0 - 17.0 g/dL    Comment: REPEATED TO VERIFY CRITICAL RESULT CALLED TO, READ BACK BY AND VERIFIED WITH: CLARK RN AT 0430 ON 04.10.17 BY SHUEA    HCT 19.3 (L) 39.0 - 52.0 %   MCV 83.9 78.0 - 100.0 fL   MCH 30.0 26.0 - 34.0 pg   MCHC 35.8 30.0 - 36.0 g/dL   RDW 14.0 11.5 - 15.5 %   Platelets 154 150 - 400 K/uL  Prepare RBC     Status: None   Collection Time: 01/20/16  5:30 AM  Result Value Ref Range   Order Confirmation ORDER PROCESSED BY BLOOD BANK   Type and screen [place]     Status: None   Collection Time: 01/20/16  5:40 AM  Result Value Ref Range   ABO/RH(D) O POS    Antibody Screen NEG    Sample Expiration 01/23/2016    Unit Number [id number]    Blood Component Type RED CELLS,LR    Unit division 00    Status of Unit ISSUED,FINAL    Transfusion Status OK TO TRANSFUSE    Crossmatch Result Compatible    Unit Number [id number]    Blood Component Type  RED CELLS,LR    Unit division 00    Status of Unit ISSUED,FINAL    Transfusion Status OK TO TRANSFUSE    Crossmatch Result Compatible   ABO/Rh     Status: None   Collection Time: 01/20/16  5:47 AM  Result Value Ref Range   ABO/RH(D) O POS   Glucose, capillary     Status: None   Collection Time: 01/20/16  7:22 AM  Result Value Ref Range   Glucose-Capillary 81 65 - 99 mg/dL   Comment 1 Notify RN    Comment 2 Document in Chart   Prepare RBC     Status: None   Collection Time: 01/20/16  8:00 AM  Result Value Ref Range   Order Confirmation ORDER PROCESSED BY BLOOD BANK   Glucose, capillary     Status: None   Collection Time: 01/20/16 11:54 AM  Result Value Ref Range   Glucose-Capillary 87 65 - 99 mg/dL   Comment 1 Notify RN    Comment 2 Document in Chart   Glucose, capillary     Status: Abnormal   Collection Time: 01/20/16  1:43 PM  Result Value Ref Range   Glucose-Capillary 100 (H) 65 - 99 mg/dL  Hemoglobin and hematocrit, blood     Status: Abnormal   Collection Time: 01/20/16  3:37 PM  Result Value Ref Range   Hemoglobin 11.3 (L) 13.0 - 17.0 g/dL    Comment: POST TRANSFUSION SPECIMEN DELTA CHECK NOTED    HCT 32.4 (L) 39.0 - 52.0 %  Glucose, capillary     Status: Abnormal   Collection Time: 01/20/16  5:30 PM  Result Value Ref Range   Glucose-Capillary 194 (H) 65 - 99 mg/dL  Comment 1 Notify RN    Comment 2 Document in Chart   Glucose, capillary     Status: Abnormal   Collection Time: 01/20/16  9:31 PM  Result Value Ref Range   Glucose-Capillary 262 (H) 65 - 99 mg/dL  Glucose, capillary     Status: Abnormal   Collection Time: 01/20/16 11:08 PM  Result Value Ref Range   Glucose-Capillary 272 (H) 65 - 99 mg/dL  Protime-INR     Status: Abnormal   Collection Time: 01/21/16  3:47 AM  Result Value Ref Range   Prothrombin Time 18.4 (H) 11.6 - 15.2 seconds   INR 1.59 (H) 0.00 - 1.49  Renal function panel     Status: Abnormal   Collection Time: 01/21/16  3:47 AM  Result  Value Ref Range   Sodium 139 135 - 145 mmol/L   Potassium 4.6 3.5 - 5.1 mmol/L    Comment: DELTA CHECK NOTED REPEATED TO VERIFY NO VISIBLE HEMOLYSIS    Chloride 101 101 - 111 mmol/L   CO2 26 22 - 32 mmol/L   Glucose, Bld 349 (H) 65 - 99 mg/dL   BUN 67 (H) 6 - 20 mg/dL   Creatinine, Ser 4.63 (H) 0.61 - 1.24 mg/dL   Calcium 7.9 (L) 8.9 - 10.3 mg/dL   Phosphorus 6.1 (H) 2.5 - 4.6 mg/dL   Albumin 3.2 (L) 3.5 - 5.0 g/dL   GFR calc non Af Amer 11 (L) >60 mL/min   GFR calc Af Amer 13 (L) >60 mL/min    Comment: (NOTE) The eGFR has been calculated using the CKD EPI equation. This calculation has not been validated in all clinical situations. eGFR's persistently <60 mL/min signify possible Chronic Kidney Disease.    Anion gap 12 5 - 15  CBC     Status: Abnormal   Collection Time: 01/21/16  3:47 AM  Result Value Ref Range   WBC 6.0 4.0 - 10.5 K/uL   RBC 3.51 (L) 4.22 - 5.81 MIL/uL   Hemoglobin 10.4 (L) 13.0 - 17.0 g/dL   HCT 29.7 (L) 39.0 - 52.0 %   MCV 84.6 78.0 - 100.0 fL   MCH 29.6 26.0 - 34.0 pg   MCHC 35.0 30.0 - 36.0 g/dL   RDW 13.8 11.5 - 15.5 %   Platelets 165 150 - 400 K/uL  Glucose, capillary     Status: Abnormal   Collection Time: 01/21/16  7:55 AM  Result Value Ref Range   Glucose-Capillary 238 (H) 65 - 99 mg/dL   Comment 1 Notify RN    Comment 2 Document in Chart   Glucose, capillary     Status: Abnormal   Collection Time: 01/21/16 12:08 PM  Result Value Ref Range   Glucose-Capillary 278 (H) 65 - 99 mg/dL   Comment 1 Notify RN    Comment 2 Document in Chart     No results found.  ROS Blood pressure 119/65, pulse 72, temperature 98.3 F (36.8 C), temperature source Oral, resp. rate 16, height 6' 3"  (1.905 m), weight 80.513 kg (177 lb 8 oz), SpO2 96 %. Physical Exam Physical Examination: General appearance - oriented to person, place, and time and pale, chronically ill Mental status - alert, oriented to person, place, and time, normal mood, behavior, speech,  dress, motor activity, and thought processes Eyes - pupils equal and reactive, extraocular eye movements intact, funduscopic exam normal, discs flat and sharp Mouth - dry tongue Neck - adenopathy noted PCL Lymphatics - posterior cervical nodes Chest -  rales noted l base Heart - reg, Gr 2/6 holosys M Abdomen - soft, nontender, nondistended, no masses or organomegaly GU Male - foley Musculoskeletal - arthritic changes hand and knees Extremities - peripheral pulses normal, no pedal edema, no clubbing or cyanosis Skin - pale, few bruises, some punctate lesions on arms.  Assessment/Plan: 1 AKI most likely hemodynamic ATN with ACEI and low bps, would explain, course, FENA.  Need to consider ongoing obstruction, or AIN with Quinolones.  Vol , acid/base/K ok. 2 R hydro repeat U/S in 24 h 3 Hypertension: not and issue 4. Anemia not explained 5. Metabolic Bone Disease: eval 6 Recurrent stones  ? Past w/u 7 Afib NSR now 8 ??? pneu limit AB 9 Yeast UTI on Fluc P UA, urine eos, stop bp meds, LDH, CK, Uric acid  Cheryal Salas L 01/21/2016, 1:58 PM      Serial

## 2016-01-21 NOTE — Progress Notes (Signed)
Inpatient Diabetes Program Recommendations  AACE/ADA: New Consensus Statement on Inpatient Glycemic Control (2015)  Target Ranges:  Prepandial:   less than 140 mg/dL      Peak postprandial:   less than 180 mg/dL (1-2 hours)      Critically ill patients:  140 - 180 mg/dL   Results for Paul Guerra, Paul Guerra (MRN [id number]) as of 01/21/2016 09:02  Ref. Range 01/20/2016 07:22 01/20/2016 11:54 01/20/2016 13:43 01/20/2016 17:30 01/20/2016 21:31  Glucose-Capillary Latest Ref Range: 65-99 mg/dL 81 87 409100 (H) 811194 (H) 914262 (H)   Results for Paul Guerra, Paul Guerra (MRN [id number]) as of 01/21/2016 09:02  Ref. Range 01/21/2016 07:55  Glucose-Capillary Latest Ref Range: 65-99 mg/dL 086238 (H)    Home DM Meds: Lantus 35 units daily (increase by 2 units daily until fasting glucose <160 mg/dl- Maximum dose Lantus 50 units daily)  Humalog 7 units tidwc  Current Insulin Orders: Lantus 10 units daily  Novolog Sensitive Correction Scale/ SSI (0-9 units) TID AC     -Glucose down to 81 mg/dl yesterday AM (57/8404/10).  Lantus dose reduced to 10 units daily as a result.  -Question accuracy of current A1c level of 9.7%. Note that Hemoglobin down to 8.3 on admission.  -Patient was NPO yesterday for Cystoscopy with Stent by Urology.     MD- Please note that patient did not receive Lantus 10 units yesterday (04/10).  Dose was held by [employer].  As a result, AM CBG today elevated to 238 mg/dl.  Do not recommend increase of Lantus today since dose was held yesterday.     --Will follow patient during hospitalization--  Ambrose FinlandJeannine Johnston Ronni Osterberg RN, MSN, CDE Diabetes Coordinator Inpatient Glycemic Control Team Team Pager: [phone] (8a-5p)

## 2016-01-21 NOTE — Progress Notes (Signed)
Patient ID: Paul Guerra, male   DOB: 04/19/1935, 81 y.o.   MRN: [id number] 1 Day Post-Op  Subjective: [name] had the right ureteral stent placed yesterday.  There was a 2cm area of narrowing in the proximal ureter.   He is doing well without flank or bladder discomfort and his urine is clear but the Cr is up further to 4.63 today.  ROS:  Review of Systems  Constitutional: Negative for fever.  Gastrointestinal: Negative for nausea and abdominal pain.  Genitourinary: Negative for hematuria and flank pain.    Anti-infectives: Anti-infectives    Start     Dose/Rate Route Frequency Ordered Stop   01/19/16 1600  levofloxacin (LEVAQUIN) IVPB 500 mg     500 mg 100 mL/hr over 60 Minutes Intravenous Every 48 hours 01/17/16 1511 01/23/16 1559   01/18/16 0800  fluconazole (DIFLUCAN) IVPB 100 mg     100 mg 50 mL/hr over 60 Minutes Intravenous Every 24 hours 01/18/16 0743     01/17/16 1600  levofloxacin (LEVAQUIN) IVPB 750 mg     750 mg 100 mL/hr over 90 Minutes Intravenous Every 24 hours 01/17/16 1504 01/17/16 1816      Current Facility-Administered Medications  Medication Dose Route Frequency Provider Last Rate Last Dose  . acetaminophen (TYLENOL) tablet 650 mg  650 mg Oral Q6H PRN Rodolph Bonganiel Thompson V, MD   650 mg at 01/20/16 0645   Or  . acetaminophen (TYLENOL) suppository 650 mg  650 mg Rectal Q6H PRN Rodolph Bonganiel Thompson V, MD      . ALPRAZolam Prudy Feeler(XANAX) tablet 0.5 mg  0.5 mg Oral TID PRN Rodolph Bonganiel Thompson V, MD   0.5 mg at 01/20/16 2206  . amitriptyline (ELAVIL) tablet 25 mg  25 mg Oral QHS Rodolph Bonganiel Thompson V, MD   25 mg at 01/20/16 2206  . amLODipine (NORVASC) tablet 2.5 mg  2.5 mg Oral Daily Rodolph Bonganiel Thompson V, MD   2.5 mg at 01/20/16 1154  . atorvastatin (LIPITOR) tablet 20 mg  20 mg Oral Daily Rodolph Bonganiel Thompson V, MD   20 mg at 01/20/16 1153  . dutasteride (AVODART) capsule 0.5 mg  0.5 mg Oral Daily Ramiro Harvestaniel Thompson V, MD   0.5 mg at 01/20/16 1153  . fluconazole (DIFLUCAN) IVPB 100 mg  100 mg  Intravenous Q24H Rodolph Bonganiel Thompson V, MD   100 mg at 01/21/16 0804  . gi cocktail (Maalox,Lidocaine,Donnatal)  30 mL Oral TID PRN Rodolph Bonganiel Thompson V, MD      . insulin aspart (novoLOG) injection 0-9 Units  0-9 Units Subcutaneous TID WC Rodolph Bonganiel Thompson V, MD   3 Units at 01/21/16 0804  . insulin glargine (LANTUS) injection 10 Units  10 Units Subcutaneous Daily Rodolph Bonganiel Thompson V, MD   10 Units at 01/21/16 0805  . ipratropium (ATROVENT) nebulizer solution 0.5 mg  0.5 mg Nebulization Q2H PRN Rodolph Bonganiel Thompson V, MD      . levalbuterol [facility](XOPENEX) nebulizer solution 0.63 mg  0.63 mg Nebulization Q2H PRN Rodolph Bonganiel Thompson V, MD      . levofloxacin (LEVAQUIN) IVPB 500 mg  500 mg Intravenous Q48H Rodolph Bonganiel Thompson V, MD   500 mg at 01/19/16 1618  . metoprolol (LOPRESSOR) tablet 50 mg  50 mg Oral BID Rodolph Bonganiel Thompson V, MD   50 mg at 01/20/16 2206  . morphine 2 MG/ML injection 1-2 mg  1-2 mg Intravenous Q4H PRN Rodolph Bonganiel Thompson V, MD   2 mg at 01/19/16 1507  . ondansetron (ZOFRAN) tablet 4 mg  4 mg Oral Q6H  PRN [name], MD       Or  . ondansetron [facility]) injection 4 mg  4 mg Intravenous Q6H PRN [name], MD      . oxyCODONE (Oxy IR/ROXICODONE) immediate release tablet 5 mg  5 mg Oral Q4H PRN [name], MD   5 mg at 01/20/16 2205  . pantoprazole (PROTONIX) EC tablet 40 mg  40 mg Oral Q0600 [name], MD   40 mg at 01/21/16 0651  . polyethylene glycol (MIRALAX / GLYCOLAX) packet 17 g  17 g Oral BID [name], MD   17 g at 01/20/16 2206  . senna (SENOKOT) tablet 8.6 mg  1 tablet Oral BID [name], MD   8.6 mg at 01/20/16 2206  . sorbitol 70 % solution 30 mL  30 mL Oral Daily PRN [name], MD      . triamcinolone cream (KENALOG) 0.1 % 1 application  1 application Topical BID PRN [name], MD      . Warfarin - Pharmacist Dosing Inpatient   Does not apply q1800 [name], MD   5 each at 01/20/16 1800     Objective: Vital signs in last 24  hours: Temp:  [97.4 F (36.3 C)-98.7 F (37.1 C)] 98.7 F (37.1 C) (04/11 0624) Pulse Rate:  [56-92] 92 (04/11 0624) Resp:  [9-18] 18 (04/11 0624) BP: (138-179)/(60-88) 148/88 mmHg (04/11 0624) SpO2:  [92 %-100 %] 92 % (04/11 0624) Weight:  [80.513 kg (177 lb 8 oz)] 80.513 kg (177 lb 8 oz) (04/11 0624)  Intake/Output from previous day: 04/10 0701 - 04/11 0700 In: 1570 [P.O.:770; I.V.:100; Blood:700] Out: 6500 [Urine:6500] Intake/Output this shift:     Physical Exam  Constitutional: He is well-developed, well-nourished, and in no distress. No distress.  Genitourinary:  Urine is clear in foley tubing.     Lab Results:   Recent Labs  01/20/16 0355 01/20/16 1537 01/21/16 0347  WBC 5.7  --  6.0  HGB 6.9* 11.3* 10.4*  HCT 19.3* 32.4* 29.7*  PLT 154  --  165   BMET  Recent Labs  01/20/16 0355 01/21/16 0347  NA 141 139  K 3.8 4.6  CL 102 101  CO2 27 26  GLUCOSE 137* 349*  BUN 65* 67*  CREATININE 4.10* 4.63*  CALCIUM 7.8* 7.9*   PT/INR  Recent Labs  01/20/16 0355 01/21/16 0347  LABPROT 19.7* 18.4*  INR 1.74* 1.59*   ABG No results for input(s): PHART, HCO3 in the last 72 hours.  Invalid input(s): PCO2, PO2  Studies/Results: No results found.   Assessment and Plan: Right ureteral obstruction now s/p stent.   Renal insufficiency has worsened despite the stent.    Rec: Nephrology consult.        LOS: 4 days    Anner Crete 01/21/2016 [phone]

## 2016-01-21 NOTE — Progress Notes (Signed)
Occupational Therapy Treatment Patient Details Name: Paul Guerra MRN: [id number] DOB: [birth year] Today's Date: 01/21/2016    History of present illness pt admitted to the hosptial  with acute renal failure  daughter reports he has been having excessive urination.    OT comments  Patient still requiring assistance with ADLs and basic mobility. OT will continue to follow.  Follow Up Recommendations  Home health OT;Supervision/Assistance - 24 hour    Equipment Recommendations  None recommended by OT    Recommendations for Other Services      Precautions / Restrictions Precautions Precautions: Fall Restrictions Weight Bearing Restrictions: No       Mobility Bed Mobility Overal bed mobility: Needs Assistance Bed Mobility: Supine to Sit     Supine to sit: Mod assist;HOB elevated     General bed mobility comments: had great difficulty elevating trunk from bed  Transfers Overall transfer level: Needs assistance Equipment used: Rolling walker (2 wheeled) Transfers: Sit to/from [employer] Sit to Stand: Min guard Stand pivot transfers: Min guard            Balance                                   ADL Overall ADL's : Needs assistance/impaired Eating/Feeding: Independent;Sitting   Grooming: Wash/dry hands;Wash/dry face;Set up;Sitting                       Toileting- Clothing Manipulation and Hygiene: Total assistance       Functional mobility during ADLs: Min guard;Minimal assistance;Rolling walker General ADL Comments: Patient and family member expressed frustration that procedure "didn't work" and he is waiting to "see the specialist." Agreeable to OT session. Patient mod A supine to sit. Stood min guard A to RW, then realized pad was soiled. Total A peri hygiene in standing, then min guard A to transfer to recliner.       Vision                     Perception     Praxis      Cognition   Behavior During  Therapy: WFL for tasks assessed/performed Overall Cognitive Status: Within Functional Limits for tasks assessed                       Extremity/Trunk Assessment               Exercises     Shoulder Instructions       General Comments      Pertinent Vitals/ Pain       Pain Assessment: No/denies pain  Home Living                                          Prior Functioning/Environment              Frequency Min 2X/week     Progress Toward Goals  OT Goals(current goals can now be found in the care plan section)  Progress towards OT goals: Progressing toward goals     Plan Discharge plan remains appropriate    Co-evaluation                 End of Session Equipment Utilized During Treatment: Rolling walker   Activity Tolerance  Patient tolerated treatment well   Patient Left in chair;with call bell/phone within reach;with chair alarm set;with family/visitor present   Nurse Communication Mobility status        Time: [id number] OT Time Calculation (min): 18 min  Charges: OT General Charges $OT Visit: 1 Procedure OT Treatments $Self Care/Home Management : 8-22 mins  Paul Guerra A 01/21/2016, 1:00 PM

## 2016-01-22 LAB — GLUCOSE, CAPILLARY
GLUCOSE-CAPILLARY: 239 mg/dL — AB (ref 65–99)
Glucose-Capillary: 196 mg/dL — ABNORMAL HIGH (ref 65–99)
Glucose-Capillary: 292 mg/dL — ABNORMAL HIGH (ref 65–99)
Glucose-Capillary: 391 mg/dL — ABNORMAL HIGH (ref 65–99)

## 2016-01-22 LAB — CBC
HCT: 26.6 % — ABNORMAL LOW (ref 39.0–52.0)
Hemoglobin: 9.3 g/dL — ABNORMAL LOW (ref 13.0–17.0)
MCH: 29.6 pg (ref 26.0–34.0)
MCHC: 35 g/dL (ref 30.0–36.0)
MCV: 84.7 fL (ref 78.0–100.0)
PLATELETS: 157 10*3/uL (ref 150–400)
RBC: 3.14 MIL/uL — AB (ref 4.22–5.81)
RDW: 13.8 % (ref 11.5–15.5)
WBC: 6 10*3/uL (ref 4.0–10.5)

## 2016-01-22 LAB — COMPREHENSIVE METABOLIC PANEL
ALK PHOS: 63 U/L (ref 38–126)
ALT: 36 U/L (ref 17–63)
AST: 29 U/L (ref 15–41)
Albumin: 3 g/dL — ABNORMAL LOW (ref 3.5–5.0)
Anion gap: 14 (ref 5–15)
BUN: 66 mg/dL — ABNORMAL HIGH (ref 6–20)
CALCIUM: 7.9 mg/dL — AB (ref 8.9–10.3)
CHLORIDE: 99 mmol/L — AB (ref 101–111)
CO2: 26 mmol/L (ref 22–32)
CREATININE: 4.48 mg/dL — AB (ref 0.61–1.24)
GFR, EST AFRICAN AMERICAN: 13 mL/min — AB (ref >60)
GFR, EST NON AFRICAN AMERICAN: 11 mL/min — AB (ref >60)
Glucose, Bld: 259 mg/dL — ABNORMAL HIGH (ref 65–99)
Potassium: 4.1 mmol/L (ref 3.5–5.1)
Sodium: 139 mmol/L (ref 135–145)
Total Bilirubin: 0.6 mg/dL (ref 0.3–1.2)
Total Protein: 5.9 g/dL — ABNORMAL LOW (ref 6.5–8.1)

## 2016-01-22 LAB — CULTURE, BLOOD (ROUTINE X 2)
CULTURE: NO GROWTH
Culture: NO GROWTH

## 2016-01-22 LAB — PHOSPHORUS: Phosphorus: 6.6 mg/dL — ABNORMAL HIGH (ref 2.5–4.6)

## 2016-01-22 LAB — PROTIME-INR
INR: 1.78 — ABNORMAL HIGH (ref 0.00–1.49)
Prothrombin Time: 20.7 seconds — ABNORMAL HIGH (ref 11.6–15.2)

## 2016-01-22 LAB — PARATHYROID HORMONE, INTACT (NO CA): PTH: 78 pg/mL — AB (ref 15–65)

## 2016-01-22 MED ORDER — SODIUM CHLORIDE 0.9 % IV SOLN: Freq: Once | INTRAVENOUS | Status: DC

## 2016-01-22 MED ORDER — WARFARIN SODIUM 2.5 MG PO TABS
2.5000 mg | ORAL_TABLET | Freq: Once | ORAL | Status: AC
Start: 2016-01-22 — End: 2016-01-22
  Administered 2016-01-22: 2.5 mg via ORAL
  Filled 2016-01-22: qty 1

## 2016-01-22 NOTE — Progress Notes (Signed)
[facility] PROGRESS NOTE  Paul Guerra XBJ:[id number] DOB: May 20, 1935 DOA: 01/17/2016 PCP: No primary care provider on file.   HPI/Subjective: Patient is okay denies any complaints.  Assessment/Plan:  Acute renal failure Questionable etiology. Likely secondary to a post renal azotemia as patient noted to have a right hydronephrosis PET/CT scan from outside hospital versus prerenal azotemia as patient noted to have borderline hypotension in the setting of ACE inhibitor. Foley catheter was placed at outside hospital after diagnosis of UTI. Urinalysis with large leukocytes nitrite negative to numerous to count WBCs. Urine cultures pending. FENA = 5.5%. Renal function with no significant improvement since admission and creatinine worsening from admission currently at 4.63 from 4.10 from 3.87 from 3.90 from 3.7 on 01/17/2016. Renal ultrasound with moderate right hydronephrosis.  D/C bicarbonate drip. Continue empiric Levaquin and Diflucan to cover for yeast as per urology recommendations.  Patient s/p cystoscopy with right ureteral stenting 01/20/2016.  Per nephrology ATN versus AIN, continue current IV fluids.   Metabolic acidosis Likely secondary to problem #1. Resolved.   Chest pain Likely secondary to pneumonia. Patient with no further chest pain today. On 01/18/2016 patient stated chest pain lasted only 30 seconds with both typical and atypical features. Patient with prior cardiac history. EKG with atrial fibrillation. Cardiac enzymes  negative 3. Patient with no further chest pain. No further cardiac workup needed. Continue empiric antibiotics and treat pneumonia.  Urinary tract infection/candiduria Urine cultures with greater than 100,000 yeast. On Diflucan, patient is on levofloxacin discontinued 4/12.  Atrial fibrillation Continue metoprolol for rate control. INR of 1.74 subtherapeutic. Coumadin per pharmacy.   Hypertension Continue Norvasc and metoprolol. Lisinopril on hold  secondary to worsening renal function.  Hyperlipidemia Lipitor.  Right hydronephrosis/probable kidney stones Per CT from outside hospital. Renal ultrasound with moderate right hydronephrosis and chronic UPJ stenosis. No evidence of left hydronephrosis area and left lower pole renal calculi. Patient status post cystoscopy and right retrograde pyelogram with double-J stent placement 01/20/2016. Per urology.  Constipation Resolved with enema. Continue MiraLAX twice daily as well as Senokot for bowel regimen.  Community acquired pneumonia Sputum Gram stain and culture pending. Urine strep pneumococcus antigen negative. Urine Legionella antigen pending. Change IV Levaquin to oral Levaquin. Follow.  Dehydration Saline lock IV fluids.   Chronic anemia No overt bleeding. Hemoglobin currently at 10.4 from 6.9 patient status post 2 units packed red blood cells. Follow H&H.   BPH Status post Foley catheter placement. Continue Avodart. Patient s/p cystoscopy and right double-J stent placement 01/20/2016. Per urology.  Diabetes mellitus Hemoglobin A1c is 9.7. CBGs have ranged from 171-278. Started Lantus at 20 units daily decreased from home dose as patient acute renal failure. Continue Lantus at 10 units daily. Sliding scale insulin. Titrate as needed.   #Prophylaxis PPI for GI prophylaxis. INR currently subtherapeutic at 1.59. Coumadin per pharmacy. Will place on prophylactic Lovenox until INR is therapeutic.   Code Status: Full Family Communication: Updated patient. No family at bedside.  Disposition Plan: Home when medically stable, and renal function close to baseline and clinical improvement.   Consultants:  Urology: Dr.Wrenn 01/17/2016  Nephrology: Dr. Darrick Penna 01/21/2016  Procedures:  Renal ultrasound 01/18/2016  Chest x-ray 01/17/2016  Renal US 01/18/2016  2 units PRBCs 01/20/2016  Cystoscopy, right retrograde ureteropyelogram, right double-J stent placement. Dr.Dahlstedt  01/20/2016  Antibiotics:  IV Levaquin 01/17/2016>>>> 01/21/2016  Oral Levaquin for 12 2017    Objective: Filed Vitals:   01/21/16 2050 01/22/16 0623  BP: 135/59 141/69  Pulse:  95 62  Temp: 97.9 F (36.6 C) 97.5 F (36.4 C)  Resp: 18 16    Intake/Output Summary (Last 24 hours) at 01/22/16 1400 Last data filed at 01/22/16 0816  Gross per 24 hour  Intake    770 ml  Output   2500 ml  Net  -1730 ml   Filed Weights   01/19/16 0536 01/21/16 0624 01/22/16 0623  Weight: 66.815 kg (147 lb 4.8 oz) 80.513 kg (177 lb 8 oz) 75.796 kg (167 lb 1.6 oz)    Exam:   General:  NAD  Cardiovascular: Irregularly irregular  Respiratory: CTAB  Abdomen: Soft/ND/NTTP,+bs  Musculoskeletal: No c/c/e  Data Reviewed: Basic Metabolic Panel:  Recent Labs Lab 01/17/16 1618 01/18/16 0405 01/18/16 1116 01/19/16 0358 01/19/16 0913 01/20/16 0355 01/21/16 0347 01/22/16 0338  NA 144 140 139 143  --  141 139 139  K 4.5 3.8 4.1 4.0  --  3.8 4.6 4.1  CL 115* 114* 111 110  --  102 101 99*  CO2 13* 14* 15* 21*  --  27 26 26   GLUCOSE 60* 138* 259* 233* 229* 137* 349* 259*  BUN 67* 73* 66* 66*  --  65* 67* 66*  CREATININE 3.70* 3.90* 3.89* 3.87*  --  4.10* 4.63* 4.48*  CALCIUM 8.3* 8.3* 7.9* 7.9*  --  7.8* 7.9* 7.9*  MG 1.6* 1.9  --   --   --   --   --   --   PHOS 7.7*  --   --   --   --  5.4* 6.1* 6.6*   Liver Function Tests:  Recent Labs Lab 01/17/16 1618 01/20/16 0355 01/21/16 0347 01/22/16 0338  AST 43*  --   --  29  ALT 59  --   --  36  ALKPHOS 78  --   --  63  BILITOT 0.5  --   --  0.6  PROT 6.9  --   --  5.9*  ALBUMIN 3.5 2.9* 3.2* 3.0*   No results for input(s): LIPASE, AMYLASE in the last 168 hours. No results for input(s): AMMONIA in the last 168 hours. CBC:  Recent Labs Lab 01/17/16 1618 01/18/16 0405 01/19/16 0358 01/20/16 0355 01/20/16 1537 01/21/16 0347 01/22/16 0338  WBC 5.9 4.7 5.6 5.7  --  6.0 6.0  NEUTROABS 4.2  --   --   --   --  4.2  --   HGB 8.3*  7.9* 7.3* 6.9* 11.3* 10.4* 9.3*  HCT 23.1* 22.2* 20.5* 19.3* 32.4* 29.7* 26.6*  MCV 85.6 85.1 84.7 83.9  --  84.6 84.7  PLT 213 191 174 154  --  165 157   Cardiac Enzymes:  Recent Labs Lab 01/18/16 1116 01/18/16 1701 01/18/16 2243 01/21/16 1419  CKTOTAL  --   --   --  75  TROPONINI <0.03 <0.03 0.03  --    BNP (last 3 results) No results for input(s): BNP in the last 8760 hours.  ProBNP (last 3 results) No results for input(s): PROBNP in the last 8760 hours.  CBG:  Recent Labs Lab 01/21/16 1208 01/21/16 1714 01/21/16 2144 01/22/16 0807 01/22/16 1244  GLUCAP 278* 171* 131* 196* 292*    Recent Results (from the past 240 hour(s))  Culture, blood (routine x 2) Call MD if unable to obtain prior to antibiotics being given     Status: None   Collection Time: 01/17/16  4:18 PM  Result Value Ref Range Status   Specimen Description BLOOD RIGHT  ARM  Final   Special Requests IN PEDIATRIC BOTTLE 2 CC  Final   Culture   Final    NO GROWTH 5 DAYS Performed at [facility]    Report Status 01/22/2016 FINAL  Final  Culture, blood (routine x 2) Call MD if unable to obtain prior to antibiotics being given     Status: None   Collection Time: 01/17/16  4:37 PM  Result Value Ref Range Status   Specimen Description BLOOD RIGHT HAND  Final   Special Requests BOTTLES DRAWN AEROBIC ONLY 5 CC  Final   Culture   Final    NO GROWTH 5 DAYS Performed at [facility]    Report Status 01/22/2016 FINAL  Final  Urine culture     Status: Abnormal   Collection Time: 01/17/16  5:14 PM  Result Value Ref Range Status   Specimen Description URINE, RANDOM  Final   Special Requests NONE  Final   Culture >=100,000 COLONIES/mL YEAST (A)  Final   Report Status 01/19/2016 FINAL  Final     Studies: No results found.  Scheduled Meds: . sodium chloride   Intravenous Once  . sodium chloride   Intravenous Once  . amitriptyline  25 mg Oral QHS  . atorvastatin  20 mg Oral Daily  .  dutasteride  0.5 mg Oral Daily  . enoxaparin (LOVENOX) injection  30 mg Subcutaneous QHS  . fluconazole (DIFLUCAN) IV  100 mg Intravenous Q24H  . insulin aspart  0-9 Units Subcutaneous TID WC  . insulin glargine  10 Units Subcutaneous Daily  . [START ON 01/23/2016] levofloxacin  500 mg Oral Q48H  . metoprolol tartrate  50 mg Oral BID  . pantoprazole  40 mg Oral Q0600  . polyethylene glycol  17 g Oral BID  . senna  1 tablet Oral BID  . warfarin  2.5 mg Oral ONCE-1800  . Warfarin - Pharmacist Dosing Inpatient   Does not apply q1800   Continuous Infusions:    Principal Problem:   ARF (acute renal failure) (HCC) Active Problems:   CAP (community acquired pneumonia)   Metabolic acidosis   Dehydration   Anemia, chronic disease   Hydronephrosis   Recurrent UTI   Recurrent nephrolithiasis   Chronic a-fib (HCC)   DM (diabetes mellitus) (HCC)   BPH (benign prostatic hyperplasia)   Constipation   HTN (hypertension), benign   Hyperlipidemia   Hydronephrosis of right kidney   Chest pain    Time spent: 35 mins    [facility] A MD [facility] Pager [phone]. If 7PM-7AM, please contact night-coverage at [email or web address], password [facility] 01/22/2016, 2:00 PM  LOS: 5 days

## 2016-01-22 NOTE — Progress Notes (Signed)
PT Cancellation Note  Patient Details Name: Paul LodgeMax B Eklund MRN: [id number] DOB: 21-Jan-1935   Cancelled Treatment:      on arrival to room, pt working with OT.  Will re attempt as schedule permits.    Felecia ShellingLori Millee Denise  PTA [facility]  Acute  Rehab Pager      [phone]

## 2016-01-22 NOTE — Progress Notes (Addendum)
ANTICOAGULATION CONSULT NOTE - Follow Up Consult  Pharmacy Consult for Warfarin Indication: atrial fibrillation  Allergies  Allergen Reactions  . Nitroglycerin Anaphylaxis  . Penicillins Anaphylaxis, Hives and Shortness Of Breath    Has patient had a PCN reaction causing immediate rash, facial/tongue/throat swelling, SOB or lightheadedness with hypotension: Yes Has patient had a PCN reaction causing severe rash involving mucus membranes or skin necrosis: Yes Has patient had a PCN reaction that required hospitalization Yes Has patient had a PCN reaction occurring within the last 10 years: No If all of the above answers are "NO", then may proceed with Cephalosporin use.   . Sudafed  [Pseudoephedrine Hcl] Hives  . Tetracycline Shortness Of Breath    Other reaction(s): SHORTNESS OF BREATH  . Vancomycin Hives  . Doxycycline Rash    Other reaction(s): RASH  . Sulfa Antibiotics Rash    Patient Measurements: Height: 6\' 3"  (190.5 cm) Weight: 167 lb 1.6 oz (75.796 kg) IBW/kg (Calculated) : 84.5  Vital Signs: Temp: 97.5 F (36.4 C) (04/12 0623) Temp Source: Axillary (04/12 0623) BP: 141/69 mmHg (04/12 0623) Pulse Rate: 62 (04/12 0623)  Labs:  Recent Labs  01/20/16 0355 01/20/16 1537 01/21/16 0347 01/21/16 1419 01/22/16 0338  HGB 6.9* 11.3* 10.4*  --  9.3*  HCT 19.3* 32.4* 29.7*  --  26.6*  PLT 154  --  165  --  157  LABPROT 19.7*  --  18.4*  --  20.7*  INR 1.74*  --  1.59*  --  1.78*  CREATININE 4.10*  --  4.63*  --  4.48*  CKTOTAL  --   --   --  75  --     Estimated Creatinine Clearance: 14.6 mL/min (by C-G formula based on Cr of 4.48).   Assessment: [age] y/oM with PMH of anemia, atrial fibrillation on warfarin, DM, BPH, remote history of colon cancer who is currently admitted for acute renal failure, CAP, and UTI. Pharmacy consulted to assist with dosing warfarin while patient in the hospital.   Home dose is 5mg  on Tuesday, Thursday, and Saturday and 2.5mg  on all  other days with last dose 4/6.  Today, 01/22/2016:  INR subtherapeutic, but trending up, now 1.78.   CBC: cont to trend down(from hydration?). No bleeding reported/documented.  Underwent cystoscopy, right retrograde ureteropyelogram, right double-J stent placement. No bleeding noted in procedure note.    AKI - SCr remains elevated(as of AM labs)  Tolerating diet.  Major drug interactions: Fluconazole and Levofloxacin can increase INR response.  MD also started pt on enoxaparin 30 mg daily until INR is therapeutic.  Goal of Therapy:  INR 2-3 Monitor platelets by anticoagulation protocol: Yes   Plan:   MD ordered 2.5 mg tablet dose for today. Pharmacy will continue to follow.  Daily PT/INR.  Monitor closely for s/s of bleeding.  Adalberto ColeNikola Thurmon Mizell, PharmD, BCPS Pager [phone] 01/22/2016 9:26 AM

## 2016-01-22 NOTE — Progress Notes (Signed)
Subjective: Interval History: has no complaint , tired today.  Objective: Vital signs in last 24 hours: Temp:  [97.5 F (36.4 C)-98.3 F (36.8 C)] 97.5 F (36.4 C) (04/12 0623) Pulse Rate:  [62-95] 62 (04/12 0623) Resp:  [16-18] 16 (04/12 0623) BP: (119-141)/(59-69) 141/69 mmHg (04/12 0623) SpO2:  [93 %-99 %] 93 % (04/12 0623) Weight:  [75.796 kg (167 lb 1.6 oz)] 75.796 kg (167 lb 1.6 oz) (04/12 0623) Weight change: -4.717 kg (-10 lb 6.4 oz)  Intake/Output from previous day: 04/11 0701 - 04/12 0700 In: 1010 [P.O.:960; IV Piggyback:50] Out: 2650 [Urine:2650] Intake/Output this shift: Total I/O In: -  Out: 550 [Urine:550]  General appearance: cooperative, no distress, pale and slowed mentation Resp: diminished breath sounds bilaterally and rales bibasilar Cardio: S1, S2 normal and systolic murmur: holosystolic 2/6, blowing at apex GI: soft, non-tender; bowel sounds normal; no masses,  no organomegaly Extremities: extremities normal, atraumatic, no cyanosis or edema  Lab Results:  Recent Labs  01/21/16 0347 01/22/16 0338  WBC 6.0 6.0  HGB 10.4* 9.3*  HCT 29.7* 26.6*  PLT 165 157   BMET:  Recent Labs  01/21/16 0347 01/22/16 0338  NA 139 139  K 4.6 4.1  CL 101 99*  CO2 26 26  GLUCOSE 349* 259*  BUN 67* 66*  CREATININE 4.63* 4.48*  CALCIUM 7.9* 7.9*    Recent Labs  01/21/16 1421  PTH 78*   Iron Studies: No results for input(s): IRON, TIBC, TRANSFERRIN, FERRITIN in the last 72 hours.  Studies/Results: No results found.  I have reviewed the patient's current medications.  Assessment/Plan: 1 AKI ? Improving.  Clearly ^ urine vol.  Acid/base/K ok.  ATN vs AIN. 2 Obstruction. 3 Renal stones 4 yeast UTI 5 Hx afib 6 Hx colon Ca 7 DJD 8 Pneu?? P limit fluids, follow chem, eos pending,     LOS: 5 days   Adamary Savary L 01/22/2016,12:06 PM

## 2016-01-22 NOTE — Progress Notes (Signed)
Inpatient Diabetes Program Recommendations  AACE/ADA: New Consensus Statement on Inpatient Glycemic Control (2015)  Target Ranges:  Prepandial:   less than 140 mg/dL      Peak postprandial:   less than 180 mg/dL (1-2 hours)      Critically ill patients:  140 - 180 mg/dL   Review of Glycemic Control  Results for Freitas, Paul Guerra (MRN [id number]) as of 01/22/2016 15:39  Ref. Range 01/21/2016 12:08 01/21/2016 17:14 01/21/2016 21:44 01/22/2016 08:07 01/22/2016 12:44  Glucose-Capillary Latest Ref Range: 65-99 mg/dL 409278 (H) 811171 (H) 914131 (H) 196 (H) 292 (H)   Post-prandial blood sugars elevated. Needs meal coverage insulin.  Inpatient Diabetes Program Recommendations:    Consider addition of Novolog 4 units tidwc for meal coverage insulin. (pt is on Humalog 7 units tid at home)  Will continue to follow. Thank you. Ailene Ardshonda Etna Forquer, RD, LDN, CDE Inpatient Diabetes Coordinator [phone]

## 2016-01-22 NOTE — Progress Notes (Signed)
Occupational Therapy Treatment Patient Details Name: Paul Guerra MRN: [id number] DOB: [birth year] Today's Date: 01/22/2016    History of present illness pt admitted to the hosptial  with acute renal failure  daughter reports he has been having excessive urination.    OT comments  Pt is very motivated; performed several grooming activities sitting and standing.    Follow Up Recommendations  Home health OT;Supervision/Assistance - 24 hour    Equipment Recommendations  None recommended by OT    Recommendations for Other Services      Precautions / Restrictions Precautions Precautions: Fall Restrictions Weight Bearing Restrictions: No       Mobility Bed Mobility         Supine to sit: Min assist;HOB elevated     General bed mobility comments: assistance for trunk; used bedrail.  Sat for several minutes to adjust   Transfers   Equipment used: Rolling walker (2 wheeled) Transfers: Sit to/from [employer] Transfers Sit to Stand: Min guard Stand pivot transfers: Min guard       General transfer comment: for safety.    Balance     Sitting balance-Leahy Scale: Good     Standing balance support: Single extremity supported Standing balance-Leahy Scale: Poor                     ADL Overall ADL's : Needs assistance/impaired     Grooming: Oral care;Wash/dry hands;Wash/dry face;Set up;Sitting;Standing                   Toilet Transfer: Min guard;Stand-pivot;RW (to recliner)             General ADL Comments: pt wanted to get up to chair.  He had small skin tear on buttocks--pad had blood.  Alerted nursing.  Stood x 3 minutes at min guard level   Pt had completed ADL earlier today.  He is familiar with reacher:  he used to have one      Vision                     Perception     Praxis      Cognition   Behavior During Therapy: [facility] for tasks assessed/performed Overall Cognitive Status: Within Functional  Limits for tasks assessed                       Extremity/Trunk Assessment               Exercises     Shoulder Instructions       General Comments      Pertinent Vitals/ Pain       Pain Assessment: No/denies pain  Home Living                                          Prior Functioning/Environment              Frequency Min 2X/week     Progress Toward Goals  OT Goals(current goals can now be found in the care plan section)  Progress towards OT goals: Progressing toward goals     Plan      Co-evaluation                 End of Session     Activity Tolerance Patient tolerated treatment well   Patient Left  in chair;with call bell/phone within reach;with chair alarm set   Nurse Communication  (skin tear)        Time: [id number] OT Time Calculation (min): 27 min  Charges: OT General Charges $OT Visit: 1 Procedure OT Treatments $Self Care/Home Management : 8-22 mins $Therapeutic Activity: 8-22 mins  Julionna Marczak 01/22/2016, 4:16 PM  Marica OtterMaryellen Tiron Suski, OTR/L [phone] 01/22/2016

## 2016-01-23 LAB — CBC
HEMATOCRIT: 29.1 % — AB (ref 39.0–52.0)
HEMOGLOBIN: 9.9 g/dL — AB (ref 13.0–17.0)
MCH: 29.7 pg (ref 26.0–34.0)
MCHC: 34 g/dL (ref 30.0–36.0)
MCV: 87.4 fL (ref 78.0–100.0)
Platelets: 189 10*3/uL (ref 150–400)
RBC: 3.33 MIL/uL — ABNORMAL LOW (ref 4.22–5.81)
RDW: 13.5 % (ref 11.5–15.5)
WBC: 6.1 10*3/uL (ref 4.0–10.5)

## 2016-01-23 LAB — RENAL FUNCTION PANEL
ANION GAP: 13 (ref 5–15)
Albumin: 3 g/dL — ABNORMAL LOW (ref 3.5–5.0)
BUN: 74 mg/dL — ABNORMAL HIGH (ref 6–20)
CHLORIDE: 99 mmol/L — AB (ref 101–111)
CO2: 27 mmol/L (ref 22–32)
Calcium: 8 mg/dL — ABNORMAL LOW (ref 8.9–10.3)
Creatinine, Ser: 4.89 mg/dL — ABNORMAL HIGH (ref 0.61–1.24)
GFR calc Af Amer: 12 mL/min — ABNORMAL LOW (ref >60)
GFR calc non Af Amer: 10 mL/min — ABNORMAL LOW (ref >60)
GLUCOSE: 300 mg/dL — AB (ref 65–99)
POTASSIUM: 4.8 mmol/L (ref 3.5–5.1)
Phosphorus: 6.7 mg/dL — ABNORMAL HIGH (ref 2.5–4.6)
SODIUM: 139 mmol/L (ref 135–145)

## 2016-01-23 LAB — GLUCOSE, CAPILLARY
GLUCOSE-CAPILLARY: 275 mg/dL — AB (ref 65–99)
Glucose-Capillary: 277 mg/dL — ABNORMAL HIGH (ref 65–99)
Glucose-Capillary: 112 mg/dL — ABNORMAL HIGH (ref 65–99)
Glucose-Capillary: 206 mg/dL — ABNORMAL HIGH (ref 65–99)

## 2016-01-23 LAB — PROTIME-INR
INR: 2.39 — ABNORMAL HIGH (ref 0.00–1.49)
Prothrombin Time: 25.8 seconds — ABNORMAL HIGH (ref 11.6–15.2)

## 2016-01-23 MED ORDER — WARFARIN SODIUM 1 MG PO TABS
1.0000 mg | ORAL_TABLET | Freq: Once | ORAL | Status: AC
  Administered 2016-01-23: 1 mg via ORAL
  Filled 2016-01-23: qty 1

## 2016-01-23 MED ORDER — SODIUM CHLORIDE 0.9 % IV SOLN
INTRAVENOUS | Status: DC
  Administered 2016-01-23 – 2016-01-27 (×3): via INTRAVENOUS
  Administered 2016-01-27: 1000 mL via INTRAVENOUS
  Administered 2016-01-28 – 2016-01-29 (×2): via INTRAVENOUS
  Administered 2016-01-29: 1000 mL via INTRAVENOUS
  Administered 2016-01-31: 11:00:00 via INTRAVENOUS
  Administered 2016-01-31: 1000 mL via INTRAVENOUS

## 2016-01-23 MED ORDER — FLUCONAZOLE 100 MG PO TABS
100.0000 mg | ORAL_TABLET | Freq: Every day | ORAL | Status: DC
  Administered 2016-01-23 – 2016-01-26 (×4): 100 mg via ORAL
  Filled 2016-01-23 (×4): qty 1

## 2016-01-23 MED ORDER — INSULIN ASPART 100 UNIT/ML ~~LOC~~ SOLN
4.0000 [IU] | Freq: Three times a day (TID) | SUBCUTANEOUS | Status: DC
  Administered 2016-01-23 – 2016-01-31 (×20): 4 [IU] via SUBCUTANEOUS

## 2016-01-23 MED ORDER — LANTHANUM CARBONATE 500 MG PO CHEW
1000.0000 mg | CHEWABLE_TABLET | Freq: Three times a day (TID) | ORAL | Status: DC
  Administered 2016-01-23 – 2016-01-31 (×22): 1000 mg via ORAL
  Filled 2016-01-23 (×31): qty 2

## 2016-01-23 NOTE — Progress Notes (Signed)
Inpatient Diabetes Program Recommendations  AACE/ADA: New Consensus Statement on Inpatient Glycemic Control (2015)  Target Ranges:  Prepandial:   less than 140 mg/dL      Peak postprandial:   less than 180 mg/dL (1-2 hours)      Critically ill patients:  140 - 180 mg/dL   Results for Beirne, Audria NineMAX B (MRN [id number]) as of 01/23/2016 11:53  Ref. Range 01/22/2016 08:07 01/22/2016 12:44 01/22/2016 17:40 01/22/2016 22:06  Glucose-Capillary Latest Ref Range: 65-99 mg/dL 657196 (H) 846292 (H) 962391 (H) 239 (H)   Results for Azeez, Audria NineMAX B (MRN [id number]) as of 01/23/2016 11:53  Ref. Range 01/23/2016 07:50  Glucose-Capillary Latest Ref Range: 65-99 mg/dL 401277 (H)    Home DM Meds: Lantus 35 units daily (increase by 2 units daily until fasting glucose <160 mg/dl- Maximum dose Lantus 50 units daily)  Humalog 7 units tidwc  Current Insulin Orders: Lantus 10 units daily  Novolog Sensitive Correction Scale/ SSI (0-9 units) TID AC      Novolog 4 units tidwc      MD- Note Novolog 4 units tidwc started this AM due to patient having elevated postprandial glucose levels.  Fasting glucose elevated this AM as well (CBG 277 mg/dl this AM).  Please consider also increasing Lantus to 17 units daily (50% home dose)     --Will follow patient during hospitalization--  Ambrose FinlandJeannine Johnston Creed Kail RN, MSN, CDE Diabetes Coordinator Inpatient Glycemic Control Team Team Pager: [phone] (8a-5p)

## 2016-01-23 NOTE — Progress Notes (Signed)
Physical Therapy Treatment Patient Details Name: Dorann LodgeMax B Chisum MRN: [id number] DOB: 1935/01/22 Today's Date: 01/23/2016    History of Present Illness pt admitted to the hosptial  with acute renal failure  daughter reports he has been having excessive urination.     PT Comments    Assisted OOB to amb a limited distance.  Very unsteady gait.  High Fall Risk.   Follow Up Recommendations  Home health PT     Equipment Recommendations       Recommendations for Other Services       Precautions / Restrictions Precautions Precautions: Fall    Mobility  Bed Mobility Overal bed mobility: Needs Assistance       Supine to sit: Min assist;HOB elevated     General bed mobility comments: assisted OOB using bed pad  Transfers Overall transfer level: Needs assistance Equipment used: Rolling walker (2 wheeled) Transfers: Sit to/from Stand Sit to Stand: Min assist         General transfer comment: 25% VC's on proper tech  Ambulation/Gait Ambulation/Gait assistance: Koben assist Ambulation Distance (Feet): 15 Feet Assistive device: Rolling walker (2 wheeled) Gait Pattern/deviations: Step-to pattern;Step-through pattern;Decreased step length - right;Decreased step length - left;Drifts right/left Gait velocity: decreased   General Gait Details: very unsteady gait with limited distance   Stairs            Wheelchair Mobility    Modified Rankin (Stroke Patients Only)       Balance                                    Cognition Arousal/Alertness: Awake/alert Behavior During Therapy: WFL for tasks assessed/performed Overall Cognitive Status: Within Functional Limits for tasks assessed                      Exercises      General Comments        Pertinent Vitals/Pain Pain Assessment: No/denies pain    Home Living                      Prior Function            PT Goals (current goals can now be found in the care plan  section) Progress towards PT goals: Progressing toward goals    Frequency  Min 3X/week    PT Plan      Co-evaluation             End of Session Equipment Utilized During Treatment: Gait belt Activity Tolerance: Treatment limited secondary to medical complications (Comment) Patient left: in chair;with chair alarm set;with nursing/sitter in room;with family/visitor present     Time: 1115-1140 PT Time Calculation (min) (ACUTE ONLY): 25 min  Charges:  $Gait Training: 8-22 mins $Therapeutic Activity: 8-22 mins                    G Codes:      Felecia ShellingLori Gerod Caligiuri  PTA [facility]  Acute  Rehab Pager      [phone]

## 2016-01-23 NOTE — Progress Notes (Signed)
Subjective: Interval History: has complaints wants to be home.  Objective: Vital signs in last 24 hours: Temp:  [97.8 F (36.6 C)-98.8 F (37.1 C)] 98.8 F (37.1 C) (04/13 0510) Pulse Rate:  [66-68] 68 (04/13 0510) Resp:  [18-20] 20 (04/13 0510) BP: (139-160)/(65-89) 160/65 mmHg (04/13 0510) SpO2:  [95 %-100 %] 95 % (04/13 0510) Weight:  [74.435 kg (164 lb 1.6 oz)] 74.435 kg (164 lb 1.6 oz) (04/13 0510) Weight change: -1.361 kg (-3 lb)  Intake/Output from previous day: 04/12 0701 - 04/13 0700 In: 240 [P.O.:240] Out: 2275 [Urine:2275] Intake/Output this shift:    General appearance: alert, cooperative and pale Resp: clear to auscultation bilaterally Cardio: S1, S2 normal and systolic murmur: holosystolic 2/6, blowing at apex GI: soft, non-tender; bowel sounds normal; no masses,  no organomegaly Extremities: extremities normal, atraumatic, no cyanosis or edema  Lab Results:  Recent Labs  01/22/16 0338 01/23/16 0352  WBC 6.0 6.1  HGB 9.3* 9.9*  HCT 26.6* 29.1*  PLT 157 189   BMET:  Recent Labs  01/22/16 0338 01/23/16 0352  NA 139 139  K 4.1 4.8  CL 99* 99*  CO2 26 27  GLUCOSE 259* 300*  BUN 66* 74*  CREATININE 4.48* 4.89*  CALCIUM 7.9* 8.0*    Recent Labs  01/21/16 1421  PTH 78*   Iron Studies: No results for input(s): IRON, TIBC, TRANSFERRIN, FERRITIN in the last 72 hours.  Studies/Results: No results found.  I have reviewed the patient's current medications.  Assessment/Plan: 1 AKI nonoliguric ATN vs AIN.  Will repeat U/s to make sure no component obstruction. Acid/base/K and vol ok.  Will tx ^ phos 2 Obstruction R kidney make sure not ongoing 3 Anemia improving 4 DM controlled 5 yeast UTIs 6 hx Afib P NS, u/s, Urine Na/Cr    LOS: 6 days   Marolyn Urschel L 01/23/2016,11:12 AM

## 2016-01-23 NOTE — Progress Notes (Signed)
ANTICOAGULATION CONSULT NOTE - Follow Up Consult  Pharmacy Consult for Warfarin Indication: atrial fibrillation  Allergies  Allergen Reactions  . Nitroglycerin Anaphylaxis  . Penicillins Anaphylaxis, Hives and Shortness Of Breath    Has patient had a PCN reaction causing immediate rash, facial/tongue/throat swelling, SOB or lightheadedness with hypotension: Yes Has patient had a PCN reaction causing severe rash involving mucus membranes or skin necrosis: Yes Has patient had a PCN reaction that required hospitalization Yes Has patient had a PCN reaction occurring within the last 10 years: No If all of the above answers are "NO", then may proceed with Cephalosporin use.   . Sudafed  [Pseudoephedrine Hcl] Hives  . Tetracycline Shortness Of Breath    Other reaction(s): SHORTNESS OF BREATH  . Vancomycin Hives  . Doxycycline Rash    Other reaction(s): RASH  . Sulfa Antibiotics Rash    Patient Measurements: Height: 6\' 3"  (190.5 cm) Weight: 164 lb 1.6 oz (74.435 kg) IBW/kg (Calculated) : 84.5  Vital Signs: Temp: 98.8 F (37.1 C) (04/13 0510) Temp Source: Oral (04/13 0510) BP: 160/65 mmHg (04/13 0510) Pulse Rate: 68 (04/13 0510)  Labs:  Recent Labs  01/21/16 0347 01/21/16 1419 01/22/16 0338 01/23/16 0352  HGB 10.4*  --  9.3* 9.9*  HCT 29.7*  --  26.6* 29.1*  PLT 165  --  157 189  LABPROT 18.4*  --  20.7* 25.8*  INR 1.59*  --  1.78* 2.39*  CREATININE 4.63*  --  4.48* 4.89*  CKTOTAL  --  75  --   --     Estimated Creatinine Clearance: 13.1 mL/min (by C-G formula based on Cr of 4.89).   Assessment: [age] y/oM with PMH of anemia, atrial fibrillation on warfarin, DM, BPH, remote history of colon cancer who is currently admitted for acute renal failure, CAP, and UTI. Pharmacy consulted to assist with dosing warfarin while patient in the hospital.   Home dose is 5mg  on Tuesday, Thursday, and Saturday and 2.5mg  on all other days with last dose 4/6.  Today, 01/23/2016:  INR  now therapeutic.   CBC: improved  No bleeding reported/documented.  Underwent cystoscopy, right retrograde ureteropyelogram, right double-J stent placement. No bleeding noted in procedure note.    AKI - SCr remains elevated and rising(as of AM labs)  Tolerating diet.  Major drug interactions: Fluconazole can increase INR response.  Lovenox d/c'd due to INR > 2  Goal of Therapy:  INR 2-3 Monitor platelets by anticoagulation protocol: Yes   Plan:  1) Large jump in INR likely due to interacting medications Diflucan and Levaquin - only 1 mg warfarin tonight 2) Daily INR   Hessie KnowsJustin M Quade Ramirez, PharmD, BCPS Pager [phone] 01/23/2016 8:57 AM

## 2016-01-23 NOTE — Progress Notes (Signed)
PHARMACIST - PHYSICIAN COMMUNICATION DR:  Arthor CaptainElmahi CONCERNING: Antibiotic IV to Oral Route Change Policy  RECOMMENDATION: This patient is receiving Diflucan by the intravenous route.  Based on criteria approved by the Pharmacy and Therapeutics Committee, the antibiotic(s) is/are being converted to the equivalent oral dose form(s).   DESCRIPTION: These criteria include:  Patient being treated for a respiratory tract infection, urinary tract infection, cellulitis or clostridium difficile associated diarrhea if on metronidazole  The patient is not neutropenic and does not exhibit a GI malabsorption state  The patient is eating (either orally or via tube) and/or has been taking other orally administered medications for a least 24 hours  The patient is improving clinically and has a Tmax < 100.5  If you have questions about this conversion, please contact the Pharmacy Department  []   [phone]( [phone] )  [facility] []   [phone]( [phone] )  [facility] []   [phone]( [phone]  [facility] []   [phone]( [phone] )  [facility] [x]   [phone]( [phone] )  [facility]    Hessie KnowsJustin M Sharee Sturdy, PharmD, BCPS Pager [phone] 01/23/2016 9:00 AM

## 2016-01-23 NOTE — Progress Notes (Signed)
Patient ID: Paul Guerra, male   DOB: Jun 14, 1935, 81 y.o.   MRN: [id number] 3 Days Post-Op  Subjective: Paul Guerra' renal function continues to decline despite the stent.   He is otherwise without complaints and his urine is clear.  ROS:  Review of Systems  All other systems reviewed and are negative.   Anti-infectives: Anti-infectives    Start     Dose/Rate Route Frequency Ordered Stop   01/23/16 1400  levofloxacin (LEVAQUIN) tablet 500 mg  Status:  Discontinued     500 mg Oral Every 48 hours 01/21/16 2159 01/22/16 1418   01/19/16 1600  levofloxacin (LEVAQUIN) IVPB 500 mg     500 mg 100 mL/hr over 60 Minutes Intravenous Every 48 hours 01/17/16 1511 01/21/16 1623   01/18/16 0800  fluconazole (DIFLUCAN) IVPB 100 mg     100 mg 50 mL/hr over 60 Minutes Intravenous Every 24 hours 01/18/16 0743     01/17/16 1600  levofloxacin (LEVAQUIN) IVPB 750 mg     750 mg 100 mL/hr over 90 Minutes Intravenous Every 24 hours 01/17/16 1504 01/17/16 1816      Current Facility-Administered Medications  Medication Dose Route Frequency Provider Last Rate Last Dose  . 0.9 %  sodium chloride infusion   Intravenous Once Marcine Matar, MD      . 0.9 %  sodium chloride infusion   Intravenous Once Marcine Matar, MD      . acetaminophen (TYLENOL) tablet 650 mg  650 mg Oral Q6H PRN [name], MD   650 mg at 01/20/16 0645   Or  . acetaminophen (TYLENOL) suppository 650 mg  650 mg Rectal Q6H PRN [name], MD      . ALPRAZolam Prudy Feeler) tablet 0.5 mg  0.5 mg Oral TID PRN [name], MD   0.5 mg at 01/21/16 2246  . amitriptyline (ELAVIL) tablet 25 mg  25 mg Oral QHS [name], MD   25 mg at 01/22/16 2232  . atorvastatin (LIPITOR) tablet 20 mg  20 mg Oral Daily [name], MD   20 mg at 01/22/16 1030  . dutasteride (AVODART) capsule 0.5 mg  0.5 mg Oral Daily Ramiro Harvest V, MD   0.5 mg at 01/22/16 1030  . enoxaparin (LOVENOX) injection 30 mg  30 mg Subcutaneous QHS [name], MD   30 mg at 01/22/16 2223  . fluconazole (DIFLUCAN) IVPB 100 mg  100 mg Intravenous Q24H [name], MD   100 mg at 01/22/16 0841  . gi cocktail (Maalox,Lidocaine,Donnatal)  30 mL Oral TID PRN [name], MD      . insulin aspart (novoLOG) injection 0-9 Units  0-9 Units Subcutaneous TID WC [name], MD   9 Units at 01/22/16 1755  . insulin aspart (novoLOG) injection 4 Units  4 Units Subcutaneous TID WC Mutaz Elmahi, MD      . insulin glargine (LANTUS) injection 10 Units  10 Units Subcutaneous Daily [name], MD   10 Units at 01/22/16 0849  . ipratropium (ATROVENT) nebulizer solution 0.5 mg  0.5 mg Nebulization Q2H PRN [name], MD      . levalbuterol [facility]) nebulizer solution 0.63 mg  0.63 mg Nebulization Q2H PRN [name], MD      . metoprolol (LOPRESSOR) tablet 50 mg  50 mg Oral BID [name], MD   50 mg at 01/22/16 2222  . morphine 2 MG/ML injection 1-2 mg  1-2 mg  Intravenous Q4H PRN Rodolph Bonganiel Thompson V, MD   2 mg at 01/19/16 1507  . ondansetron (ZOFRAN) tablet 4 mg  4 mg Oral Q6H PRN Rodolph Bonganiel Thompson V, MD       Or  . ondansetron [facility](ZOFRAN) injection 4 mg  4 mg Intravenous Q6H PRN Rodolph Bonganiel Thompson V, MD      . oxyCODONE (Oxy IR/ROXICODONE) immediate release tablet 5 mg  5 mg Oral Q4H PRN Rodolph Bonganiel Thompson V, MD   5 mg at 01/22/16 1707  . pantoprazole (PROTONIX) EC tablet 40 mg  40 mg Oral Q0600 Rodolph Bonganiel Thompson V, MD   40 mg at 01/23/16 0553  . polyethylene glycol (MIRALAX / GLYCOLAX) packet 17 g  17 g Oral BID Rodolph Bonganiel Thompson V, MD   17 g at 01/22/16 2222  . senna (SENOKOT) tablet 8.6 mg  1 tablet Oral BID Rodolph Bonganiel Thompson V, MD   8.6 mg at 01/22/16 2222  . sorbitol 70 % solution 30 mL  30 mL Oral Daily PRN Rodolph Bonganiel Thompson V, MD      . triamcinolone cream (KENALOG) 0.1 % 1 application  1 application Topical BID PRN Rodolph Bonganiel Thompson V, MD      . Warfarin - Pharmacist Dosing Inpatient   Does not apply q1800 Rodolph Bonganiel Thompson V, MD   5  each at 01/20/16 1800     Objective: Vital signs in last 24 hours: Temp:  [97.8 F (36.6 C)-98.8 F (37.1 C)] 98.8 F (37.1 C) (04/13 0510) Pulse Rate:  [66-68] 68 (04/13 0510) Resp:  [18-20] 20 (04/13 0510) BP: (139-160)/(65-89) 160/65 mmHg (04/13 0510) SpO2:  [95 %-100 %] 95 % (04/13 0510) Weight:  [74.435 kg (164 lb 1.6 oz)] 74.435 kg (164 lb 1.6 oz) (04/13 0510)  Intake/Output from previous day: 04/12 0701 - 04/13 0700 In: 240 [P.O.:240] Out: 2275 [Urine:2275] Intake/Output this shift:     Physical Exam  Constitutional: He is well-developed, well-nourished, and in no distress.  Genitourinary:  Urine clear in tubing    Lab Results:   Recent Labs  01/22/16 0338 01/23/16 0352  WBC 6.0 6.1  HGB 9.3* 9.9*  HCT 26.6* 29.1*  PLT 157 189   BMET  Recent Labs  01/22/16 0338 01/23/16 0352  NA 139 139  K 4.1 4.8  CL 99* 99*  CO2 26 27  GLUCOSE 259* 300*  BUN 66* 74*  CREATININE 4.48* 4.89*  CALCIUM 7.9* 8.0*   PT/INR  Recent Labs  01/22/16 0338 01/23/16 0352  LABPROT 20.7* 25.8*  INR 1.78* 2.39*   ABG No results for input(s): PHART, HCO3 in the last 72 hours.  Invalid input(s): PCO2, PO2  Studies/Results: No results found. Labs and notes reviewed.  Assessment and Plan: He has progressive ARI despite the stent.  He will need f/u in our office in 3-4 weeks for stent removal.  I don't see much point in maintaining a stent longterm if it isn't going to improve his function.   The foley can be removed if no longer needed for medical management.           LOS: 6 days    Paul Guerra,Paul Guerra 01/23/2016 [phone]

## 2016-01-23 NOTE — Progress Notes (Signed)
[facility] PROGRESS NOTE  Paul LodgeMax B Guerra ZOX:[id number] DOB: [birth year] DOA: 01/17/2016 PCP: No primary care provider on file.   HPI/Subjective: Seen with daughter at bedside, feels okay denies any new complaints.  Assessment/Plan:  Acute renal failure Questionable etiology. Likely secondary to a post renal azotemia as patient noted to have a right hydronephrosis PET/CT scan from outside hospital versus prerenal azotemia as patient noted to have borderline hypotension in the setting of ACE inhibitor. Foley catheter was placed at outside hospital after diagnosis of UTI. Urinalysis with large leukocytes nitrite negative to numerous to count WBCs. Urine cultures pending. FENA = 5.5%. Renal function with no significant improvement since admission and creatinine worsening from admission currently at 4.63 from 4.10 from 3.87 from 3.90 from 3.7 on 01/17/2016. Renal ultrasound with moderate right hydronephrosis.  D/C bicarbonate drip. Continue empiric Levaquin and Diflucan to cover for yeast as per urology recommendations.  Patient s/p cystoscopy with right ureteral stenting 01/20/2016.  Per nephrology ATN versus AIN. Slight worsening since yesterday, creatinine is 4.89, NS infusion at 50 mL/hour Repeat the ultrasound and follow.   Metabolic acidosis Likely secondary to problem #1. Resolved.   Chest pain Likely secondary to pneumonia. Patient with no further chest pain today. On 01/18/2016 patient stated chest pain lasted only 30 seconds with both typical and atypical features. Patient with prior cardiac history. EKG with atrial fibrillation. Cardiac enzymes  negative 3. Patient with no further chest pain. No further cardiac workup needed. Continue empiric antibiotics and treat pneumonia.  Urinary tract infection/candiduria Urine cultures with greater than 100,000 yeast. On Diflucan, patient is on levofloxacin discontinued 4/12.  Atrial fibrillation Continue metoprolol for rate control. INR  today is 2.39, Coumadin per pharmacy. Was on Levaquin.   Hypertension Continue Norvasc and metoprolol. Lisinopril on hold secondary to worsening renal function.  Hyperlipidemia Lipitor.  Right hydronephrosis/probable kidney stones Per CT from outside hospital. Renal ultrasound with moderate right hydronephrosis and chronic UPJ stenosis. No evidence of left hydronephrosis area and left lower pole renal calculi. Patient status post cystoscopy and right retrograde pyelogram with double-J stent placement 01/20/2016. Per urology.  Constipation Resolved with enema. Continue MiraLAX twice daily as well as Senokot for bowel regimen.  Community acquired pneumonia Sputum Gram stain and culture pending. Urine strep pneumococcus antigen negative. Urine Legionella antigen pending. Change IV Levaquin to oral Levaquin. Follow.  Dehydration Saline lock IV fluids.   Chronic anemia No overt bleeding. Hemoglobin currently at 10.4 from 6.9 patient status post 2 units packed red blood cells. Follow H&H.   BPH Status post Foley catheter placement. Continue Avodart. Patient s/p cystoscopy and right double-J stent placement 01/20/2016. Per urology.  Diabetes mellitus Hemoglobin A1c is 9.7. CBGs have ranged from 171-278. Started Lantus at 20 units daily decreased from home dose as patient acute renal failure. Continue Lantus at 10 units daily. Sliding scale insulin. Titrate as needed.   #Prophylaxis PPI for GI prophylaxis. INR currently subtherapeutic at 1.59. Coumadin per pharmacy. Will place on prophylactic Lovenox until INR is therapeutic.   Code Status: Full Family Communication: Updated patient. No family at bedside.  Disposition Plan: Home when medically stable, and renal function close to baseline and clinical improvement.   Consultants:  Urology: Dr.Wrenn 01/17/2016  Nephrology: Dr. Darrick Pennaeterding 01/21/2016  Procedures:  Renal ultrasound 01/18/2016  Chest x-ray 01/17/2016  Renal US  01/18/2016  2 units PRBCs 01/20/2016  Cystoscopy, right retrograde ureteropyelogram, right double-J stent placement. Dr.Dahlstedt 01/20/2016  Antibiotics:  IV Levaquin 01/17/2016>>>> 01/21/2016  Oral Levaquin  for 12 2017    Objective: Filed Vitals:   01/22/16 2224 01/23/16 0510  BP: 139/68 160/65  Pulse: 68 68  Temp: 97.9 F (36.6 C) 98.8 F (37.1 C)  Resp: 20 20    Intake/Output Summary (Last 24 hours) at 01/23/16 1227 Last data filed at 01/22/16 2220  Gross per 24 hour  Intake      0 ml  Output   1725 ml  Net  -1725 ml   Filed Weights   01/21/16 0624 01/22/16 0623 01/23/16 0510  Weight: 80.513 kg (177 lb 8 oz) 75.796 kg (167 lb 1.6 oz) 74.435 kg (164 lb 1.6 oz)    Exam:   General:  NAD  Cardiovascular: Irregularly irregular  Respiratory: CTAB  Abdomen: Soft/ND/NTTP,+bs  Musculoskeletal: No c/c/e  Data Reviewed: Basic Metabolic Panel:  Recent Labs Lab 01/17/16 1618 01/18/16 0405  01/19/16 0358 01/19/16 0913 01/20/16 0355 01/21/16 0347 01/22/16 0338 01/23/16 0352  NA 144 140  < > 143  --  141 139 139 139  K 4.5 3.8  < > 4.0  --  3.8 4.6 4.1 4.8  CL 115* 114*  < > 110  --  102 101 99* 99*  CO2 13* 14*  < > 21*  --  GLUCOSE 60* 138*  < > 233* 229* 137* 349* 259* 300*  BUN 67* 73*  < > 66*  --  65* 67* 66* 74*  CREATININE 3.70* 3.90*  < > 3.87*  --  4.10* 4.63* 4.48* 4.89*  CALCIUM 8.3* 8.3*  < > 7.9*  --  7.8* 7.9* 7.9* 8.0*  MG 1.6* 1.9  --   --   --   --   --   --   --   PHOS 7.7*  --   --   --   --  5.4* 6.1* 6.6* 6.7*  < > = values in this interval not displayed. Liver Function Tests:  Recent Labs Lab 01/17/16 1618 01/20/16 0355 01/21/16 0347 01/22/16 0338 01/23/16 0352  AST 43*  --   --  29  --   ALT 59  --   --  36  --   ALKPHOS 78  --   --  63  --   BILITOT 0.5  --   --  0.6  --   PROT 6.9  --   --  5.9*  --   ALBUMIN 3.5 2.9* 3.2* 3.0* 3.0*   No results for input(s): LIPASE, AMYLASE in the last 168 hours. No  results for input(s): AMMONIA in the last 168 hours. CBC:  Recent Labs Lab 01/17/16 1618  01/19/16 0358 01/20/16 0355 01/20/16 1537 01/21/16 0347 01/22/16 0338 01/23/16 0352  WBC 5.9  < > 5.6 5.7  --  6.0 6.0 6.1  NEUTROABS 4.2  --   --   --   --  4.2  --   --   HGB 8.3*  < > 7.3* 6.9* 11.3* 10.4* 9.3* 9.9*  HCT 23.1*  < > 20.5* 19.3* 32.4* 29.7* 26.6* 29.1*  MCV 85.6  < > 84.7 83.9  --  84.6 84.7 87.4  PLT 213  < > 174 154  --  165 157 189  < > = values in this interval not displayed. Cardiac Enzymes:  Recent Labs Lab 01/18/16 1116 01/18/16 1701 01/18/16 2243 01/21/16 1419  CKTOTAL  --   --   --  75  TROPONINI <0.03 <0.03 0.03  --  BNP (last 3 results) No results for input(s): BNP in the last 8760 hours.  ProBNP (last 3 results) No results for input(s): PROBNP in the last 8760 hours.  CBG:  Recent Labs Lab 01/22/16 1244 01/22/16 1740 01/22/16 2206 01/23/16 0750 01/23/16 1155  GLUCAP 292* 391* 239* 277* 275*    Recent Results (from the past 240 hour(s))  Culture, blood (routine x 2) Call MD if unable to obtain prior to antibiotics being given     Status: None   Collection Time: 01/17/16  4:18 PM  Result Value Ref Range Status   Specimen Description BLOOD RIGHT ARM  Final   Special Requests IN PEDIATRIC BOTTLE 2 CC  Final   Culture   Final    NO GROWTH 5 DAYS Performed at [facility]    Report Status 01/22/2016 FINAL  Final  Culture, blood (routine x 2) Call MD if unable to obtain prior to antibiotics being given     Status: None   Collection Time: 01/17/16  4:37 PM  Result Value Ref Range Status   Specimen Description BLOOD RIGHT HAND  Final   Special Requests BOTTLES DRAWN AEROBIC ONLY 5 CC  Final   Culture   Final    NO GROWTH 5 DAYS Performed at [facility]    Report Status 01/22/2016 FINAL  Final  Urine culture     Status: Abnormal   Collection Time: 01/17/16  5:14 PM  Result Value Ref Range Status   Specimen Description  URINE, RANDOM  Final   Special Requests NONE  Final   Culture >=100,000 COLONIES/mL YEAST (A)  Final   Report Status 01/19/2016 FINAL  Final     Studies: No results found.  Scheduled Meds: . sodium chloride   Intravenous Once  . sodium chloride   Intravenous Once  . amitriptyline  25 mg Oral QHS  . atorvastatin  20 mg Oral Daily  . dutasteride  0.5 mg Oral Daily  . fluconazole  100 mg Oral Daily  . insulin aspart  0-9 Units Subcutaneous TID WC  . insulin aspart  4 Units Subcutaneous TID WC  . insulin glargine  10 Units Subcutaneous Daily  . lanthanum  1,000 mg Oral TID WC  . metoprolol tartrate  50 mg Oral BID  . pantoprazole  40 mg Oral Q0600  . polyethylene glycol  17 g Oral BID  . senna  1 tablet Oral BID  . warfarin  1 mg Oral ONCE-1800  . Warfarin - Pharmacist Dosing Inpatient   Does not apply q1800   Continuous Infusions: . sodium chloride 50 mL/hr at 01/23/16 1150    Principal Problem:   ARF (acute renal failure) (HCC) Active Problems:   CAP (community acquired pneumonia)   Metabolic acidosis   Dehydration   Anemia, chronic disease   Hydronephrosis   Recurrent UTI   Recurrent nephrolithiasis   Chronic a-fib (HCC)   DM (diabetes mellitus) (HCC)   BPH (benign prostatic hyperplasia)   Constipation   HTN (hypertension), benign   Hyperlipidemia   Hydronephrosis of right kidney   Chest pain    Time spent: 35 mins    [facility] A MD [facility] Pager [phone]. If 7PM-7AM, please contact night-coverage at [email or web address], password [facility] 01/23/2016, 12:27 PM  LOS: 6 days

## 2016-01-24 ENCOUNTER — Inpatient Hospital Stay (HOSPITAL_COMMUNITY): Payer: Medicare Other

## 2016-01-24 LAB — CBC
HCT: 27.7 % — ABNORMAL LOW (ref 39.0–52.0)
HEMOGLOBIN: 9.5 g/dL — AB (ref 13.0–17.0)
MCH: 30 pg (ref 26.0–34.0)
MCHC: 34.3 g/dL (ref 30.0–36.0)
MCV: 87.4 fL (ref 78.0–100.0)
PLATELETS: 161 10*3/uL (ref 150–400)
RBC: 3.17 MIL/uL — AB (ref 4.22–5.81)
RDW: 13.3 % (ref 11.5–15.5)
WBC: 6.2 10*3/uL (ref 4.0–10.5)

## 2016-01-24 LAB — GLUCOSE, CAPILLARY
GLUCOSE-CAPILLARY: 176 mg/dL — AB (ref 65–99)
GLUCOSE-CAPILLARY: 231 mg/dL — AB (ref 65–99)
Glucose-Capillary: 156 mg/dL — ABNORMAL HIGH (ref 65–99)
Glucose-Capillary: 253 mg/dL — ABNORMAL HIGH (ref 65–99)

## 2016-01-24 LAB — RENAL FUNCTION PANEL
ANION GAP: 14 (ref 5–15)
Albumin: 2.9 g/dL — ABNORMAL LOW (ref 3.5–5.0)
BUN: 75 mg/dL — ABNORMAL HIGH (ref 6–20)
CALCIUM: 8 mg/dL — AB (ref 8.9–10.3)
CHLORIDE: 101 mmol/L (ref 101–111)
CO2: 23 mmol/L (ref 22–32)
Creatinine, Ser: 5.02 mg/dL — ABNORMAL HIGH (ref 0.61–1.24)
GFR calc non Af Amer: 10 mL/min — ABNORMAL LOW (ref >60)
GFR, EST AFRICAN AMERICAN: 12 mL/min — AB (ref >60)
GLUCOSE: 160 mg/dL — AB (ref 65–99)
Phosphorus: 7.1 mg/dL — ABNORMAL HIGH (ref 2.5–4.6)
Potassium: 4.5 mmol/L (ref 3.5–5.1)
SODIUM: 138 mmol/L (ref 135–145)

## 2016-01-24 LAB — SODIUM, URINE, RANDOM: SODIUM UR: 74 mmol/L

## 2016-01-24 LAB — CREATININE, URINE, RANDOM: CREATININE, URINE: 41.44 mg/dL

## 2016-01-24 LAB — PROTIME-INR
INR: 2.24 — ABNORMAL HIGH (ref 0.00–1.49)
PROTHROMBIN TIME: 24.6 s — AB (ref 11.6–15.2)

## 2016-01-24 MED ORDER — WARFARIN SODIUM 2 MG PO TABS
2.0000 mg | ORAL_TABLET | Freq: Once | ORAL | Status: AC
  Administered 2016-01-24: 2 mg via ORAL
  Filled 2016-01-24: qty 1

## 2016-01-24 MED ORDER — METOPROLOL TARTRATE 25 MG PO TABS
25.0000 mg | ORAL_TABLET | Freq: Two times a day (BID) | ORAL | Status: DC
  Administered 2016-01-24 – 2016-01-31 (×13): 25 mg via ORAL
  Filled 2016-01-24 (×14): qty 1

## 2016-01-24 MED ORDER — WARFARIN SODIUM 2.5 MG PO TABS: 2.5000 mg | ORAL_TABLET | Freq: Once | ORAL | Status: DC

## 2016-01-24 NOTE — Progress Notes (Signed)
Subjective: Interval History: has no complaint .  Objective: Vital signs in last 24 hours: Temp:  [97.9 F (36.6 C)-98.7 F (37.1 C)] 97.9 F (36.6 C) (04/13 2050) Pulse Rate:  [61-67] 67 (04/14 1021) Resp:  [18] 18 (04/13 2050) BP: (106-151)/(64-80) 106/69 mmHg (04/14 1021) SpO2:  [93 %-98 %] 93 % (04/13 2050) Weight:  [74.753 kg (164 lb 12.8 oz)] 74.753 kg (164 lb 12.8 oz) (04/13 2050) Weight change: 0.318 kg (11.2 oz)  Intake/Output from previous day: 04/13 0701 - 04/14 0700 In: 680 [P.O.:680] Out: 3000 [Urine:3000] Intake/Output this shift: Total I/O In: 480 [P.O.:480] Out: -   General appearance: cooperative, pale and slowed mentation Resp: clear to auscultation bilaterally Cardio: S1, S2 normal and systolic murmur: holosystolic 2/6, blowing at apex GI: soft, non-tender; bowel sounds normal; no masses,  no organomegaly Extremities: extremities normal, atraumatic, no cyanosis or edema  Lab Results:  Recent Labs  01/23/16 0352 01/24/16 0355  WBC 6.1 6.2  HGB 9.9* 9.5*  HCT 29.1* 27.7*  PLT 189 161   BMET:  Recent Labs  01/23/16 0352 01/24/16 0355  NA 139 138  K 4.8 4.5  CL 99* 101  CO2 27 23  GLUCOSE 300* 160*  BUN 74* 75*  CREATININE 4.89* 5.02*  CALCIUM 8.0* 8.0*    Recent Labs  01/21/16 1421  PTH 78*   Iron Studies: No results for input(s): IRON, TIBC, TRANSFERRIN, FERRITIN in the last 72 hours.  Studies/Results: [place] Renal  01/24/2016  CLINICAL DATA:  Acute kidney injury EXAM: RENAL / URINARY TRACT ULTRASOUND COMPLETE COMPARISON:  01/18/2016 FINDINGS: Right Kidney: Length: 10.6 cm. Increased echotexture. Small 10 mm cyst in the lower pole. Previously seen hydronephrosis has resolved. Left Kidney: Length: 10.2 cm. Echogenicity within normal limits. No mass or hydronephrosis visualized. Bladder: Foley catheter in place. IMPRESSION: Resolution of previously seen right hydronephrosis. Increased echotexture in the right kidney. Small benign-appearing  cyst in the lower pole of the right kidney. Electronically Signed   By: Charlett NoseKevin  Dover M.D.   On: 01/24/2016 08:23    I have reviewed the patient's current medications.  Assessment/Plan: 1 AKI still trending up . Mild acidemia. On binders, vol ok. bp low, lower metop 2 Hydro resolved on U/S 3 DM controlled 4 Anemia stable 5 UTI with yeast on Fluc P cont conservative tx, if no better, need to consider bx next week, but high risk and still suspect ATN    LOS: 7 days   Marquize Seib L 01/24/2016,10:54 AM

## 2016-01-24 NOTE — Progress Notes (Addendum)
ANTICOAGULATION CONSULT NOTE - Follow Up Consult  Pharmacy Consult for Warfarin Indication: atrial fibrillation  Allergies  Allergen Reactions  . Nitroglycerin Anaphylaxis  . Penicillins Anaphylaxis, Hives and Shortness Of Breath    Has patient had a PCN reaction causing immediate rash, facial/tongue/throat swelling, SOB or lightheadedness with hypotension: Yes Has patient had a PCN reaction causing severe rash involving mucus membranes or skin necrosis: Yes Has patient had a PCN reaction that required hospitalization Yes Has patient had a PCN reaction occurring within the last 10 years: No If all of the above answers are "NO", then may proceed with Cephalosporin use.   . Sudafed  [Pseudoephedrine Hcl] Hives  . Tetracycline Shortness Of Breath    Other reaction(s): SHORTNESS OF BREATH  . Vancomycin Hives  . Doxycycline Rash    Other reaction(s): RASH  . Sulfa Antibiotics Rash    Patient Measurements: Height: 6\' 3"  (190.5 cm) Weight: 164 lb 12.8 oz (74.753 kg) IBW/kg (Calculated) : 84.5  Vital Signs: BP: 106/69 mmHg (04/14 1021) Pulse Rate: 67 (04/14 1021)  Labs:  Recent Labs  01/21/16 1419  01/22/16 0338 01/23/16 0352 01/24/16 0355  HGB  --   < > 9.3* 9.9* 9.5*  HCT  --   --  26.6* 29.1* 27.7*  PLT  --   --  157 189 161  LABPROT  --   --  20.7* 25.8* 24.6*  INR  --   --  1.78* 2.39* 2.24*  CREATININE  --   --  4.48* 4.89* 5.02*  CKTOTAL 75  --   --   --   --   < > = values in this interval not displayed.  Estimated Creatinine Clearance: 12.8 mL/min (by C-G formula based on Cr of 5.02).   Assessment: [age] y/oM with PMH of anemia, atrial fibrillation on warfarin, DM, BPH, remote history of colon cancer who is currently admitted for acute renal failure, CAP, and UTI. Pharmacy consulted to assist with dosing warfarin while patient in the hospital.   Home dose is 2.5mg  qday except 5mg  on T/R/Sat  Today, 01/24/2016:  INR therapeutic   CBC: Hgb low, stable, plts  WNL  No bleeding reported/documented.  Underwent cystoscopy, right retrograde ureteropyelogram, right double-J stent placement. No bleeding noted in procedure note.    AKI - SCr remains elevated and rising   Tolerating diet.  Major drug interactions: Fluconazole can increase INR response.  Lovenox d/c'd due to INR > 2  Goal of Therapy:  INR 2-3 Monitor platelets by anticoagulation protocol: Yes   Plan:  1) Warfarin 2mg  x 1 tonight (conservative due to fluconazole DDI) 2) Daily INR  Haynes Hoehnolleen Andranik Jeune, PharmD, BCPS 01/24/2016, 12:36 PM  Pager: [phone]

## 2016-01-24 NOTE — Care Management Important Message (Signed)
Important Message  Patient Details  Name: Paul Guerra MRN: [id number] Date of Birth: Oct 19, 1934   Medicare Important Message Given:  Yes    Haskell FlirtJamison, Britny Riel 01/24/2016, 9:08 AMImportant Message  Patient Details  Name: Paul Guerra MRN: [id number] Date of Birth: Oct 19, 1934   Medicare Important Message Given:  Yes    Haskell FlirtJamison, Zoltan Genest 01/24/2016, 9:08 AM

## 2016-01-24 NOTE — Progress Notes (Signed)
PROGRESS NOTE  Paul Guerra:[id number] DOB: 24-Sep-1935 DOA: 01/17/2016 PCP: No primary care provider on file. Outpatient Specialists:    LOS: 7 days   Subjective: Patient was lying in bed comfortably. No new complaints other than wanting coffee and toast for breakfast.   Brief Narrative: Transfer from [facility] on 01/17/16. Patient is a 81 year old male with history of pancreatitis, DM, HTN, recurrent UTI, recurrent kidney stones, atrial fibrillation, and colon cancer (right hemicolectomy). Presented to [facility] ED with elevated creatinine, probable UTI, and pneumonia. He has been having urinary frequency, dysuria, confusion, and suprapubic abdominal pain over the past month. He saw PCP 2 days prior to admission and was started on ciprofloxacin for UTI, but on his way home his PCP called to tell him to go to ED for IV fluids and antibiotics. Blood pressure was 97/51, CT showed hydronephrosis and kidney stones, and chest x-ray showed pneumonia. Sent as transferred to [facility].   Assessment & Plan: Principal Problem:   ARF (acute renal failure) (HCC) Active Problems:   CAP (community acquired pneumonia)   Metabolic acidosis   Dehydration   Anemia, chronic disease   Hydronephrosis   Recurrent UTI   Recurrent nephrolithiasis   Chronic a-fib (HCC)   DM (diabetes mellitus) (HCC)   BPH (benign prostatic hyperplasia)   Constipation   HTN (hypertension), benign   Hyperlipidemia   Hydronephrosis of right kidney   Chest pain    Acute renal failure - Questionable etiology. Likely secondary to a post renal azotemia as patient noted to have a right hydronephrosis PET/CT scan from outside hospital versus prerenal azotemia as patient noted to have borderline hypotension in the setting of ACE inhibitor. Foley catheter was placed at outside hospital after diagnosis of UTI. Urinalysis with large leukocytes nitrite negative to numerous to count WBCs. FENA = 5.5% - Patient s/p  cystoscopy with right ureteral stenting 01/20/2016. - Worsening creatinine daily, currently 5.02 from 3.7 on 4/7  - Renal ultrasound with moderate right hydronephrosis, resolved 4/14 - Continue empiric Levaquin and Diflucan to cover for yeast on UA, as per urology recommendations.  - Per nephrology 4/14: Continue conservative treatment. If no improvement, consider biopsy next week. High risk, still suspect ATN  Metabolic acidosis - Resolved.   Chest pain - 4/8/ patient stated chest pain lasted only 30 seconds with both typical and atypical features. - Patient with prior cardiac history. EKG with atrial fibrillation. Cardiac enzymes negative 3. - Resolved   Urinary tract infection/candiduria - Urine cultures with greater than 100,000 yeast.  - Levofloxacin discontinued 4/12. - Diflucan started on 4/13  Atrial fibrillation - Continue metoprolol for rate control - INR today is 2.24  - Currently on coumadin  Hypertension - Lisinopril on hold secondary to worsening renal function - Nephrology advised to lower metoprolol 4/14  Hyperlipidemia - Continue Lipitor  Right hydronephrosis/probable kidney stones - Per CT from outside hospital.  - Renal ultrasound with moderate right hydronephrosis and chronic UPJ stenosis. No evidence of left hydronephrosis area and left lower pole renal calculi. Patient status post cystoscopy and right retrograde pyelogram with double-J stent placement 01/20/2016 - Resolved per urology   Constipation - Continue MiraLAX twice daily as well as Senokot for bowel regimen.  Community acquired pneumonia - Sputum Gram stain and culture pending.  - Urine strep pneumococcus antigen negative.  - Urine Legionella antigen pending.  - Change IV Levaquin to oral Levaquin.  - Follow.  Dehydration - Saline lock IV fluids  Chronic anemia - No overt bleeding. - Status post 2 units PRBC on 4/10  - Hemoglobin currently 9.5 - Follow H&H.   BPH - Status post  Foley catheter placement.  - Continue Avodart.  - Patient s/p cystoscopy and right double-J stent placement 01/20/2016. Per urology.  Diabetes mellitus - Hemoglobin A1c on 4/7 was 9.7.  - CBGs have been consistently high.  - Continue Lantus at 10 units daily started on 4/10. Sliding scale insulin. Titrate as needed.   Code Status: Full Family Communication: Updated patient and daughter at bedside. Disposition Plan: Home when medically stable, and renal function close to baseline and clinical improvement and per urology.   Consultants: Urology Nephrology  Procedures: Renal ultrasound 01/18/2016 Chest x-ray 01/17/2016 Renal US 01/18/2016 and 01/24/2016 2 units PRBCs 01/20/2016  Antibiotics: IV Levaquin 01/17/2016  Objective: Filed Vitals:   01/23/16 0510 01/23/16 1356 01/23/16 2050 01/24/16 1021  BP: 160/65 151/80 124/64 106/69  Pulse: 68 61 62 67  Temp: 98.8 F (37.1 C) 98.7 F (37.1 C) 97.9 F (36.6 C)   TempSrc: Oral Tympanic Oral   Resp: 20 18 18    Height:      Weight: 74.435 kg (164 lb 1.6 oz)  74.753 kg (164 lb 12.8 oz)   SpO2: 95% 98% 93%     Intake/Output Summary (Last 24 hours) at 01/24/16 1436 Last data filed at 01/24/16 1343  Gross per 24 hour  Intake   1520 ml  Output   3750 ml  Net  -2230 ml   Filed Weights   01/22/16 0623 01/23/16 0510 01/23/16 2050  Weight: 75.796 kg (167 lb 1.6 oz) 74.435 kg (164 lb 1.6 oz) 74.753 kg (164 lb 12.8 oz)    Examination:  Filed Vitals:   01/23/16 0510 01/23/16 1356 01/23/16 2050 01/24/16 1021  BP: 160/65 151/80 124/64 106/69  Pulse: 68 61 62 67  Temp: 98.8 F (37.1 C) 98.7 F (37.1 C) 97.9 F (36.6 C)   TempSrc: Oral Tympanic Oral   Resp: 20 18 18    Height:      Weight: 74.435 kg (164 lb 1.6 oz)  74.753 kg (164 lb 12.8 oz)   SpO2: 95% 98% 93%    Constitutional: well nourished, well developed, NAD. ENMT: Mucous membranes are moist.  Respiratory: clear to auscultation bilaterally, no wheezing, no crackles.  Normal respiratory effort. No accessory muscle use.  Cardiovascular: Regular rate and rhythm. No extremity edema. Abdomen: Soft, non tender, non distended. Bowel sounds positive.  Musculoskeletal: no clubbing / cyanosis.  Neurologic: non focal  Psychiatric: Slow mentation.  Alert and oriented x 3. Normal mood.    Data Reviewed: I have personally reviewed following labs and imaging studies  CBC:  Recent Labs Lab 01/17/16 1618  01/20/16 0355 01/20/16 1537 01/21/16 0347 01/22/16 0338 01/23/16 0352 01/24/16 0355  WBC 5.9  < > 5.7  --  6.0 6.0 6.1 6.2  NEUTROABS 4.2  --   --   --  4.2  --   --   --   HGB 8.3*  < > 6.9* 11.3* 10.4* 9.3* 9.9* 9.5*  HCT 23.1*  < > 19.3* 32.4* 29.7* 26.6* 29.1* 27.7*  MCV 85.6  < > 83.9  --  84.6 84.7 87.4 87.4  PLT 213  < > 154  --  165 157 189 161  < > = values in this interval not displayed. Basic Metabolic Panel:  Recent Labs Lab 01/17/16 1618 01/18/16 0405  01/20/16 0355 01/21/16 0347 01/22/16 0338 01/23/16  0352 01/24/16 0355  NA 144 140  < > 141 139 139 139 138  K 4.5 3.8  < > 3.8 4.6 4.1 4.8 4.5  CL 115* 114*  < > 102 101 99* 99* 101  CO2 13* 14*  < > GLUCOSE 60* 138*  < > 137* 349* 259* 300* 160*  BUN 67* 73*  < > 65* 67* 66* 74* 75*  CREATININE 3.70* 3.90*  < > 4.10* 4.63* 4.48* 4.89* 5.02*  CALCIUM 8.3* 8.3*  < > 7.8* 7.9* 7.9* 8.0* 8.0*  MG 1.6* 1.9  --   --   --   --   --   --   PHOS 7.7*  --   --  5.4* 6.1* 6.6* 6.7* 7.1*  < > = values in this interval not displayed. GFR: Estimated Creatinine Clearance: 12.8 mL/min (by C-G formula based on Cr of 5.02). Liver Function Tests:  Recent Labs Lab 01/17/16 1618 01/20/16 0355 01/21/16 0347 01/22/16 0338 01/23/16 0352 01/24/16 0355  AST 43*  --   --  29  --   --   ALT 59  --   --  36  --   --   ALKPHOS 78  --   --  63  --   --   BILITOT 0.5  --   --  0.6  --   --   PROT 6.9  --   --  5.9*  --   --   ALBUMIN 3.5 2.9* 3.2* 3.0* 3.0* 2.9*   Coagulation  Profile:  Recent Labs Lab 01/20/16 0355 01/21/16 0347 01/22/16 0338 01/23/16 0352 01/24/16 0355  INR 1.74* 1.59* 1.78* 2.39* 2.24*   Cardiac Enzymes:  Recent Labs Lab 01/18/16 1116 01/18/16 1701 01/18/16 2243 01/21/16 1419  CKTOTAL  --   --   --  75  TROPONINI <0.03 <0.03 0.03  --     Recent Labs Lab 01/23/16 1155 01/23/16 1649 01/23/16 2047 01/24/16 0735 01/24/16 1149  GLUCAP 275* 112* 206* 156* 176*   Urine analysis:    Component Value Date/Time   COLORURINE YELLOW 01/21/2016 1434   APPEARANCEUR CLOUDY* 01/21/2016 1434   LABSPEC 1.013 01/21/2016 1434   PHURINE 7.0 01/21/2016 1434   GLUCOSEU 500* 01/21/2016 1434   HGBUR LARGE* 01/21/2016 1434   BILIRUBINUR NEGATIVE 01/21/2016 1434   KETONESUR NEGATIVE 01/21/2016 1434   PROTEINUR 30* 01/21/2016 1434   UROBILINOGEN 0.2 05/30/2011 1144   NITRITE NEGATIVE 01/21/2016 1434   LEUKOCYTESUR MODERATE* 01/21/2016 1434   Sepsis Labs: Invalid input(s): PROCALCITONIN, LACTICIDVEN  Recent Results (from the past 240 hour(s))  Culture, blood (routine x 2) Call MD if unable to obtain prior to antibiotics being given     Status: None   Collection Time: 01/17/16  4:18 PM  Result Value Ref Range Status   Specimen Description BLOOD RIGHT ARM  Final   Special Requests IN PEDIATRIC BOTTLE 2 CC  Final   Culture   Final    NO GROWTH 5 DAYS Performed at [facility]    Report Status 01/22/2016 FINAL  Final  Culture, blood (routine x 2) Call MD if unable to obtain prior to antibiotics being given     Status: None   Collection Time: 01/17/16  4:37 PM  Result Value Ref Range Status   Specimen Description BLOOD RIGHT HAND  Final   Special Requests BOTTLES DRAWN AEROBIC ONLY 5 CC  Final   Culture   Final  NO GROWTH 5 DAYS Performed at [facility]    Report Status 01/22/2016 FINAL  Final  Urine culture     Status: Abnormal   Collection Time: 01/17/16  5:14 PM  Result Value Ref Range Status   Specimen  Description URINE, RANDOM  Final   Special Requests NONE  Final   Culture >=100,000 COLONIES/mL YEAST (A)  Final   Report Status 01/19/2016 FINAL  Final      Radiology Studies: US Renal  01/24/2016  CLINICAL DATA:  Acute kidney injury EXAM: RENAL / URINARY TRACT ULTRASOUND COMPLETE COMPARISON:  01/18/2016 FINDINGS: Right Kidney: Length: 10.6 cm. Increased echotexture. Small 10 mm cyst in the lower pole. Previously seen hydronephrosis has resolved. Left Kidney: Length: 10.2 cm. Echogenicity within normal limits. No mass or hydronephrosis visualized. Bladder: Foley catheter in place. IMPRESSION: Resolution of previously seen right hydronephrosis. Increased echotexture in the right kidney. Small benign-appearing cyst in the lower pole of the right kidney. Electronically Signed   By: Charlett Nose M.D.   On: 01/24/2016 08:23     Scheduled Meds: . sodium chloride   Intravenous Once  . sodium chloride   Intravenous Once  . amitriptyline  25 mg Oral QHS  . atorvastatin  20 mg Oral Daily  . dutasteride  0.5 mg Oral Daily  . fluconazole  100 mg Oral Daily  . insulin aspart  0-9 Units Subcutaneous TID WC  . insulin aspart  4 Units Subcutaneous TID WC  . insulin glargine  10 Units Subcutaneous Daily  . lanthanum  1,000 mg Oral TID WC  . metoprolol tartrate  25 mg Oral BID  . pantoprazole  40 mg Oral Q0600  . polyethylene glycol  17 g Oral BID  . senna  1 tablet Oral BID  . warfarin  2 mg Oral ONCE-1800  . Warfarin - Pharmacist Dosing Inpatient   Does not apply q1800   Continuous Infusions: . sodium chloride 50 mL/hr at 01/24/16 1021   Takiah Maiden A, MD [facility] Pager [phone]  If 7PM-7AM, please contact night-coverage [email or web address] Password [facility] 01/24/2016, 2:36 PM

## 2016-01-25 LAB — RENAL FUNCTION PANEL
ALBUMIN: 3 g/dL — AB (ref 3.5–5.0)
Anion gap: 13 (ref 5–15)
BUN: 74 mg/dL — AB (ref 6–20)
CO2: 21 mmol/L — ABNORMAL LOW (ref 22–32)
CREATININE: 4.85 mg/dL — AB (ref 0.61–1.24)
Calcium: 8 mg/dL — ABNORMAL LOW (ref 8.9–10.3)
Chloride: 104 mmol/L (ref 101–111)
GFR, EST AFRICAN AMERICAN: 12 mL/min — AB (ref >60)
GFR, EST NON AFRICAN AMERICAN: 10 mL/min — AB (ref >60)
Glucose, Bld: 155 mg/dL — ABNORMAL HIGH (ref 65–99)
PHOSPHORUS: 7.1 mg/dL — AB (ref 2.5–4.6)
POTASSIUM: 4.4 mmol/L (ref 3.5–5.1)
Sodium: 138 mmol/L (ref 135–145)

## 2016-01-25 LAB — CBC
HEMATOCRIT: 26.7 % — AB (ref 39.0–52.0)
Hemoglobin: 9.3 g/dL — ABNORMAL LOW (ref 13.0–17.0)
MCH: 30.2 pg (ref 26.0–34.0)
MCHC: 34.8 g/dL (ref 30.0–36.0)
MCV: 86.7 fL (ref 78.0–100.0)
Platelets: 171 10*3/uL (ref 150–400)
RBC: 3.08 MIL/uL — AB (ref 4.22–5.81)
RDW: 13.3 % (ref 11.5–15.5)
WBC: 5.6 10*3/uL (ref 4.0–10.5)

## 2016-01-25 LAB — PROTIME-INR
INR: 2.29 — AB (ref 0.00–1.49)
Prothrombin Time: 25 seconds — ABNORMAL HIGH (ref 11.6–15.2)

## 2016-01-25 LAB — GLUCOSE, CAPILLARY
GLUCOSE-CAPILLARY: 157 mg/dL — AB (ref 65–99)
GLUCOSE-CAPILLARY: 148 mg/dL — AB (ref 65–99)
Glucose-Capillary: 209 mg/dL — ABNORMAL HIGH (ref 65–99)
Glucose-Capillary: 219 mg/dL — ABNORMAL HIGH (ref 65–99)

## 2016-01-25 MED ORDER — WARFARIN SODIUM 2 MG PO TABS
2.0000 mg | ORAL_TABLET | Freq: Once | ORAL | Status: AC
  Administered 2016-01-25: 2 mg via ORAL
  Filled 2016-01-25: qty 1

## 2016-01-25 NOTE — Progress Notes (Signed)
ANTICOAGULATION CONSULT NOTE - Follow Up Consult  Pharmacy Consult for Warfarin Indication: atrial fibrillation  Allergies  Allergen Reactions  . Nitroglycerin Anaphylaxis  . Penicillins Anaphylaxis, Hives and Shortness Of Breath    Has patient had a PCN reaction causing immediate rash, facial/tongue/throat swelling, SOB or lightheadedness with hypotension: Yes Has patient had a PCN reaction causing severe rash involving mucus membranes or skin necrosis: Yes Has patient had a PCN reaction that required hospitalization Yes Has patient had a PCN reaction occurring within the last 10 years: No If all of the above answers are "NO", then may proceed with Cephalosporin use.   . Sudafed  [Pseudoephedrine Hcl] Hives  . Tetracycline Shortness Of Breath    Other reaction(s): SHORTNESS OF BREATH  . Vancomycin Hives  . Doxycycline Rash    Other reaction(s): RASH  . Sulfa Antibiotics Rash    Patient Measurements: Height: 6\' 3"  (190.5 cm) Weight: 164 lb 12.8 oz (74.753 kg) IBW/kg (Calculated) : 84.5  Vital Signs: Temp: 98.1 F (36.7 C) (04/14 2106) Temp Source: Oral (04/14 2106) BP: 140/53 mmHg (04/14 2106) Pulse Rate: 62 (04/14 2106)  Labs:  Recent Labs  01/23/16 0352 01/24/16 0355 01/25/16 0353  HGB 9.9* 9.5* 9.3*  HCT 29.1* 27.7* 26.7*  PLT 189 161 171  LABPROT 25.8* 24.6* 25.0*  INR 2.39* 2.24* 2.29*  CREATININE 4.89* 5.02* 4.85*    Estimated Creatinine Clearance: 13.3 mL/min (by C-G formula based on Cr of 4.85).   Assessment: [age] y/oM with PMH of anemia, atrial fibrillation on warfarin, DM, BPH, remote history of colon cancer who is currently admitted for acute renal failure, CAP, and UTI. Pharmacy consulted to assist with dosing warfarin while patient in the hospital.   Home dose is 2.5mg  qday except 5mg  on T/R/Sat  Today, 01/25/2016:  INR therapeutic   CBC: Hgb low, stable, plts WNL  No bleeding reported/documented.  Underwent cystoscopy, right retrograde  ureteropyelogram, right double-J stent placement. No bleeding noted in procedure note.    AKI - SCr remains elevated and leveling off it appears  Tolerating diet.  Major drug interactions: Fluconazole can increase INR response.  Lovenox d/c'd due to INR > 2  Goal of Therapy:  INR 2-3 Monitor platelets by anticoagulation protocol: Yes   Plan:  1) Repeat Warfarin 2mg  x 1 tonight (conservative due to fluconazole DDI) 2) Daily INR   Hessie KnowsJustin M Elijha Dedman, PharmD, BCPS Pager [phone] 01/25/2016 9:06 AM

## 2016-01-25 NOTE — Progress Notes (Signed)
Subjective: Interval History: has no complaint , would like to get home.  Objective: Vital signs in last 24 hours: Temp:  [97.6 F (36.4 C)-98.1 F (36.7 C)] 98.1 F (36.7 C) (04/14 2106) Pulse Rate:  [60-67] 62 (04/14 2106) Resp:  [16] 16 (04/14 2106) BP: (106-152)/(53-69) 140/53 mmHg (04/14 2106) SpO2:  [99 %-100 %] 100 % (04/14 2106) Weight change:   Intake/Output from previous day: 04/14 0701 - 04/15 0700 In: 2850 [P.O.:1290; I.V.:1560] Out: 3150 [Urine:3150] Intake/Output this shift:    General appearance: alert, cooperative, no distress and pale Resp: diminished breath sounds bilaterally Cardio: regular rate and rhythm, S1, S2 normal and systolic murmur: holosystolic 2/6, blowing at apex GI: soft, non-tender; bowel sounds normal; no masses,  no organomegaly Extremities: extremities normal, atraumatic, no cyanosis or edema  Lab Results:  Recent Labs  01/24/16 0355 01/25/16 0353  WBC 6.2 5.6  HGB 9.5* 9.3*  HCT 27.7* 26.7*  PLT 161 171   BMET:  Recent Labs  01/24/16 0355 01/25/16 0353  NA 138 138  K 4.5 4.4  CL 101 104  CO2 23 21*  GLUCOSE 160* 155*  BUN 75* 74*  CREATININE 5.02* 4.85*  CALCIUM 8.0* 8.0*   No results for input(s): PTH in the last 72 hours. Iron Studies: No results for input(s): IRON, TIBC, TRANSFERRIN, FERRITIN in the last 72 hours.  Studies/Results: [place] Renal  01/24/2016  CLINICAL DATA:  Acute kidney injury EXAM: RENAL / URINARY TRACT ULTRASOUND COMPLETE COMPARISON:  01/18/2016 FINDINGS: Right Kidney: Length: 10.6 cm. Increased echotexture. Small 10 mm cyst in the lower pole. Previously seen hydronephrosis has resolved. Left Kidney: Length: 10.2 cm. Echogenicity within normal limits. No mass or hydronephrosis visualized. Bladder: Foley catheter in place. IMPRESSION: Resolution of previously seen right hydronephrosis. Increased echotexture in the right kidney. Small benign-appearing cyst in the lower pole of the right kidney.  Electronically Signed   By: Charlett NoseKevin  Dover M.D.   On: 01/24/2016 08:23    I have reviewed the patient's current medications.  Assessment/Plan: 1 AKI nonoliguric, mildly better Cr but ? If signif. But not worse, vol [facility].mild acidemia, K ok 2 Nephrolithiasis with obstruction 3 Yeast UTI 4 Pneu 5 DM controlled P ivf, follow chem,vol , acid base.    LOS: 8 days   Kuuipo Anzaldo L 01/25/2016,10:19 AM

## 2016-01-25 NOTE — Progress Notes (Signed)
PROGRESS NOTE  Paul Guerra WUJ:[id number] DOB: 10/23/1934 DOA: 01/17/2016 PCP: No primary care provider on file. Outpatient Specialists:    LOS: 8 days   Subjective: Seen with daughter at bedside, denies any new complaints. Slightly improved creatinine since yesterday.  Brief Narrative: [facility] on [facility]/17/16. Patient is a 81 year old male with history of pancreatitis, DM, HTN, recurrent UTI, recurrent kidney stones, atrial fibrillation, and colon cancer (right hemicolectomy). [facility]ed [facility] with elevated creatinine, probable UTI, and pneumonia. He has been having urinary frequency, dysuria, confusion, and suprapubic abdominal pain over the past month. He saw PCP 2 days prior to admission and was started on ciprofloxacin for UTI, but on his way home his PCP called to tell him to go to ED for IV fluids and antibiotics. Blood pressure was 97/51, CT showed hydronephrosis and kidney stones, and chest x-ray showed pneumonia. Sent as [facility].   Assessment & Plan: Principal Problem:   ARF (acute renal failure) (HCC) Active Problems:   CAP (community acquired pneumonia)   Metabolic acidosis   Dehydration   Anemia, chronic disease   Hydronephrosis   Recurrent UTI   Recurrent nephrolithiasis   Chronic a-fib (HCC)   DM (diabetes mellitus) (HCC)   BPH (benign prostatic hyperplasia)   Constipation   HTN (hypertension), benign   Hyperlipidemia   Hydronephrosis of right kidney   Chest pain    Acute renal failure - Questionable etiology. Likely secondary to a post renal azotemia as patient noted to have a right hydronephrosis PET/CT scan from outside hospital versus prerenal azotemia as patient noted to have borderline hypotension in the setting of ACE inhibitor. Foley catheter was placed at outside hospital after diagnosis of UTI. Urinalysis with large leukocytes nitrite negative to numerous to count WBCs. FENA = 5.5% - Patient s/p  cystoscopy with right ureteral stenting 01/20/2016. - Worsening creatinine daily, currently 5.02 from 3.7 on 4/7  - Renal ultrasound with moderate right hydronephrosis, resolved 4/14 - Continue empiric Levaquin and Diflucan to cover for yeast on UA, as per urology recommendations.  - Continue IV fluid, nonoliguric, made 3.1 L of urine last night, creatinine slightly improved from 5.0 to 4.8  Metabolic acidosis - Resolved.   Chest pain - 4/8/ patient stated chest pain lasted only 30 seconds with both typical and atypical features. - Patient with prior cardiac history. EKG with atrial fibrillation. Cardiac enzymes negative 3. - Resolved   Urinary tract infection/candiduria - Urine cultures with greater than 100,000 yeast.  - Levofloxacin discontinued 4/12. - Diflucan started on 4/13.  Atrial fibrillation - Continue metoprolol for rate control - INR today is 2.29  - Currently on coumadin  Hypertension - Lisinopril on hold secondary to worsening renal function - Nephrology advised to lower metoprolol 4/14  Hyperlipidemia - Continue Lipitor  Right hydronephrosis/probable kidney stones - Per CT from outside hospital.  - Renal ultrasound with moderate right hydronephrosis and chronic UPJ stenosis. No evidence of left hydronephrosis area and left lower pole renal calculi. Patient status post cystoscopy and right retrograde pyelogram with double-J stent placement 01/20/2016 - Resolved per urology   Constipation - Continue MiraLAX twice daily as well as Senokot for bowel regimen.  Community acquired pneumonia - Sputum Gram stain and culture pending.  - Urine strep pneumococcus antigen negative.  - Urine Legionella antigen pending.  - On oral levofloxacin.  Dehydration - Saline lock IV fluids   Chronic anemia - No overt bleeding. - Status post  2 units PRBC on 4/10  - Hemoglobin currently 9.5 - Follow H&H.   BPH - Status post Foley catheter placement.  - Continue  Avodart.  - Patient s/p cystoscopy and right double-J stent placement 01/20/2016. Per urology.  Diabetes mellitus - Hemoglobin A1c on 4/7 was 9.7.  - CBGs have been consistently high.  - Continue Lantus at 10 units daily started on 4/10. Sliding scale insulin. Titrate as needed.   Code Status: Full Family Communication: Updated patient and daughter at bedside. Disposition Plan: Home when medically stable, and renal function close to baseline and clinical improvement and per urology.   Consultants: Urology Nephrology  Procedures: Renal ultrasound 01/18/2016 Chest x-ray 01/17/2016 Renal US 01/18/2016 and 01/24/2016 2 units PRBCs 01/20/2016  Antibiotics: IV Levaquin 01/17/2016  Objective: Filed Vitals:   01/23/16 2050 01/24/16 1021 01/24/16 1447 01/24/16 2106  BP: 124/64 106/69 152/60 140/53  Pulse: 62 67 60 62  Temp: 97.9 F (36.6 C)  97.6 F (36.4 C) 98.1 F (36.7 C)  TempSrc: Oral  Oral Oral  Resp: 18  16 16   Height:      Weight: 74.753 kg (164 lb 12.8 oz)     SpO2: 93%  99% 100%    Intake/Output Summary (Last 24 hours) at 01/25/16 1142 Last data filed at 01/25/16 0259  Gross per 24 hour  Intake   2370 ml  Output   2400 ml  Net    -30 ml   Filed Weights   01/22/16 0623 01/23/16 0510 01/23/16 2050  Weight: 75.796 kg (167 lb 1.6 oz) 74.435 kg (164 lb 1.6 oz) 74.753 kg (164 lb 12.8 oz)    Examination:  Filed Vitals:   01/23/16 2050 01/24/16 1021 01/24/16 1447 01/24/16 2106  BP: 124/64 106/69 152/60 140/53  Pulse: 62 67 60 62  Temp: 97.9 F (36.6 C)  97.6 F (36.4 C) 98.1 F (36.7 C)  TempSrc: Oral  Oral Oral  Resp: 18  16 16   Height:      Weight: 74.753 kg (164 lb 12.8 oz)     SpO2: 93%  99% 100%   Constitutional: well nourished, well developed, NAD. ENMT: Mucous membranes are moist.  Respiratory: clear to auscultation bilaterally, no wheezing, no crackles. Normal respiratory effort. No accessory muscle use.  Cardiovascular: Regular rate and rhythm. No  extremity edema. Abdomen: Soft, non tender, non distended. Bowel sounds positive.  Musculoskeletal: no clubbing / cyanosis.  Neurologic: non focal  Psychiatric: Slow mentation.  Alert and oriented x 3. Normal mood.    Data Reviewed: I have personally reviewed following labs and imaging studies  CBC:  Recent Labs Lab 01/21/16 0347 01/22/16 0338 01/23/16 0352 01/24/16 0355 01/25/16 0353  WBC 6.0 6.0 6.1 6.2 5.6  NEUTROABS 4.2  --   --   --   --   HGB 10.4* 9.3* 9.9* 9.5* 9.3*  HCT 29.7* 26.6* 29.1* 27.7* 26.7*  MCV 84.6 84.7 87.4 87.4 86.7  PLT 165 157 189 161 171   Basic Metabolic Panel:  Recent Labs Lab 01/21/16 0347 01/22/16 0338 01/23/16 0352 01/24/16 0355 01/25/16 0353  NA 139 139 139 138 138  K 4.6 4.1 4.8 4.5 4.4  CL 101 99* 99* 101 104  CO2 26 26 27 23  21*  GLUCOSE 349* 259* 300* 160* 155*  BUN 67* 66* 74* 75* 74*  CREATININE 4.63* 4.48* 4.89* 5.02* 4.85*  CALCIUM 7.9* 7.9* 8.0* 8.0* 8.0*  PHOS 6.1* 6.6* 6.7* 7.1* 7.1*   GFR: Estimated Creatinine Clearance: 13.3  mL/min (by C-G formula based on Cr of 4.85). Liver Function Tests:  Recent Labs Lab 01/21/16 0347 01/22/16 0338 01/23/16 0352 01/24/16 0355 01/25/16 0353  AST  --  29  --   --   --   ALT  --  36  --   --   --   ALKPHOS  --  63  --   --   --   BILITOT  --  0.6  --   --   --   PROT  --  5.9*  --   --   --   ALBUMIN 3.2* 3.0* 3.0* 2.9* 3.0*   Coagulation Profile:  Recent Labs Lab 01/21/16 0347 01/22/16 0338 01/23/16 0352 01/24/16 0355 01/25/16 0353  INR 1.59* 1.78* 2.39* 2.24* 2.29*   Cardiac Enzymes:  Recent Labs Lab 01/18/16 1701 01/18/16 2243 01/21/16 1419  CKTOTAL  --   --  75  TROPONINI <0.03 0.03  --     Recent Labs Lab 01/24/16 1149 01/24/16 1710 01/24/16 2052 01/25/16 0749 01/25/16 1121  GLUCAP 176* 253* 231* 157* 148*   Urine analysis:    Component Value Date/Time   COLORURINE YELLOW 01/21/2016 1434   APPEARANCEUR CLOUDY* 01/21/2016 1434   LABSPEC  1.013 01/21/2016 1434   PHURINE 7.0 01/21/2016 1434   GLUCOSEU 500* 01/21/2016 1434   HGBUR LARGE* 01/21/2016 1434   BILIRUBINUR NEGATIVE 01/21/2016 1434   KETONESUR NEGATIVE 01/21/2016 1434   PROTEINUR 30* 01/21/2016 1434   UROBILINOGEN 0.2 05/30/2011 1144   NITRITE NEGATIVE 01/21/2016 1434   LEUKOCYTESUR MODERATE* 01/21/2016 1434   Sepsis Labs: Invalid input(s): PROCALCITONIN, LACTICIDVEN  Recent Results (from the past 240 hour(s))  Culture, blood (routine x 2) Call MD if unable to obtain prior to antibiotics being given     Status: None   Collection Time: 01/17/16  4:18 PM  Result Value Ref Range Status   Specimen Description BLOOD RIGHT ARM  Final   Special Requests IN PEDIATRIC BOTTLE 2 CC  Final   Culture   Final    NO GROWTH 5 DAYS Performed at [facility]    Report Status 01/22/2016 FINAL  Final  Culture, blood (routine x 2) Call MD if unable to obtain prior to antibiotics being given     Status: None   Collection Time: 01/17/16  4:37 PM  Result Value Ref Range Status   Specimen Description BLOOD RIGHT HAND  Final   Special Requests BOTTLES DRAWN AEROBIC ONLY 5 CC  Final   Culture   Final    NO GROWTH 5 DAYS Performed at [facility]    Report Status 01/22/2016 FINAL  Final  Urine culture     Status: Abnormal   Collection Time: 01/17/16  5:14 PM  Result Value Ref Range Status   Specimen Description URINE, RANDOM  Final   Special Requests NONE  Final   Culture >=100,000 COLONIES/mL YEAST (A)  Final   Report Status 01/19/2016 FINAL  Final      Radiology Studies: US Renal  01/24/2016  CLINICAL DATA:  Acute kidney injury EXAM: RENAL / URINARY TRACT ULTRASOUND COMPLETE COMPARISON:  01/18/2016 FINDINGS: Right Kidney: Length: 10.6 cm. Increased echotexture. Small 10 mm cyst in the lower pole. Previously seen hydronephrosis has resolved. Left Kidney: Length: 10.2 cm. Echogenicity within normal limits. No mass or hydronephrosis visualized. Bladder: Foley  catheter in place. IMPRESSION: Resolution of previously seen right hydronephrosis. Increased echotexture in the right kidney. Small benign-appearing cyst in the lower pole of the  right kidney. Electronically Signed   By: Charlett Nose M.D.   On: 01/24/2016 08:23     Scheduled Meds: . sodium chloride   Intravenous Once  . sodium chloride   Intravenous Once  . amitriptyline  25 mg Oral QHS  . atorvastatin  20 mg Oral Daily  . dutasteride  0.5 mg Oral Daily  . fluconazole  100 mg Oral Daily  . insulin aspart  0-9 Units Subcutaneous TID WC  . insulin aspart  4 Units Subcutaneous TID WC  . insulin glargine  10 Units Subcutaneous Daily  . lanthanum  1,000 mg Oral TID WC  . metoprolol tartrate  25 mg Oral BID  . pantoprazole  40 mg Oral Q0600  . polyethylene glycol  17 g Oral BID  . senna  1 tablet Oral BID  . warfarin  2 mg Oral ONCE-1800  . Warfarin - Pharmacist Dosing Inpatient   Does not apply q1800   Continuous Infusions: . sodium chloride 50 mL/hr at 01/24/16 1021   Harveen Flesch A, MD [facility] Pager [phone]  If 7PM-7AM, please contact night-coverage [email or web address] Password [facility] 01/25/2016, 11:42 AM

## 2016-01-26 LAB — RENAL FUNCTION PANEL
Albumin: 2.9 g/dL — ABNORMAL LOW (ref 3.5–5.0)
Anion gap: 13 (ref 5–15)
BUN: 73 mg/dL — AB (ref 6–20)
CALCIUM: 8.2 mg/dL — AB (ref 8.9–10.3)
CHLORIDE: 104 mmol/L (ref 101–111)
CO2: 21 mmol/L — ABNORMAL LOW (ref 22–32)
CREATININE: 4.96 mg/dL — AB (ref 0.61–1.24)
GFR calc Af Amer: 12 mL/min — ABNORMAL LOW (ref >60)
GFR, EST NON AFRICAN AMERICAN: 10 mL/min — AB (ref >60)
Glucose, Bld: 198 mg/dL — ABNORMAL HIGH (ref 65–99)
Phosphorus: 7.5 mg/dL — ABNORMAL HIGH (ref 2.5–4.6)
Potassium: 5.1 mmol/L (ref 3.5–5.1)
SODIUM: 138 mmol/L (ref 135–145)

## 2016-01-26 LAB — GLUCOSE, CAPILLARY
GLUCOSE-CAPILLARY: 172 mg/dL — AB (ref 65–99)
Glucose-Capillary: 151 mg/dL — ABNORMAL HIGH (ref 65–99)
Glucose-Capillary: 253 mg/dL — ABNORMAL HIGH (ref 65–99)
Glucose-Capillary: 274 mg/dL — ABNORMAL HIGH (ref 65–99)

## 2016-01-26 LAB — PROTIME-INR
INR: 2.51 — AB (ref 0.00–1.49)
PROTHROMBIN TIME: 26.8 s — AB (ref 11.6–15.2)

## 2016-01-26 MED ORDER — WARFARIN SODIUM 1 MG PO TABS
1.0000 mg | ORAL_TABLET | Freq: Once | ORAL | Status: AC
  Administered 2016-01-26: 1 mg via ORAL
  Filled 2016-01-26: qty 1

## 2016-01-26 NOTE — Progress Notes (Signed)
ANTICOAGULATION CONSULT NOTE - Follow Up Consult  Pharmacy Consult for Warfarin Indication: atrial fibrillation  Allergies  Allergen Reactions  . Nitroglycerin Anaphylaxis  . Penicillins Anaphylaxis, Hives and Shortness Of Breath    Has patient had a PCN reaction causing immediate rash, facial/tongue/throat swelling, SOB or lightheadedness with hypotension: Yes Has patient had a PCN reaction causing severe rash involving mucus membranes or skin necrosis: Yes Has patient had a PCN reaction that required hospitalization Yes Has patient had a PCN reaction occurring within the last 10 years: No If all of the above answers are "NO", then may proceed with Cephalosporin use.   . Sudafed  [Pseudoephedrine Hcl] Hives  . Tetracycline Shortness Of Breath    Other reaction(s): SHORTNESS OF BREATH  . Vancomycin Hives  . Doxycycline Rash    Other reaction(s): RASH  . Sulfa Antibiotics Rash    Patient Measurements: Height: 6\' 3"  (190.5 cm) Weight: 164 lb 12.8 oz (74.753 kg) IBW/kg (Calculated) : 84.5  Vital Signs: Temp: 97.1 F (36.2 C) (04/16 0439) Temp Source: Axillary (04/16 0439) BP: 162/57 mmHg (04/16 0439) Pulse Rate: 52 (04/16 0439)  Labs:  Recent Labs  01/24/16 0355 01/25/16 0353 01/26/16 0431  HGB 9.5* 9.3*  --   HCT 27.7* 26.7*  --   PLT 161 171  --   LABPROT 24.6* 25.0* 26.8*  INR 2.24* 2.29* 2.51*  CREATININE 5.02* 4.85* 4.96*    Estimated Creatinine Clearance: 13 mL/min (by C-G formula based on Cr of 4.96).   Assessment: [age] y/oM with PMH of anemia, atrial fibrillation on warfarin, DM, BPH, remote history of colon cancer who is currently admitted for acute renal failure, CAP, and UTI. Pharmacy consulted to assist with dosing warfarin while patient in the hospital.   Home dose is 2.5mg  qday except 5mg  on T/R/Sat  Today, 01/26/2016:  INR therapeutic   CBC: Hgb low, stable, plts WNL  No bleeding reported/documented.  Underwent cystoscopy, right retrograde  ureteropyelogram, right double-J stent placement. No bleeding noted in procedure note.    AKI - SCr remains elevated and leveling off it appears  Tolerating diet.  Major drug interactions: Fluconazole can increase INR response.  Lovenox d/c'd due to INR > 2  Goal of Therapy:  INR 2-3 Monitor platelets by anticoagulation protocol: Yes   Plan:  1) Decrease Warfarin to 1mg  x 1 tonight (conservative due to fluconazole DDI) 2) Daily INR   Hessie KnowsJustin M Denice Cardon, PharmD, BCPS Pager [phone] 01/26/2016 9:27 AM

## 2016-01-26 NOTE — Progress Notes (Signed)
Subjective: Interval History: has no complaint , tired.  Objective: Vital signs in last 24 hours: Temp:  [97.1 F (36.2 C)-98.3 F (36.8 C)] 97.1 F (36.2 C) (04/16 0439) Pulse Rate:  [52-69] 52 (04/16 0439) Resp:  [16-18] 16 (04/16 0439) BP: (138-162)/(57-63) 162/57 mmHg (04/16 0439) SpO2:  [94 %-98 %] 95 % (04/16 0439) Weight:  [74.753 kg (164 lb 12.8 oz)] 74.753 kg (164 lb 12.8 oz) (04/16 0439) Weight change:   Intake/Output from previous day: 04/15 0701 - 04/16 0700 In: 1195 [P.O.:1195] Out: 5100 [Urine:5100] Intake/Output this shift:    General appearance: cooperative, no distress, pale and slowed mentation Resp: clear to auscultation bilaterally Cardio: regular rate and rhythm, S1, S2 normal and systolic murmur: holosystolic 2/6, blowing at apex GI: soft, non-tender; bowel sounds normal; no masses,  no organomegaly Extremities: extremities normal, atraumatic, no cyanosis or edema  Lab Results:  Recent Labs  01/24/16 0355 01/25/16 0353  WBC 6.2 5.6  HGB 9.5* 9.3*  HCT 27.7* 26.7*  PLT 161 171   BMET:  Recent Labs  01/25/16 0353 01/26/16 0431  NA 138 138  K 4.4 5.1  CL 104 104  CO2 21* 21*  GLUCOSE 155* 198*  BUN 74* 73*  CREATININE 4.85* 4.96*  CALCIUM 8.0* 8.2*   No results for input(s): PTH in the last 72 hours. Iron Studies: No results for input(s): IRON, TIBC, TRANSFERRIN, FERRITIN in the last 72 hours.  Studies/Results: No results found.  I have reviewed the patient's current medications.  Assessment/Plan: 1 AKI good urine vol.  No recovery of function.  Will be patient as should recover . Mild acidemia 2 Anemia stable 3 r hydro  4 Yeast UTI 5 DM controlled 6 Hx renal stones P cont ivf, follow chem     LOS: 9 days   Marthena Whitmyer L 01/26/2016,10:13 AM

## 2016-01-26 NOTE — Progress Notes (Signed)
PROGRESS NOTE  Paul Guerra ZOX:[id number] DOB: 1934/10/25 DOA: 01/17/2016 PCP: No primary care provider on file. Outpatient Specialists:    LOS: 9 days   Subjective: Seen with daughter at bedside, creatinine is still elevated. Good urine output of 5.1 L yesterday. He maintains his potassium homeostasis and non-oliguric. Nephrology please advise if patient needs to stay in the hospital until his creatinine recovers.  Brief Narrative: [facility]. Patient is a 81 year old male with history of pancreatitis, DM, HTN, recurrent UTI, recurrent kidney stones, atrial fibrillation, and colon cancer (right hemicolectomy). [facility] with elevated creatinine, probable UTI, and pneumonia. He has been having urinary frequency, dysuria, confusion, and suprapubic abdominal pain over the past month. He saw PCP 2 days prior to admission and was started on ciprofloxacin for UTI, but on his way home his PCP called to tell him to go to ED for IV fluids and antibiotics. Blood pressure was 97/51, CT showed hydronephrosis and kidney stones, and chest x-ray showed pneumonia. [facility]   Assessment & Plan: Principal Problem:   ARF (acute renal failure) (HCC) Active Problems:   CAP (community acquired pneumonia)   Metabolic acidosis   Dehydration   Anemia, chronic disease   Hydronephrosis   Recurrent UTI   Recurrent nephrolithiasis   Chronic a-fib (HCC)   DM (diabetes mellitus) (HCC)   BPH (benign prostatic hyperplasia)   Constipation   HTN (hypertension), benign   Hyperlipidemia   Hydronephrosis of right kidney   Chest pain    Acute renal failure - Questionable etiology. Likely secondary to a post renal azotemia as patient noted to have a right hydronephrosis PET/CT scan from outside hospital versus prerenal azotemia as patient noted to have borderline hypotension in the setting of ACE inhibitor. Foley catheter was placed at  outside hospital after diagnosis of UTI. Urinalysis with large leukocytes nitrite negative to numerous to count WBCs. FENA = 5.5% - Patient s/p cystoscopy with right ureteral stenting 01/20/2016. - Worsening creatinine daily, currently 5.02 from 3.7 on 4/7  - Renal ultrasound with moderate right hydronephrosis, resolved 4/14 - Continue empiric Diflucan to cover for yeast on UA, as per urology recommendations.   Metabolic acidosis - Resolved.   Chest pain - 4/8/ patient stated chest pain lasted only 30 seconds with both typical and atypical features. - Patient with prior cardiac history. EKG with atrial fibrillation. Cardiac enzymes negative 3. - Resolved   Urinary tract infection/candiduria - Urine cultures with greater than 100,000 yeast.  - Levofloxacin discontinued 4/12. - Diflucan started on 4/13.  Atrial fibrillation - Continue metoprolol for rate control - INR today is 2.29  - Currently on coumadin  Hypertension - Lisinopril on hold secondary to worsening renal function - Nephrology advised to lower metoprolol 4/14  Hyperlipidemia - Continue Lipitor  Right hydronephrosis/probable kidney stones - Per CT from outside hospital.  - Renal ultrasound with moderate right hydronephrosis and chronic UPJ stenosis. No evidence of left hydronephrosis area and left lower pole renal calculi. Patient status post cystoscopy and right retrograde pyelogram with double-J stent placement 01/20/2016 - Resolved per urology   Constipation - Continue MiraLAX twice daily as well as Senokot for bowel regimen.  Community acquired pneumonia - Sputum Gram stain and culture pending.  - Urine strep pneumococcus antigen negative.  - Urine Legionella antigen pending.  - On oral levofloxacin.  Dehydration - Saline lock IV fluids   Chronic anemia - No overt bleeding. -  Status post 2 units PRBC on 4/10  - Hemoglobin currently 9.5 - Follow H&H.   BPH - Status post Foley catheter placement.   - Continue Avodart.  - Patient s/p cystoscopy and right double-J stent placement 01/20/2016. Per urology.  Diabetes mellitus - Hemoglobin A1c on 4/7 was 9.7.  - CBGs have been consistently high.  - Continue Lantus at 10 units daily started on 4/10. Sliding scale insulin. Titrate as needed.   Code Status: Full Family Communication: Updated patient and daughter at bedside. Disposition Plan: Home when medically stable, and renal function close to baseline and clinical improvement and per urology.   Consultants: Urology Nephrology  Procedures: Renal ultrasound 01/18/2016 Chest x-ray 01/17/2016 Renal US 01/18/2016 and 01/24/2016 2 units PRBCs 01/20/2016  Antibiotics: IV Levaquin 01/17/2016  Objective: Filed Vitals:   01/25/16 2052 01/26/16 0439 01/26/16 1257 01/26/16 1307  BP: 145/58 162/57 165/51 165/51  Pulse: 59 52 57 57  Temp: 97.4 F (36.3 C) 97.1 F (36.2 C) 97.9 F (36.6 C)   TempSrc: Axillary Axillary Axillary   Resp: Height:      Weight:  74.753 kg (164 lb 12.8 oz)    SpO2: 94% 95% 96%     Intake/Output Summary (Last 24 hours) at 01/26/16 1307 Last data filed at 01/26/16 1300  Gross per 24 hour  Intake    835 ml  Output   4750 ml  Net  -3915 ml   Filed Weights   01/23/16 0510 01/23/16 2050 01/26/16 0439  Weight: 74.435 kg (164 lb 1.6 oz) 74.753 kg (164 lb 12.8 oz) 74.753 kg (164 lb 12.8 oz)    Examination:  Filed Vitals:   01/25/16 2052 01/26/16 0439 01/26/16 1257 01/26/16 1307  BP: 145/58 162/57 165/51 165/51  Pulse: 59 52 57 57  Temp: 97.4 F (36.3 C) 97.1 F (36.2 C) 97.9 F (36.6 C)   TempSrc: Axillary Axillary Axillary   Resp: Height:      Weight:  74.753 kg (164 lb 12.8 oz)    SpO2: 94% 95% 96%    Constitutional: well nourished, well developed, NAD. ENMT: Mucous membranes are moist.  Respiratory: clear to auscultation bilaterally, no wheezing, no crackles. Normal respiratory effort. No accessory muscle use.    Cardiovascular: Regular rate and rhythm. No extremity edema. Abdomen: Soft, non tender, non distended. Bowel sounds positive.  Musculoskeletal: no clubbing / cyanosis.  Neurologic: non focal  Psychiatric: Slow mentation.  Alert and oriented x 3. Normal mood.    Data Reviewed: I have personally reviewed following labs and imaging studies  CBC:  Recent Labs Lab 01/21/16 0347 01/22/16 0338 01/23/16 0352 01/24/16 0355 01/25/16 0353  WBC 6.0 6.0 6.1 6.2 5.6  NEUTROABS 4.2  --   --   --   --   HGB 10.4* 9.3* 9.9* 9.5* 9.3*  HCT 29.7* 26.6* 29.1* 27.7* 26.7*  MCV 84.6 84.7 87.4 87.4 86.7  PLT 165 157 189 161 171   Basic Metabolic Panel:  Recent Labs Lab 01/22/16 0338 01/23/16 0352 01/24/16 0355 01/25/16 0353 01/26/16 0431  NA 139 139 138 138 138  K 4.1 4.8 4.5 4.4 5.1  CL 99* 99* 101 104 104  CO2 21* 21*  GLUCOSE 259* 300* 160* 155* 198*  BUN 66* 74* 75* 74* 73*  CREATININE 4.48* 4.89* 5.02* 4.85* 4.96*  CALCIUM 7.9* 8.0* 8.0* 8.0* 8.2*  PHOS 6.6* 6.7* 7.1* 7.1* 7.5*   GFR: Estimated Creatinine  Clearance: 13 mL/min (by C-G formula based on Cr of 4.96). Liver Function Tests:  Recent Labs Lab 01/22/16 0338 01/23/16 0352 01/24/16 0355 01/25/16 0353 01/26/16 0431  AST 29  --   --   --   --   ALT 36  --   --   --   --   ALKPHOS 63  --   --   --   --   BILITOT 0.6  --   --   --   --   PROT 5.9*  --   --   --   --   ALBUMIN 3.0* 3.0* 2.9* 3.0* 2.9*   Coagulation Profile:  Recent Labs Lab 01/22/16 0338 01/23/16 0352 01/24/16 0355 01/25/16 0353 01/26/16 0431  INR 1.78* 2.39* 2.24* 2.29* 2.51*   Cardiac Enzymes:  Recent Labs Lab 01/21/16 1419  CKTOTAL 75    Recent Labs Lab 01/25/16 1121 01/25/16 1651 01/25/16 2205 01/26/16 0735 01/26/16 1128  GLUCAP 148* 209* 219* 172* 151*   Urine analysis:    Component Value Date/Time   COLORURINE YELLOW 01/21/2016 1434   APPEARANCEUR CLOUDY* 01/21/2016 1434   LABSPEC 1.013 01/21/2016 1434    PHURINE 7.0 01/21/2016 1434   GLUCOSEU 500* 01/21/2016 1434   HGBUR LARGE* 01/21/2016 1434   BILIRUBINUR NEGATIVE 01/21/2016 1434   KETONESUR NEGATIVE 01/21/2016 1434   PROTEINUR 30* 01/21/2016 1434   UROBILINOGEN 0.2 05/30/2011 1144   NITRITE NEGATIVE 01/21/2016 1434   LEUKOCYTESUR MODERATE* 01/21/2016 1434   Sepsis Labs: Invalid input(s): PROCALCITONIN, LACTICIDVEN  Recent Results (from the past 240 hour(s))  Culture, blood (routine x 2) Call MD if unable to obtain prior to antibiotics being given     Status: None   Collection Time: 01/17/16  4:18 PM  Result Value Ref Range Status   Specimen Description BLOOD RIGHT ARM  Final   Special Requests IN PEDIATRIC BOTTLE 2 CC  Final   Culture   Final    NO GROWTH 5 DAYS Performed at [facility]    Report Status 01/22/2016 FINAL  Final  Culture, blood (routine x 2) Call MD if unable to obtain prior to antibiotics being given     Status: None   Collection Time: 01/17/16  4:37 PM  Result Value Ref Range Status   Specimen Description BLOOD RIGHT HAND  Final   Special Requests BOTTLES DRAWN AEROBIC ONLY 5 CC  Final   Culture   Final    NO GROWTH 5 DAYS Performed at [facility]    Report Status 01/22/2016 FINAL  Final  Urine culture     Status: Abnormal   Collection Time: 01/17/16  5:14 PM  Result Value Ref Range Status   Specimen Description URINE, RANDOM  Final   Special Requests NONE  Final   Culture >=100,000 COLONIES/mL YEAST (A)  Final   Report Status 01/19/2016 FINAL  Final      Radiology Studies: No results found.   Scheduled Meds: . sodium chloride   Intravenous Once  . sodium chloride   Intravenous Once  . amitriptyline  25 mg Oral QHS  . atorvastatin  20 mg Oral Daily  . dutasteride  0.5 mg Oral Daily  . fluconazole  100 mg Oral Daily  . insulin aspart  0-9 Units Subcutaneous TID WC  . insulin aspart  4 Units Subcutaneous TID WC  . insulin glargine  10 Units Subcutaneous Daily  . lanthanum   1,000 mg Oral TID WC  . metoprolol tartrate  25  mg Oral BID  . pantoprazole  40 mg Oral Q0600  . polyethylene glycol  17 g Oral BID  . senna  1 tablet Oral BID  . warfarin  1 mg Oral ONCE-1800  . Warfarin - Pharmacist Dosing Inpatient   Does not apply q1800   Continuous Infusions: . sodium chloride 50 mL/hr at 01/24/16 1021   Eugina Row A, MD [facility] Pager [phone]  If 7PM-7AM, please contact night-coverage [email or web address] Password [email or web address] 01/26/2016, 1:07 PM

## 2016-01-27 LAB — RENAL FUNCTION PANEL
Albumin: 3 g/dL — ABNORMAL LOW (ref 3.5–5.0)
Anion gap: 10 (ref 5–15)
BUN: 73 mg/dL — AB (ref 6–20)
CALCIUM: 8.2 mg/dL — AB (ref 8.9–10.3)
CO2: 21 mmol/L — AB (ref 22–32)
CREATININE: 4.69 mg/dL — AB (ref 0.61–1.24)
Chloride: 106 mmol/L (ref 101–111)
GFR calc Af Amer: 13 mL/min — ABNORMAL LOW (ref >60)
GFR calc non Af Amer: 11 mL/min — ABNORMAL LOW (ref >60)
GLUCOSE: 265 mg/dL — AB (ref 65–99)
Phosphorus: 6.6 mg/dL — ABNORMAL HIGH (ref 2.5–4.6)
Potassium: 4.5 mmol/L (ref 3.5–5.1)
SODIUM: 137 mmol/L (ref 135–145)

## 2016-01-27 LAB — GLUCOSE, CAPILLARY
GLUCOSE-CAPILLARY: 206 mg/dL — AB (ref 65–99)
Glucose-Capillary: 204 mg/dL — ABNORMAL HIGH (ref 65–99)
Glucose-Capillary: 179 mg/dL — ABNORMAL HIGH (ref 65–99)
Glucose-Capillary: 231 mg/dL — ABNORMAL HIGH (ref 65–99)

## 2016-01-27 LAB — PROTIME-INR
INR: 2.52 — AB (ref 0.00–1.49)
PROTHROMBIN TIME: 26.8 s — AB (ref 11.6–15.2)

## 2016-01-27 MED ORDER — WARFARIN SODIUM 1 MG PO TABS
1.0000 mg | ORAL_TABLET | Freq: Once | ORAL | Status: AC
  Administered 2016-01-27: 1 mg via ORAL
  Filled 2016-01-27: qty 1

## 2016-01-27 MED ORDER — INSULIN GLARGINE 100 UNIT/ML ~~LOC~~ SOLN
18.0000 [IU] | Freq: Every day | SUBCUTANEOUS | Status: DC
  Administered 2016-01-28 – 2016-01-31 (×4): 18 [IU] via SUBCUTANEOUS
  Filled 2016-01-27 (×5): qty 0.18

## 2016-01-27 NOTE — Progress Notes (Signed)
Physical Therapy Treatment Patient Details Name: Dorann LodgeMax B Novitsky MRN: [id number] DOB: 03-Aug-1935 Today's Date: 01/27/2016    History of Present Illness pt admitted to the hosptial  with acute renal failure  daughter reports he has been having excessive urination.     PT Comments    Pt feeling better, eating a hamburger.  Assisted OOB to amb then positioned in recliner.    Follow Up Recommendations  Home health PT     Equipment Recommendations  None recommended by PT    Recommendations for Other Services       Precautions / Restrictions Precautions Precautions: Fall Restrictions Weight Bearing Restrictions: No    Mobility  Bed Mobility Overal bed mobility: Needs Assistance             General bed mobility comments: assisted OOB using bed pad  Transfers Overall transfer level: Needs assistance Equipment used: Rolling walker (2 wheeled) Transfers: Sit to/from Stand Sit to Stand: Min assist         General transfer comment: 25% VC's on proper tech.  Pt impulsive.  Ambulation/Gait Ambulation/Gait assistance: Mod assist Ambulation Distance (Feet): 45 Feet Assistive device: Rolling walker (2 wheeled) Gait Pattern/deviations: Step-to pattern;Step-through pattern;Trunk flexed Gait velocity: WFL   General Gait Details: used B platform EVA walker for increased support due to no 2nd assist and pt's unsteady gait.  Tolerated increased distance.     Stairs            Wheelchair Mobility    Modified Rankin (Stroke Patients Only)       Balance                                    Cognition Arousal/Alertness: Awake/alert Behavior During Therapy: WFL for tasks assessed/performed Overall Cognitive Status: Within Functional Limits for tasks assessed                      Exercises      General Comments        Pertinent Vitals/Pain Pain Assessment: No/denies pain    Home Living                      Prior Function             PT Goals (current goals can now be found in the care plan section) Progress towards PT goals: Progressing toward goals    Frequency  Min 3X/week    PT Plan Current plan remains appropriate    Co-evaluation             End of Session Equipment Utilized During Treatment: Gait belt Activity Tolerance: Treatment limited secondary to medical complications (Comment) Patient left: in chair;with chair alarm set;with nursing/sitter in room;with family/visitor present     Time: 1545-1600 PT Time Calculation (min) (ACUTE ONLY): 15 min  Charges:  $Gait Training: 8-22 mins                    G Codes:      Felecia ShellingLori Carlous Olivares  PTA [facility]  Acute  Rehab Pager      [phone]

## 2016-01-27 NOTE — Progress Notes (Signed)
PROGRESS NOTE  NAS WAFER WRU:[id number] DOB: [birth year] DOA: 01/17/2016 PCP: No primary care provider on file. Outpatient Specialists:    LOS: 10 days   Subjective: Good urine output of 3.7 L yesterday. He maintains his potassium homeostasis and non-oliguric. Nephrology please advise if patient needs to stay in the hospital until his creatinine recovers.  Brief Narrative: Transfer from [facility] on 01/17/16. Patient is a 81 year old male with history of pancreatitis, DM, HTN, recurrent UTI, recurrent kidney stones, atrial fibrillation, and colon cancer (right hemicolectomy). Presented to [facility] ED with elevated creatinine, probable UTI, and pneumonia. He has been having urinary frequency, dysuria, confusion, and suprapubic abdominal pain over the past month. He saw PCP 2 days prior to admission and was started on ciprofloxacin for UTI, but on his way home his PCP called to tell him to go to ED for IV fluids and antibiotics. Blood pressure was 97/51, CT showed hydronephrosis and kidney stones, and chest x-ray showed pneumonia. Sent as transferred to [facility].   Assessment & Plan: Principal Problem:   ARF (acute renal failure) (HCC) Active Problems:   CAP (community acquired pneumonia)   Metabolic acidosis   Dehydration   Anemia, chronic disease   Hydronephrosis   Recurrent UTI   Recurrent nephrolithiasis   Chronic a-fib (HCC)   DM (diabetes mellitus) (HCC)   BPH (benign prostatic hyperplasia)   Constipation   HTN (hypertension), benign   Hyperlipidemia   Hydronephrosis of right kidney   Chest pain    Acute renal failure - Questionable etiology. Likely secondary to a post renal azotemia as patient noted to have a right hydronephrosis PET/CT scan from outside hospital versus prerenal azotemia as patient noted to have borderline hypotension in the setting of ACE inhibitor. Foley catheter was placed at outside hospital after diagnosis of UTI. Urinalysis with large  leukocytes nitrite negative to numerous to count WBCs. FENA = 5.5% - Patient s/p cystoscopy with right ureteral stenting 01/20/2016. - Worsening creatinine daily, currently 5.02 from 3.7 on 4/7  - Renal ultrasound with moderate right hydronephrosis, resolved 4/14 - Diflucan discontinued, he has 10 days of it. - Await recommendation from nephrology.  Metabolic acidosis - Resolved.   Chest pain - 4/8/ patient stated chest pain lasted only 30 seconds with both typical and atypical features. - Patient with prior cardiac history. EKG with atrial fibrillation. Cardiac enzymes negative 3. - Resolved   Urinary tract infection/candiduria - Urine cultures with greater than 100,000 yeast.  - Levofloxacin discontinued 4/12. - Diflucan started on 4/13.  Atrial fibrillation - Continue metoprolol for rate control - INR today is 2.29  - Currently on coumadin  Hypertension - Lisinopril on hold secondary to worsening renal function - Nephrology advised to lower metoprolol 4/14  Hyperlipidemia - Continue Lipitor  Right hydronephrosis/probable kidney stones - Per CT from outside hospital.  - Renal ultrasound with moderate right hydronephrosis and chronic UPJ stenosis. No evidence of left hydronephrosis area and left lower pole renal calculi. Patient status post cystoscopy and right retrograde pyelogram with double-J stent placement 01/20/2016 - Resolved per urology   Constipation - Continue MiraLAX twice daily as well as Senokot for bowel regimen.  Community acquired pneumonia - Sputum Gram stain and culture pending.  - Urine strep pneumococcus antigen negative.  - Urine Legionella antigen pending.  - On oral levofloxacin.  Dehydration - Saline lock IV fluids   Chronic anemia - No overt bleeding. - Status post 2 units PRBC on 4/10  -  Hemoglobin currently 9.5 - Follow H&H.   BPH - Status post Foley catheter placement.  - Continue Avodart.  - Patient s/p cystoscopy and right  double-J stent placement 01/20/2016. Per urology.  Diabetes mellitus - Hemoglobin A1c on 4/7 was 9.7.  - CBGs have been consistently high.  - Continue Lantus at 10 units daily started on 4/10. Sliding scale insulin. Titrate as needed.   Code Status: Full Family Communication: Updated patient and daughter at bedside. Disposition Plan: Home when medically stable, and renal function close to baseline and clinical improvement and per urology.   Consultants: Urology Nephrology  Procedures: Renal ultrasound 01/18/2016 Chest x-ray 01/17/2016 Renal US 01/18/2016 and 01/24/2016 2 units PRBCs 01/20/2016  Antibiotics: IV Levaquin 01/17/2016  Objective: Filed Vitals:   01/26/16 2124 01/27/16 0430 01/27/16 1217 01/27/16 1259  BP: 173/61 178/61 179/65 163/71  Pulse: 59 56 63 65  Temp: 97.4 F (36.3 C) 98.3 F (36.8 C)  98.4 F (36.9 C)  TempSrc: Axillary Oral  Oral  Resp: 16 16  16   Height:      Weight:      SpO2: 100% 97%  98%    Intake/Output Summary (Last 24 hours) at 01/27/16 1519 Last data filed at 01/27/16 1303  Gross per 24 hour  Intake    400 ml  Output   3975 ml  Net  -3575 ml   Filed Weights   01/23/16 0510 01/23/16 2050 01/26/16 0439  Weight: 74.435 kg (164 lb 1.6 oz) 74.753 kg (164 lb 12.8 oz) 74.753 kg (164 lb 12.8 oz)    Examination:  Filed Vitals:   01/26/16 2124 01/27/16 0430 01/27/16 1217 01/27/16 1259  BP: 173/61 178/61 179/65 163/71  Pulse: 59 56 63 65  Temp: 97.4 F (36.3 C) 98.3 F (36.8 C)  98.4 F (36.9 C)  TempSrc: Axillary Oral  Oral  Resp: 16 16  16   Height:      Weight:      SpO2: 100% 97%  98%   Constitutional: well nourished, well developed, NAD. ENMT: Mucous membranes are moist.  Respiratory: clear to auscultation bilaterally, no wheezing, no crackles. Normal respiratory effort. No accessory muscle use.  Cardiovascular: Regular rate and rhythm. No extremity edema. Abdomen: Soft, non tender, non distended. Bowel sounds positive.    Musculoskeletal: no clubbing / cyanosis.  Neurologic: non focal  Psychiatric: Slow mentation.  Alert and oriented x 3. Normal mood.    Data Reviewed: I have personally reviewed following labs and imaging studies  CBC:  Recent Labs Lab 01/21/16 0347 01/22/16 0338 01/23/16 0352 01/24/16 0355 01/25/16 0353  WBC 6.0 6.0 6.1 6.2 5.6  NEUTROABS 4.2  --   --   --   --   HGB 10.4* 9.3* 9.9* 9.5* 9.3*  HCT 29.7* 26.6* 29.1* 27.7* 26.7*  MCV 84.6 84.7 87.4 87.4 86.7  PLT 165 157 189 161 171   Basic Metabolic Panel:  Recent Labs Lab 01/23/16 0352 01/24/16 0355 01/25/16 0353 01/26/16 0431 01/27/16 0340  NA 139 138 138 138 137  K 4.8 4.5 4.4 5.1 4.5  CL 99* 101 104 104 106  CO2 27 23 21* 21* 21*  GLUCOSE 300* 160* 155* 198* 265*  BUN 74* 75* 74* 73* 73*  CREATININE 4.89* 5.02* 4.85* 4.96* 4.69*  CALCIUM 8.0* 8.0* 8.0* 8.2* 8.2*  PHOS 6.7* 7.1* 7.1* 7.5* 6.6*   GFR: Estimated Creatinine Clearance: 13.7 mL/min (by C-G formula based on Cr of 4.69). Liver Function Tests:  Recent Labs Lab 01/22/16  [id number] 01/23/16 0352 01/24/16 0355 01/25/16 0353 01/26/16 0431 01/27/16 0340  AST 29  --   --   --   --   --   ALT 36  --   --   --   --   --   ALKPHOS 63  --   --   --   --   --   BILITOT 0.6  --   --   --   --   --   PROT 5.9*  --   --   --   --   --   ALBUMIN 3.0* 3.0* 2.9* 3.0* 2.9* 3.0*   Coagulation Profile:  Recent Labs Lab 01/23/16 0352 01/24/16 0355 01/25/16 0353 01/26/16 0431 01/27/16 0340  INR 2.39* 2.24* 2.29* 2.51* 2.52*   Cardiac Enzymes:  Recent Labs Lab 01/21/16 1419  CKTOTAL 75    Recent Labs Lab 01/26/16 1128 01/26/16 1641 01/26/16 2215 01/27/16 0749 01/27/16 1231  GLUCAP 151* 253* 274* 204* 179*   Urine analysis:    Component Value Date/Time   COLORURINE YELLOW 01/21/2016 1434   APPEARANCEUR CLOUDY* 01/21/2016 1434   LABSPEC 1.013 01/21/2016 1434   PHURINE 7.0 01/21/2016 1434   GLUCOSEU 500* 01/21/2016 1434   HGBUR LARGE*  01/21/2016 1434   BILIRUBINUR NEGATIVE 01/21/2016 1434   KETONESUR NEGATIVE 01/21/2016 1434   PROTEINUR 30* 01/21/2016 1434   UROBILINOGEN 0.2 05/30/2011 1144   NITRITE NEGATIVE 01/21/2016 1434   LEUKOCYTESUR MODERATE* 01/21/2016 1434   Sepsis Labs: Invalid input(s): PROCALCITONIN, LACTICIDVEN  Recent Results (from the past 240 hour(s))  Culture, blood (routine x 2) Call MD if unable to obtain prior to antibiotics being given     Status: None   Collection Time: 01/17/16  4:18 PM  Result Value Ref Range Status   Specimen Description BLOOD RIGHT ARM  Final   Special Requests IN PEDIATRIC BOTTLE 2 CC  Final   Culture   Final    NO GROWTH 5 DAYS [facility]formed [facility]    Report Status 01/22/2016 FINAL  Final  Culture, blood (routine x 2) Call MD if unable to obtain prior to antibiotics being given     Status: None   Collection Time: 01/17/16  4:37 PM  Result Value Ref Range Status   Specimen Description BLOOD RIGHT HAND  Final   Special Requests BOTTLES DRAWN AEROBIC ONLY 5 CC  Final   Culture   Final    NO GROWTH 5 DAYS [facility]formed [facility]    Report Status 01/22/2016 FINAL  Final  Urine culture     Status: Abnormal   Collection Time: 01/17/16  5:14 PM  Result Value Ref Range Status   Specimen Description URINE, RANDOM  Final   Special Requests NONE  Final   Culture >=100,000 COLONIES/mL YEAST (A)  Final   Report Status 01/19/2016 FINAL  Final      Radiology Studies: No results found.   Scheduled Meds: . sodium chloride   Intravenous Once  . sodium chloride   Intravenous Once  . amitriptyline  25 mg Oral QHS  . atorvastatin  20 mg Oral Daily  . dutasteride  0.5 mg Oral Daily  . insulin aspart  0-9 Units Subcutaneous TID WC  . insulin aspart  4 Units Subcutaneous TID WC  . [START ON 01/28/2016] insulin glargine  18 Units Subcutaneous Daily  . lanthanum  1,000 mg Oral TID WC  . metoprolol tartrate  25 mg Oral BID  . pantoprazole  40 mg Oral  Q0600  . polyethylene glycol  17 g Oral BID  . senna  1 tablet Oral BID  . warfarin  1 mg Oral ONCE-1800  . Warfarin - Pharmacist Dosing Inpatient   Does not apply q1800   Continuous Infusions: . sodium chloride 50 mL/hr at 01/27/16 0533   [facility] A, MD [facility] Pager [phone]  If 7PM-7AM, please contact night-coverage [email or web address] Password [facility] 01/27/2016, 3:19 PM

## 2016-01-27 NOTE — Progress Notes (Signed)
ANTICOAGULATION CONSULT NOTE - Follow Up Consult  Pharmacy Consult for Warfarin Indication: atrial fibrillation  Allergies  Allergen Reactions  . Nitroglycerin Anaphylaxis  . Penicillins Anaphylaxis, Hives and Shortness Of Breath    Has patient had a PCN reaction causing immediate rash, facial/tongue/throat swelling, SOB or lightheadedness with hypotension: Yes Has patient had a PCN reaction causing severe rash involving mucus membranes or skin necrosis: Yes Has patient had a PCN reaction that required hospitalization Yes Has patient had a PCN reaction occurring within the last 10 years: No If all of the above answers are "NO", then may proceed with Cephalosporin use.   . Sudafed  [Pseudoephedrine Hcl] Hives  . Tetracycline Shortness Of Breath    Other reaction(s): SHORTNESS OF BREATH  . Vancomycin Hives  . Doxycycline Rash    Other reaction(s): RASH  . Sulfa Antibiotics Rash   Patient Measurements: Height: 6\' 3"  (190.5 cm) Weight: 164 lb 12.8 oz (74.753 kg) IBW/kg (Calculated) : 84.5  Vital Signs: Temp: 98.3 F (36.8 C) (04/17 0430) Temp Source: Oral (04/17 0430) BP: 178/61 mmHg (04/17 0430) Pulse Rate: 56 (04/17 0430)  Labs:  Recent Labs  01/25/16 0353 01/26/16 0431 01/27/16 0340  HGB 9.3*  --   --   HCT 26.7*  --   --   PLT 171  --   --   LABPROT 25.0* 26.8* 26.8*  INR 2.29* 2.51* 2.52*  CREATININE 4.85* 4.96* 4.69*   Estimated Creatinine Clearance: 13.7 mL/min (by C-G formula based on Cr of 4.69).  Assessment: [age] y/oM with PMH of anemia, atrial fibrillation on warfarin, DM, BPH, remote history of colon cancer who is currently admitted for acute renal failure, CAP, and UTI. Pharmacy consulted to assist with dosing warfarin while patient in the hospital.   Home dose is 2.5mg  qday except 5mg  on T/R/Sat  Today, 01/27/2016:  INR therapeutic   CBC: Hgb low, stable, plts WNL  No bleeding reported/documented.  4/10: Underwent cystoscopy, right retrograde  ureteropyelogram, right double-J stent placement. No bleeding noted in procedure note.    Tolerating diet.  Major drug interactions: Fluconazole can increase INR response.  Lovenox d/c'd due to INR > 2  Goal of Therapy:  INR 2-3 Monitor platelets by anticoagulation protocol: Yes   Plan:   Warfarin 1mg  today at 1800  Daily PT/INR  Otho BellowsGreen, Gerianne Simonet L PharmD Pager [phone] 01/27/2016, 8:30 AM

## 2016-01-27 NOTE — Progress Notes (Signed)
Subjective: Interval History: has no complaint , tired.  Objective: Vital signs in last 24 hours: Temp:  [97.4 F (36.3 C)-98.4 F (36.9 C)] 98.4 F (36.9 C) (04/17 1259) Pulse Rate:  [56-65] 65 (04/17 1259) Resp:  [16] 16 (04/17 1259) BP: (163-179)/(61-71) 163/71 mmHg (04/17 1259) SpO2:  [97 %-100 %] 98 % (04/17 1259) Weight change:   Intake/Output from previous day: 04/16 0701 - 04/17 0700 In: 680 [P.O.:600; I.V.:80] Out: 3725 [Urine:3725] Intake/Output this shift: Total I/O In: 400 [P.O.:400] Out: 1350 [Urine:1350]  General appearance: cooperative, no distress, pale and slowed mentation Resp: clear to auscultation bilaterally Cardio: regular rate and rhythm, S1, S2 normal and systolic murmur: holosystolic 2/6, blowing at apex GI: soft, non-tender; bowel sounds normal; no masses,  no organomegaly Extremities: extremities normal, atraumatic, no cyanosis or edema  Lab Results:  Recent Labs  01/25/16 0353  WBC 5.6  HGB 9.3*  HCT 26.7*  PLT 171   BMET:   Recent Labs  01/26/16 0431 01/27/16 0340  NA 138 137  K 5.1 4.5  CL 104 106  CO2 21* 21*  GLUCOSE 198* 265*  BUN 73* 73*  CREATININE 4.96* 4.69*  CALCIUM 8.2* 8.2*   No results for input(s): PTH in the last 72 hours. Iron Studies: No results for input(s): IRON, TIBC, TRANSFERRIN, FERRITIN in the last 72 hours.  Studies/Results: No results found.  I have reviewed the patient's current medications.  Assessment/Plan: 1 AKI good urine vol.  Creatinine down some today. Will be patient as should recover . Mild acidemia 2 Anemia stable 3 r hydro sp stent 4 Yeast UTI sp rx 5 DM controlled 6 Hx renal stones P cont ivf, follow chem     LOS: 10 days   Norah Fick D 01/27/2016,3:50 PM

## 2016-01-27 NOTE — Progress Notes (Signed)
Inpatient Diabetes Program Recommendations  AACE/ADA: New Consensus Statement on Inpatient Glycemic Control (2015)  Target Ranges:  Prepandial:   less than 140 mg/dL      Peak postprandial:   less than 180 mg/dL (1-2 hours)      Critically ill patients:  140 - 180 mg/dL   Review of Glycemic Control  Results for Gerstner, Audria NineMAX B (MRN [id number]) as of 01/27/2016 11:55  Ref. Range 01/26/2016 16:41 01/26/2016 22:15 01/27/2016 07:49  Glucose-Capillary Latest Ref Range: 65-99 mg/dL 409253 (H) 811274 (H) 914204 (H)   Needs insulin adjustment.  Inpatient Diabetes Program Recommendations:    Increase Lantus to 18 units QD. (1/2 home dose)  Will continue to follow. Thank you. Ailene Ardshonda Bernadette Gores, RD, LDN, CDE Inpatient Diabetes Coordinator [phone]

## 2016-01-28 LAB — RENAL FUNCTION PANEL
ALBUMIN: 3 g/dL — AB (ref 3.5–5.0)
Anion gap: 11 (ref 5–15)
BUN: 68 mg/dL — AB (ref 6–20)
CO2: 18 mmol/L — ABNORMAL LOW (ref 22–32)
CREATININE: 4.6 mg/dL — AB (ref 0.61–1.24)
Calcium: 8.2 mg/dL — ABNORMAL LOW (ref 8.9–10.3)
Chloride: 105 mmol/L (ref 101–111)
GFR calc Af Amer: 13 mL/min — ABNORMAL LOW (ref >60)
GFR, EST NON AFRICAN AMERICAN: 11 mL/min — AB (ref >60)
GLUCOSE: 240 mg/dL — AB (ref 65–99)
PHOSPHORUS: 6 mg/dL — AB (ref 2.5–4.6)
Potassium: 4.7 mmol/L (ref 3.5–5.1)
Sodium: 134 mmol/L — ABNORMAL LOW (ref 135–145)

## 2016-01-28 LAB — GLUCOSE, CAPILLARY
GLUCOSE-CAPILLARY: 225 mg/dL — AB (ref 65–99)
GLUCOSE-CAPILLARY: 274 mg/dL — AB (ref 65–99)
GLUCOSE-CAPILLARY: 217 mg/dL — AB (ref 65–99)

## 2016-01-28 LAB — PROTIME-INR
INR: 2.22 — AB (ref 0.00–1.49)
PROTHROMBIN TIME: 24.4 s — AB (ref 11.6–15.2)

## 2016-01-28 MED ORDER — WARFARIN SODIUM 2.5 MG PO TABS: 2.5000 mg | ORAL_TABLET | Freq: Once | ORAL | Status: AC

## 2016-01-28 NOTE — Progress Notes (Signed)
ANTICOAGULATION CONSULT NOTE - Follow Up Consult  Pharmacy Consult for Warfarin Indication: atrial fibrillation  Allergies  Allergen Reactions  . Nitroglycerin Anaphylaxis  . Penicillins Anaphylaxis, Hives and Shortness Of Breath    Has patient had a PCN reaction causing immediate rash, facial/tongue/throat swelling, SOB or lightheadedness with hypotension: Yes Has patient had a PCN reaction causing severe rash involving mucus membranes or skin necrosis: Yes Has patient had a PCN reaction that required hospitalization Yes Has patient had a PCN reaction occurring within the last 10 years: No If all of the above answers are "NO", then may proceed with Cephalosporin use.   . Sudafed  [Pseudoephedrine Hcl] Hives  . Tetracycline Shortness Of Breath    Other reaction(s): SHORTNESS OF BREATH  . Vancomycin Hives  . Doxycycline Rash    Other reaction(s): RASH  . Sulfa Antibiotics Rash   Patient Measurements: Height: 6\' 3"  (190.5 cm) Weight: 175 lb (79.379 kg) IBW/kg (Calculated) : 84.5  Vital Signs: Temp: 98.2 F (36.8 C) (04/18 0544) Temp Source: Oral (04/18 0544) BP: 180/60 mmHg (04/18 0544) Pulse Rate: 63 (04/18 0544)  Labs:  Recent Labs  01/26/16 0431 01/27/16 0340 01/28/16 0344  LABPROT 26.8* 26.8* 24.4*  INR 2.51* 2.52* 2.22*  CREATININE 4.96* 4.69* 4.60*   Estimated Creatinine Clearance: 14.9 mL/min (by C-G formula based on Cr of 4.6).  Assessment: 89 y/oM with PMH of anemia, atrial fibrillation on warfarin, DM, BPH, remote history of colon cancer who is currently admitted for acute renal failure, CAP, and UTI. Pharmacy consulted to assist with dosing warfarin while patient in the hospital.   Home dose is 2.5mg  qday except 5mg  on T/R/Sat  Today, 01/28/2016:  INR therapeutic, 2.22  CBC: Hgb low, stable, plts WNL (4/15)  No bleeding reported/documented.  4/10: Underwent cystoscopy, right retrograde ureteropyelogram, right double-J stent placement. No bleeding  noted in procedure note.    Tolerating diet.  Major drug interactions: Fluconazole can increase INR response., completed 10 days therapy 4/16  Lovenox 4/11-12 - d/c'd due to INR > 2  Goal of Therapy:  INR 2-3 Monitor platelets by anticoagulation protocol: Yes   Plan:   Warfarin 2.5mg  today at 1800  Daily PT/INR  Otho BellowsGreen, Sharece Fleischhacker L PharmD Pager [phone] 01/28/2016, 8:52 AM

## 2016-01-28 NOTE — Progress Notes (Signed)
Occupational Therapy Treatment Patient Details Name: Paul Guerra MRN: [id number] DOB: [birth year] Today's Date: 01/28/2016    History of present illness pt admitted to the hosptial  with acute renal failure  daughter reports he has been having excessive urination.    OT comments  Patient progressing towards OT goals. Continue OT per plan of care.  Follow Up Recommendations  Home health OT;Supervision/Assistance - 24 hour    Equipment Recommendations  None recommended by OT    Recommendations for Other Services      Precautions / Restrictions Precautions Precautions: Fall       Mobility Bed Mobility Overal bed mobility: Needs Assistance Bed Mobility: Supine to Sit     Supine to sit: Supervision;HOB elevated     General bed mobility comments: used rails  Transfers Overall transfer level: Needs assistance Equipment used: Rolling walker (2 wheeled) Transfers: Sit to/from Stand Sit to Stand: Min guard Stand pivot transfers: Min guard            Balance                                   ADL Overall ADL's : Needs assistance/impaired Eating/Feeding: Independent;Sitting   Grooming: Wash/dry hands;Wash/dry face;Set up Grooming Details (indicate cue type and reason): also shaving with electric shaver                             Functional mobility during ADLs: Min guard;Rolling walker General ADL Comments: Patient reports he does not feel like ambulating but willing to get up to recliner. After getting up to recliner, pt participating in grooming activities. Tolerated well.      Vision                     Perception     Praxis      Cognition   Behavior During Therapy: WFL for tasks assessed/performed Overall Cognitive Status: Within Functional Limits for tasks assessed                       Extremity/Trunk Assessment               Exercises     Shoulder Instructions       General Comments       Pertinent Vitals/ Pain       Pain Assessment: No/denies pain  Home Living                                          Prior Functioning/Environment              Frequency Min 2X/week     Progress Toward Goals  OT Goals(current goals can now be found in the care plan section)  Progress towards OT goals: Progressing toward goals     Plan Discharge plan remains appropriate    Co-evaluation                 End of Session Equipment Utilized During Treatment: Rolling walker   Activity Tolerance Patient tolerated treatment well   Patient Left in chair;with call bell/phone within reach;with chair alarm set   Nurse Communication          Time: 1115-1130 OT Time Calculation (min): 15 min  Charges: OT General Charges $OT Visit: 1 Procedure OT Treatments $Self Care/Home Management : 8-22 mins  Paul Guerra A 01/28/2016, 1:45 PM

## 2016-01-28 NOTE — Progress Notes (Signed)
PROGRESS NOTE  Paul Guerra EAV:[id number] DOB: [birth year] DOA: 01/17/2016 PCP: No primary care provider on file. Outpatient Specialists:    LOS: 11 days   Subjective: Wants to go home, but creatinine still elevated at 4.6. Normal potassium, made 4.5 L of liter yesterday, per nephrology increase fluids, check BMP in a.m.  Brief Narrative: Transfer from [facility] on 01/17/16. Patient is a 81 year old male with history of pancreatitis, DM, HTN, recurrent UTI, recurrent kidney stones, atrial fibrillation, and colon cancer (right hemicolectomy). Presented to [facility] ED with elevated creatinine, probable UTI, and pneumonia. He has been having urinary frequency, dysuria, confusion, and suprapubic abdominal pain over the past month. He saw PCP 2 days prior to admission and was started on ciprofloxacin for UTI, but on his way home his PCP called to tell him to go to ED for IV fluids and antibiotics. Blood pressure was 97/51, CT showed hydronephrosis and kidney stones, and chest x-ray showed pneumonia. Acute renal failure not improving very well, thought to be secondary to ATN. Follow nephrology recommendation.  Assessment & Plan: Principal Problem:   ARF (acute renal failure) (HCC) Active Problems:   CAP (community acquired pneumonia)   Metabolic acidosis   Dehydration   Anemia, chronic disease   Hydronephrosis   Recurrent UTI   Recurrent nephrolithiasis   Chronic a-fib (HCC)   DM (diabetes mellitus) (HCC)   BPH (benign prostatic hyperplasia)   Constipation   HTN (hypertension), benign   Hyperlipidemia   Hydronephrosis of right kidney   Chest pain    Acute renal failure - Questionable etiology. Likely secondary to a post renal azotemia as patient noted to have a right hydronephrosis PET/CT scan from outside hospital versus prerenal azotemia as patient noted to have borderline hypotension in the setting of ACE inhibitor. Foley catheter was placed at outside hospital after diagnosis of  UTI. Urinalysis with large leukocytes nitrite negative to numerous to count WBCs. FENA = 5.5% - Patient s/p cystoscopy with right ureteral stenting 01/20/2016. - Worsening creatinine daily, currently 5.02 from 3.7 on 4/7  - Renal ultrasound with moderate right hydronephrosis, resolved 4/14 - Diflucan discontinued, he has 10 days of it. - Increase IV fluids, follow BMP in a.m.  Metabolic acidosis - Resolved.   Chest pain - 4/8/ patient stated chest pain lasted only 30 seconds with both atypical features. - Patient with prior cardiac history. EKG with atrial fibrillation. Cardiac enzymes negative 3. - Resolved   Urinary tract infection/candiduria - Urine cultures with greater than 100,000 yeast.  - Levofloxacin discontinued 4/12. - Diflucan started on 4/13.  Atrial fibrillation - Continue metoprolol for rate control - INR today is 2.29  - Currently on coumadin  Hypertension - Lisinopril on hold secondary to worsening renal function - Nephrology advised to lower metoprolol 4/14  Hyperlipidemia - Continue Lipitor  Right hydronephrosis/probable kidney stones - Per CT from outside hospital.  - Renal ultrasound with moderate right hydronephrosis and chronic UPJ stenosis. No evidence of left hydronephrosis area and left lower pole renal calculi. Patient status post cystoscopy and right retrograde pyelogram with double-J stent placement 01/20/2016 - Resolved per urology   Constipation - Continue MiraLAX twice daily as well as Senokot for bowel regimen.  Community acquired pneumonia - Sputum Gram stain and culture pending.  - Urine strep pneumococcus antigen negative.  - Urine Legionella antigen pending.  - On oral levofloxacin.  Dehydration - Saline lock IV fluids   Chronic anemia - No overt bleeding. - Status post 2  units PRBC on 4/10  - Hemoglobin currently 9.5 - Follow H&H.   BPH - Status post Foley catheter placement.  - Continue Avodart.  - Patient s/p  cystoscopy and right double-J stent placement 01/20/2016. Per urology.  Diabetes mellitus - Hemoglobin A1c on 4/7 was 9.7.  - CBGs have been consistently high.  - Continue Lantus at 10 units daily started on 4/10. Sliding scale insulin. Titrate as needed.   Code Status: Full Family Communication: Updated patient and daughter at bedside. Disposition Plan: Home when medically stable, and renal function close to baseline and clinical improvement and per urology.   Consultants: Urology Nephrology  Procedures: Renal ultrasound 01/18/2016 Chest x-ray 01/17/2016 Renal US 01/18/2016 and 01/24/2016 2 units PRBCs 01/20/2016  Antibiotics: IV Levaquin 01/17/2016  Objective: Filed Vitals:   01/27/16 1259 01/27/16 2050 01/28/16 0544 01/28/16 1415  BP: 163/71 178/61 180/60 150/22  Pulse: 65 61 63 51  Temp: 98.4 F (36.9 C) 97.4 F (36.3 C) 98.2 F (36.8 C) 97.7 F (36.5 C)  TempSrc: Oral Oral Oral Oral  Resp: Height:      Weight:   79.379 kg (175 lb) 78.2 kg (172 lb 6.4 oz)  SpO2: 98% 100% 100% 96%    Intake/Output Summary (Last 24 hours) at 01/28/16 1454 Last data filed at 01/28/16 0939  Gross per 24 hour  Intake   1180 ml  Output   4000 ml  Net  -2820 ml   Filed Weights   01/26/16 0439 01/28/16 0544 01/28/16 1415  Weight: 74.753 kg (164 lb 12.8 oz) 79.379 kg (175 lb) 78.2 kg (172 lb 6.4 oz)    Examination:  Filed Vitals:   01/27/16 1259 01/27/16 2050 01/28/16 0544 01/28/16 1415  BP: 163/71 178/61 180/60 150/22  Pulse: 65 61 63 51  Temp: 98.4 F (36.9 C) 97.4 F (36.3 C) 98.2 F (36.8 C) 97.7 F (36.5 C)  TempSrc: Oral Oral Oral Oral  Resp: Height:      Weight:   79.379 kg (175 lb) 78.2 kg (172 lb 6.4 oz)  SpO2: 98% 100% 100% 96%   Constitutional: well nourished, well developed, NAD. ENMT: Mucous membranes are moist.  Respiratory: clear to auscultation bilaterally, no wheezing, no crackles. Normal respiratory effort. No accessory muscle  use.  Cardiovascular: Regular rate and rhythm. No extremity edema. Abdomen: Soft, non tender, non distended. Bowel sounds positive.  Musculoskeletal: no clubbing / cyanosis.  Neurologic: non focal  Psychiatric: Slow mentation.  Alert and oriented x 3. Normal mood.    Data Reviewed: I have personally reviewed following labs and imaging studies  CBC:  Recent Labs Lab 01/22/16 0338 01/23/16 0352 01/24/16 0355 01/25/16 0353  WBC 6.0 6.1 6.2 5.6  HGB 9.3* 9.9* 9.5* 9.3*  HCT 26.6* 29.1* 27.7* 26.7*  MCV 84.7 87.4 87.4 86.7  PLT 157 189 161 171   Basic Metabolic Panel:  Recent Labs Lab 01/24/16 0355 01/25/16 0353 01/26/16 0431 01/27/16 0340 01/28/16 0344  NA 138 138 138 137 134*  K 4.5 4.4 5.1 4.5 4.7  CL 101 104 104 106 105  CO2 23 21* 21* 21* 18*  GLUCOSE 160* 155* 198* 265* 240*  BUN 75* 74* 73* 73* 68*  CREATININE 5.02* 4.85* 4.96* 4.69* 4.60*  CALCIUM 8.0* 8.0* 8.2* 8.2* 8.2*  PHOS 7.1* 7.1* 7.5* 6.6* 6.0*   GFR: Estimated Creatinine Clearance: 14.6 mL/min (by C-G formula based on Cr of 4.6). Liver Function Tests:  Recent Labs  Lab 01/22/16 0338  01/24/16 0355 01/25/16 0353 01/26/16 0431 01/27/16 0340 01/28/16 0344  AST 29  --   --   --   --   --   --   ALT 36  --   --   --   --   --   --   ALKPHOS 63  --   --   --   --   --   --   BILITOT 0.6  --   --   --   --   --   --   PROT 5.9*  --   --   --   --   --   --   ALBUMIN 3.0*  < > 2.9* 3.0* 2.9* 3.0* 3.0*  < > = values in this interval not displayed. Coagulation Profile:  Recent Labs Lab 01/24/16 0355 01/25/16 0353 01/26/16 0431 01/27/16 0340 01/28/16 0344  INR 2.24* 2.29* 2.51* 2.52* 2.22*   Cardiac Enzymes: No results for input(s): CKTOTAL, CKMB, CKMBINDEX, TROPONINI in the last 168 hours.  Recent Labs Lab 01/27/16 1231 01/27/16 1702 01/27/16 2119 01/28/16 0739 01/28/16 1154  GLUCAP 179* 231* 206* 225* 274*   Urine analysis:    Component Value Date/Time   COLORURINE YELLOW  01/21/2016 1434   APPEARANCEUR CLOUDY* 01/21/2016 1434   LABSPEC 1.013 01/21/2016 1434   PHURINE 7.0 01/21/2016 1434   GLUCOSEU 500* 01/21/2016 1434   HGBUR LARGE* 01/21/2016 1434   BILIRUBINUR NEGATIVE 01/21/2016 1434   KETONESUR NEGATIVE 01/21/2016 1434   PROTEINUR 30* 01/21/2016 1434   UROBILINOGEN 0.2 05/30/2011 1144   NITRITE NEGATIVE 01/21/2016 1434   LEUKOCYTESUR MODERATE* 01/21/2016 1434   Sepsis Labs: Invalid input(s): PROCALCITONIN, LACTICIDVEN  No results found for this or any previous visit (from the past 240 hour(s)).    Radiology Studies: No results found.   Scheduled Meds: . sodium chloride   Intravenous Once  . sodium chloride   Intravenous Once  . amitriptyline  25 mg Oral QHS  . atorvastatin  20 mg Oral Daily  . dutasteride  0.5 mg Oral Daily  . insulin aspart  0-9 Units Subcutaneous TID WC  . insulin aspart  4 Units Subcutaneous TID WC  . insulin glargine  18 Units Subcutaneous Daily  . lanthanum  1,000 mg Oral TID WC  . metoprolol tartrate  25 mg Oral BID  . pantoprazole  40 mg Oral Q0600  . polyethylene glycol  17 g Oral BID  . senna  1 tablet Oral BID  . warfarin  2.5 mg Oral ONCE-1800  . Warfarin - Pharmacist Dosing Inpatient   Does not apply q1800   Continuous Infusions: . sodium chloride 1,000 mL (01/27/16 2212)   Clydia LlanoELMAHI,Lynia Landry A, MD [facility] Pager [phone]  If 7PM-7AM, please contact night-coverage [email or web address] Password [email or web address] 01/28/2016, 2:54 PM

## 2016-01-28 NOTE — Care Management Important Message (Signed)
Important Message  Patient Details  Name: Paul Guerra B Joubert MRN: [id number] Date of Birth: 04-Apr-1935   Medicare Important Message Given:  Yes    Haskell FlirtJamison, Gordie Crumby 01/28/2016, 9:03 AMImportant Message  Patient Details  Name: Paul Guerra B Davidson MRN: [id number] Date of Birth: 04-Apr-1935   Medicare Important Message Given:  Yes    Haskell FlirtJamison, Justyce Yeater 01/28/2016, 9:03 AM

## 2016-01-28 NOTE — Progress Notes (Signed)
Subjective: Interval History: has no complaint , tired.  Objective: Vital signs in last 24 hours: Temp:  [97.4 F (36.3 C)-98.2 F (36.8 C)] 98.2 F (36.8 C) (04/18 0544) Pulse Rate:  [61-63] 63 (04/18 0544) Resp:  [16] 16 (04/18 0544) BP: (178-180)/(60-61) 180/60 mmHg (04/18 0544) SpO2:  [100 %] 100 % (04/18 0544) Weight:  [79.379 kg (175 lb)] 79.379 kg (175 lb) (04/18 0544) Weight change:   Intake/Output from previous day: 04/17 0701 - 04/18 0700 In: 1340 [P.O.:640; I.V.:700] Out: 4500 [Urine:4500] Intake/Output this shift: Total I/O In: 240 [P.O.:240] Out: 850 [Urine:850]  General appearance: cooperative, no distress, pale and slowed mentation Dry mouth and lips Resp: clear to auscultation bilaterally Cardio: regular rate and rhythm, S1, S2 normal and systolic murmur: holosystolic 2/6, blowing at apex GI: soft, non-tender; bowel sounds normal; no masses,  no organomegaly Extremities: extremities normal, atraumatic, no cyanosis or edema  Lab Results: No results for input(s): WBC, HGB, HCT, PLT in the last 72 hours. BMET:   Recent Labs  01/27/16 0340 01/28/16 0344  NA 137 134*  K 4.5 4.7  CL 106 105  CO2 21* 18*  GLUCOSE 265* 240*  BUN 73* 68*  CREATININE 4.69* 4.60*  CALCIUM 8.2* 8.2*   No results for input(s): PTH in the last 72 hours. Iron Studies: No results for input(s): IRON, TIBC, TRANSFERRIN, FERRITIN in the last 72 hours.  Studies/Results: No results found.  I have reviewed the patient's current medications.  Assessment/Plan: 1 AKI high urine vol poss osmotic diuresis, looks dry 2 Anemia stable 3 r hydro sp stent 4 Yeast UTI sp rx 5 DM controlled 6 Hx renal stones  P replace all losses + 75 cc/hr, f/u creat am    LOS: 11 days   Syrah Daughtrey D 01/28/2016,2:20 PM

## 2016-01-28 NOTE — Progress Notes (Signed)
Nursing Note: On round, pt informed this nurse that he had wet the bed.Pt has foley cath.A: Checked balloon and was not fully inflated.Fully inflated it and cleaned pt and complete bed change performed and foley care provided.Pt tolerated well.wbb

## 2016-01-29 DIAGNOSIS — D638 Anemia in other chronic diseases classified elsewhere: Secondary | ICD-10-CM

## 2016-01-29 DIAGNOSIS — J189 Pneumonia, unspecified organism: Secondary | ICD-10-CM

## 2016-01-29 LAB — GLUCOSE, CAPILLARY
GLUCOSE-CAPILLARY: 79 mg/dL (ref 65–99)
GLUCOSE-CAPILLARY: 264 mg/dL — AB (ref 65–99)
Glucose-Capillary: 67 mg/dL (ref 65–99)
Glucose-Capillary: 198 mg/dL — ABNORMAL HIGH (ref 65–99)
Glucose-Capillary: 203 mg/dL — ABNORMAL HIGH (ref 65–99)
Glucose-Capillary: 310 mg/dL — ABNORMAL HIGH (ref 65–99)
Glucose-Capillary: 164 mg/dL — ABNORMAL HIGH (ref 65–99)

## 2016-01-29 LAB — RENAL FUNCTION PANEL
Albumin: 3 g/dL — ABNORMAL LOW (ref 3.5–5.0)
Anion gap: 11 (ref 5–15)
BUN: 68 mg/dL — AB (ref 6–20)
CHLORIDE: 105 mmol/L (ref 101–111)
CO2: 20 mmol/L — AB (ref 22–32)
Calcium: 8.5 mg/dL — ABNORMAL LOW (ref 8.9–10.3)
Creatinine, Ser: 4.48 mg/dL — ABNORMAL HIGH (ref 0.61–1.24)
GFR calc Af Amer: 13 mL/min — ABNORMAL LOW (ref >60)
GFR, EST NON AFRICAN AMERICAN: 11 mL/min — AB (ref >60)
Glucose, Bld: 318 mg/dL — ABNORMAL HIGH (ref 65–99)
POTASSIUM: 5 mmol/L (ref 3.5–5.1)
Phosphorus: 5.4 mg/dL — ABNORMAL HIGH (ref 2.5–4.6)
Sodium: 136 mmol/L (ref 135–145)

## 2016-01-29 LAB — CBC
HEMATOCRIT: 28.1 % — AB (ref 39.0–52.0)
Hemoglobin: 9.8 g/dL — ABNORMAL LOW (ref 13.0–17.0)
MCH: 30.4 pg (ref 26.0–34.0)
MCHC: 34.9 g/dL (ref 30.0–36.0)
MCV: 87.3 fL (ref 78.0–100.0)
Platelets: 200 10*3/uL (ref 150–400)
RBC: 3.22 MIL/uL — AB (ref 4.22–5.81)
RDW: 13.2 % (ref 11.5–15.5)
WBC: 5.8 10*3/uL (ref 4.0–10.5)

## 2016-01-29 LAB — PROTIME-INR
INR: 1.85 — AB (ref 0.00–1.49)
PROTHROMBIN TIME: 20.6 s — AB (ref 11.6–15.2)

## 2016-01-29 MED ORDER — SODIUM CHLORIDE 0.45 % IV SOLN
INTRAVENOUS | Status: DC
  Administered 2016-01-29: 1512 mL via INTRAVENOUS
  Administered 2016-01-29: 23:00:00 via INTRAVENOUS

## 2016-01-29 MED ORDER — SODIUM CHLORIDE 0.45 % IV SOLN
Freq: Once | INTRAVENOUS | Status: DC
  Administered 2016-01-29: 221 mL via INTRAVENOUS

## 2016-01-29 MED ORDER — WARFARIN SODIUM 2.5 MG PO TABS
2.5000 mg | ORAL_TABLET | ORAL | Status: AC
  Administered 2016-01-29: 2.5 mg via ORAL
  Filled 2016-01-29: qty 1

## 2016-01-29 MED ORDER — SODIUM CHLORIDE 0.45 % IV SOLN
INTRAVENOUS | Status: DC
  Administered 2016-01-29: 400 mL via INTRAVENOUS
  Administered 2016-01-29: 1326 mL via INTRAVENOUS

## 2016-01-29 MED ORDER — SODIUM CHLORIDE 0.45 % IV SOLN: INTRAVENOUS | Status: AC

## 2016-01-29 NOTE — Progress Notes (Deleted)
Patient's total urine output was 1325 ml,replaced amount over 6 hours,started 1/2 NS at a rate of 37cc/hr x 6 hours = 221 cc. Will continue to monitor.

## 2016-01-29 NOTE — Progress Notes (Signed)
PT Cancellation Note  Patient Details Name: Paul Guerra MRN: [id number] DOB: 1934-12-13   Cancelled Treatment:     cancel am nursing care                                            Cancel pm sleeping was OOB earlier   Armando ReichertKropski, Zanna Hawn Ann 01/29/2016, 4:01 PM

## 2016-01-29 NOTE — Progress Notes (Signed)
Pt urine output 300 cc according to md order pt now receiving 0.45 NaCl @ 50 mls/hr for 6 hrs

## 2016-01-29 NOTE — Progress Notes (Signed)
ANTICOAGULATION CONSULT NOTE - Follow Up Consult  Pharmacy Consult for Warfarin Indication: atrial fibrillation  Allergies  Allergen Reactions  . Nitroglycerin Anaphylaxis  . Penicillins Anaphylaxis, Hives and Shortness Of Breath    Has patient had a PCN reaction causing immediate rash, facial/tongue/throat swelling, SOB or lightheadedness with hypotension: Yes Has patient had a PCN reaction causing severe rash involving mucus membranes or skin necrosis: Yes Has patient had a PCN reaction that required hospitalization Yes Has patient had a PCN reaction occurring within the last 10 years: No If all of the above answers are "NO", then may proceed with Cephalosporin use.   . Sudafed  [Pseudoephedrine Hcl] Hives  . Tetracycline Shortness Of Breath    Other reaction(s): SHORTNESS OF BREATH  . Vancomycin Hives  . Doxycycline Rash    Other reaction(s): RASH  . Sulfa Antibiotics Rash    Patient Measurements: Height: 6\' 3"  (190.5 cm) Weight: 172 lb 6.4 oz (78.2 kg) IBW/kg (Calculated) : 84.5  Vital Signs: Temp: 97.8 F (36.6 C) (04/19 0605) Temp Source: Oral (04/19 0605) BP: 167/62 mmHg (04/19 0931) Pulse Rate: 60 (04/19 0931)  Labs:  Recent Labs  01/27/16 0340 01/28/16 0344 01/29/16 0339  HGB  --   --  9.8*  HCT  --   --  28.1*  PLT  --   --  200  LABPROT 26.8* 24.4* 20.6*  INR 2.52* 2.22* 1.85*  CREATININE 4.69* 4.60* 4.48*    Estimated Creatinine Clearance: 15 mL/min (by C-G formula based on Cr of 4.48).  Assessment: 81yo M with PMH of anemia, atrial fibrillation (on warfarin), DM, BPH, and colon cancer who is currently admitted for acute renal failure, CAP, and UTI. Pharmacy consulted to assist with dosing warfarin while patient is in the hospital.  Home dose is 5mg  T/R/Sat and 2.5mg  all other days  01/29/2016 - INR 1.85, subtherapeutic; dose ordered but not charted on 4/18? - CBC: Hgb, Hct low but stable, plts WNL (4/19) - No bleeding reported or documented -  Procedure on 4/10: Cystoscopy, right retrograde ureteropyelogram, right double-J stent placement. No bleeding described in procedural note. - Tolerating diet  Goal of Therapy:  INR 2-3 Monitor platelets by anticoagulation protocol: Yes   Plan:  - Warfarin 2.5mg  now - Daily PT/INR  Alphonzo Severanceyan Ragan 01/29/2016,12:11 PM  Agree with above  Haynes Hoehnolleen Breahna Boylen, PharmD, BCPS 01/29/2016, 1:12 PM  Pager: [phone]

## 2016-01-29 NOTE — Progress Notes (Signed)
Ongoing replacement with IVF 1/2 NS at 221cc/hr to run fro 6 hours per MD order. Will continue to monitor.

## 2016-01-29 NOTE — Progress Notes (Signed)
PROGRESS NOTE  Paul Guerra ZOX:[id number] DOB: 02-13-1935 DOA: 01/17/2016 PCP: No primary care provider on file. Outpatient Specialists:    LOS: 12 days   Subjective: Feels ok, urinating a lot, breathing ok, no issues overnight  Brief Narrative: [facility]. Patient is a 81 year old male with history of pancreatitis, DM, HTN, recurrent UTI, recurrent kidney stones, atrial fibrillation, and colon cancer (right hemicolectomy). [facility] with elevated creatinine, probable UTI, and pneumonia. He has been having urinary frequency, dysuria, confusion, and suprapubic abdominal pain over the past month. He saw PCP 2 days prior to admission and was started on ciprofloxacin for UTI, but on his way home his PCP called to tell him to go to ED for IV fluids and antibiotics. Blood pressure was 97/51, CT showed hydronephrosis and kidney stones, and chest x-ray showed pneumonia. Acute renal failure not improving very well, thought to be secondary to ATN.  Renal following  Assessment & Plan:  Acute renal failure - ATN and hydronephrosis related - creatinine stabilized but no improvement so far, on admission was 3.7, peaked at 5.2 - in state of Osmotic diuresis, urine output 3800 yesterday and 4500 4/17, increase IVF to 125cc/hr - Renal following - Patient s/p cystoscopy with right ureteral stenting 01/20/2016, Hydronephrosis resolved based on last US  Metabolic acidosis - Resolved.   Chest pain - 01/18/16 patient stated chest pain lasted only 30 seconds with both atypical features. - Patient with prior cardiac history. EKG with atrial fibrillation. Cardiac enzymes negative 3. - Resolved   Urinary tract infection/candiduria - Urine cultures with greater than 100,000 yeast.  - Levofloxacin discontinued 4/12. - Diflucan started on 4/13-continue same  Atrial fibrillation - rate controlled on metoprolol  - INR therapeutic, continue coumadin  Hypertension -  Lisinopril stopped secondary to worsening renal function -BP trending higher  Hyperlipidemia - Continue Lipitor  Right hydronephrosis/probable kidney stones - Per CT from outside hospital.  - Renal ultrasound with moderate right hydronephrosis and chronic UPJ stenosis. No evidence of left hydronephrosis area and left lower pole renal calculi. Patient status post cystoscopy and right retrograde pyelogram with double-J stent placement 01/20/2016 - Resolved per urology   Constipation - Continue MiraLAX twice daily as well as Senokot for bowel regimen.  Community acquired pneumonia - Sputum Gram stain and culture pending.  - Urine strep pneumococcus antigen negative.  - Rx with levofloxacin, off Abx now, will need repeat CXR in 4 weeks   Dehydration - Saline lock IV fluids   Chronic anemia - No overt bleeding. - Status post 2 units PRBC on 4/10  - hb improved and stable since  BPH - Status post Foley catheter placement.  - Continue Avodart.   Diabetes mellitus - Hemoglobin A1c on 4/7 was 9.7.  - CBGs have been consistently high.  - Continue Lantus and SSI, will likely need uptitration tomorrow  Code Status: Full Family Communication: none at bedside Disposition Plan: Home when renal function improves   Consultants: Urology Nephrology  Procedures: Renal ultrasound 01/18/2016 Chest x-ray 01/17/2016 Renal US 01/18/2016 and 01/24/2016 2 units PRBCs 01/20/2016  Antibiotics: IV Levaquin 01/17/2016  Objective: Filed Vitals:   01/28/16 1415 01/28/16 2138 01/29/16 0605 01/29/16 0931  BP: 150/22 184/62 179/65 167/62  Pulse: 51 64 54 60  Temp: 97.7 F (36.5 C) 98 F (36.7 C) 97.8 F (36.6 C)   TempSrc: Oral Oral Oral   Resp: 16 16 16    Height:  Weight: 78.2 kg (172 lb 6.4 oz)     SpO2: 96% 98% 95%     Intake/Output Summary (Last 24 hours) at 01/29/16 1118 Last data filed at 01/29/16 0937  Gross per 24 hour  Intake    600 ml  Output   3350 ml  Net  -2750 ml    Filed Weights   01/26/16 0439 01/28/16 0544 01/28/16 1415  Weight: 74.753 kg (164 lb 12.8 oz) 79.379 kg (175 lb) 78.2 kg (172 lb 6.4 oz)    Examination:  Filed Vitals:   01/28/16 1415 01/28/16 2138 01/29/16 0605 01/29/16 0931  BP: 150/22 184/62 179/65 167/62  Pulse: 51 64 54 60  Temp: 97.7 F (36.5 C) 98 F (36.7 C) 97.8 F (36.6 C)   TempSrc: Oral Oral Oral   Resp: Height:      Weight: 78.2 kg (172 lb 6.4 oz)     SpO2: 96% 98% 95%    Constitutional: well nourished, well developed, Alert, awake, Oriented x2  Respiratory: clear to auscultation bilaterally, no wheezing, no crackles. Normal respiratory effort. No accessory muscle use.  Cardiovascular: Regular rate and rhythm. No extremity edema. Abdomen: Soft, non tender, non distended. Bowel sounds positive.  Musculoskeletal: no clubbing / cyanosis.  Neurologic: non focal     Data Reviewed: I have personally reviewed following labs and imaging studies  CBC:  Recent Labs Lab 01/23/16 0352 01/24/16 0355 01/25/16 0353 01/29/16 0339  WBC 6.1 6.2 5.6 5.8  HGB 9.9* 9.5* 9.3* 9.8*  HCT 29.1* 27.7* 26.7* 28.1*  MCV 87.4 87.4 86.7 87.3  PLT 189 161 171 200   Basic Metabolic Panel:  Recent Labs Lab 01/25/16 0353 01/26/16 0431 01/27/16 0340 01/28/16 0344 01/29/16 0339  NA 138 138 137 134* 136  K 4.4 5.1 4.5 4.7 5.0  CL 104 104 106 105 105  CO2 21* 21* 21* 18* 20*  GLUCOSE 155* 198* 265* 240* 318*  BUN 74* 73* 73* 68* 68*  CREATININE 4.85* 4.96* 4.69* 4.60* 4.48*  CALCIUM 8.0* 8.2* 8.2* 8.2* 8.5*  PHOS 7.1* 7.5* 6.6* 6.0* 5.4*   GFR: Estimated Creatinine Clearance: 15 mL/min (by C-G formula based on Cr of 4.48). Liver Function Tests:  Recent Labs Lab 01/25/16 0353 01/26/16 0431 01/27/16 0340 01/28/16 0344 01/29/16 0339  ALBUMIN 3.0* 2.9* 3.0* 3.0* 3.0*   Coagulation Profile:  Recent Labs Lab 01/25/16 0353 01/26/16 0431 01/27/16 0340 01/28/16 0344 01/29/16 0339  INR 2.29* 2.51*  2.52* 2.22* 1.85*   Cardiac Enzymes: No results for input(s): CKTOTAL, CKMB, CKMBINDEX, TROPONINI in the last 168 hours.  Recent Labs Lab 01/28/16 1703 01/28/16 1737 01/28/16 1907 01/28/16 2136 01/29/16 0803  GLUCAP 67 79 217* 264* 198*   Urine analysis:    Component Value Date/Time   COLORURINE YELLOW 01/21/2016 1434   APPEARANCEUR CLOUDY* 01/21/2016 1434   LABSPEC 1.013 01/21/2016 1434   PHURINE 7.0 01/21/2016 1434   GLUCOSEU 500* 01/21/2016 1434   HGBUR LARGE* 01/21/2016 1434   BILIRUBINUR NEGATIVE 01/21/2016 1434   KETONESUR NEGATIVE 01/21/2016 1434   PROTEINUR 30* 01/21/2016 1434   UROBILINOGEN 0.2 05/30/2011 1144   NITRITE NEGATIVE 01/21/2016 1434   LEUKOCYTESUR MODERATE* 01/21/2016 1434   Sepsis Labs: Invalid input(s): PROCALCITONIN, LACTICIDVEN  No results found for this or any previous visit (from the past 240 hour(s)).    Radiology Studies: No results found.   Scheduled Meds: . sodium chloride   Intravenous Once  . sodium chloride   Intravenous Once  .  amitriptyline  25 mg Oral QHS  . atorvastatin  20 mg Oral Daily  . dutasteride  0.5 mg Oral Daily  . insulin aspart  0-9 Units Subcutaneous TID WC  . insulin aspart  4 Units Subcutaneous TID WC  . insulin glargine  18 Units Subcutaneous Daily  . lanthanum  1,000 mg Oral TID WC  . metoprolol tartrate  25 mg Oral BID  . pantoprazole  40 mg Oral Q0600  . polyethylene glycol  17 g Oral BID  . senna  1 tablet Oral BID  . warfarin  2.5 mg Oral ONCE-1800  . Warfarin - Pharmacist Dosing Inpatient   Does not apply q1800   Continuous Infusions: . sodium chloride 125 mL/hr at 01/29/16 0835   Zannie Cove MD [facility] Pager [phone]  If 7PM-7AM, please contact night-coverage [email or web address] Password [email or web address] 01/29/2016, 11:18 AM

## 2016-01-29 NOTE — Progress Notes (Signed)
Subjective: Interval History: has no complaint , tired.  Objective: Vital signs in last 24 hours: Temp:  [97.7 F (36.5 C)-98 F (36.7 C)] 97.8 F (36.6 C) (04/19 0605) Pulse Rate:  [51-64] 60 (04/19 0931) Resp:  [16] 16 (04/19 0605) BP: (150-184)/(22-65) 167/62 mmHg (04/19 0931) SpO2:  [95 %-98 %] 95 % (04/19 0605) Weight:  [78.2 kg (172 lb 6.4 oz)] 78.2 kg (172 lb 6.4 oz) (04/18 1415) Weight change: -1.179 kg (-2 lb 9.6 oz)  Intake/Output from previous day: 04/18 0701 - 04/19 0700 In: 480 [P.O.:480] Out: 3800 [Urine:3800] Intake/Output this shift: Total I/O In: 360 [P.O.:360] Out: 400 [Urine:400]  General appearance: cooperative, no distress, pale and slowed mentation Dry mouth and lips Resp: clear to auscultation bilaterally Cardio: regular rate and rhythm, S1, S2 normal and systolic murmur: holosystolic 2/6, blowing at apex GI: soft, non-tender; bowel sounds normal; no masses,  no organomegaly Extremities: extremities normal, atraumatic, no cyanosis or edema  Lab Results:  Recent Labs  01/29/16 0339  WBC 5.8  HGB 9.8*  HCT 28.1*  PLT 200   BMET:   Recent Labs  01/28/16 0344 01/29/16 0339  NA 134* 136  K 4.7 5.0  CL 105 105  CO2 18* 20*  GLUCOSE 240* 318*  BUN 68* 68*  CREATININE 4.60* 4.48*  CALCIUM 8.2* 8.5*   No results for input(s): PTH in the last 72 hours. Iron Studies: No results for input(s): IRON, TIBC, TRANSFERRIN, FERRITIN in the last 72 hours.  Studies/Results: No results found.  I have reviewed the patient's current medications.  Assessment/Plan: 1 AKI - last cr 1.1 form 7/16.  Presented cr 3.7, hypotension/ acei/ R hydro now stented, poss PNA.  Now creat leveled off but not improving much.  High UOP's >2-3 L / day and he looks dry, this would osmotic diuresis. Will try again to replace all losses + 125 /hr 2 Anemia stable 3 R hydro sp stent 4 Yeast UTI sp rx 5 DM controlled 6 Hx renal stones  P replace all losses every 6hr w  1/2 NS, give additional 125 NS/ hr, hopefully will respond to ^'d volume   LOS: 12 days   Paul Guerra D 01/29/2016,10:15 AM

## 2016-01-29 NOTE — Progress Notes (Signed)
Inpatient Diabetes Program Recommendations  AACE/ADA: New Consensus Statement on Inpatient Glycemic Control (2015)  Target Ranges:  Prepandial:   less than 140 mg/dL      Peak postprandial:   less than 180 mg/dL (1-2 hours)      Critically ill patients:  140 - 180 mg/dL   Review of Glycemic Control  Results for Defina, Audria NineMAX B (MRN [id number]) as of 01/29/2016 11:55  Ref. Range 01/28/2016 07:39 01/28/2016 11:54 01/28/2016 17:03 01/28/2016 17:37 01/28/2016 19:07 01/28/2016 21:36 01/29/2016 08:03  Glucose-Capillary Latest Ref Range: 65-99 mg/dL 409225 (H) 811274 (H) 67 79 914217 (H) 264 (H) 198 (H)   Hypoglycemia d/t no po intake at lunch and still received meal coverage insulin.  Inpatient Diabetes Program Recommendations:    Increase Novolog to 6 units tidwc if pt eats > 50% meal.  Will continue to follow. Thank you. Ailene Ardshonda Janya Eveland, RD, LDN, CDE Inpatient Diabetes Coordinator [phone]

## 2016-01-29 NOTE — Progress Notes (Signed)
Urine output for the last 6 hours was 1300 cc.12/ NS at 216cc/hr started and it will infuse over 6 hours per MD order. Will endorse to night nurse.

## 2016-01-29 NOTE — Progress Notes (Signed)
Nutrition Brief Note  Patient identified as having a LOS of 12 days. Patient is consuming 100% of meals. Weight is stable.  Wt Readings from Last 15 Encounters:  01/28/16 172 lb 6.4 oz (78.2 kg)    Body mass index is 21.55 kg/(m^2). Patient meets criteria for normal range based on current BMI.   Current diet order is Regular, patient is consuming approximately 100% of meals at this time. Labs and medications reviewed.   No nutrition interventions warranted at this time. If nutrition issues arise, please consult RD.   Paul FrancoLindsey Olanrewaju Osborn, MS, RD, LDN Pager: [phone] After Hours Pager: [phone]

## 2016-01-30 LAB — CBC
HCT: 23.9 % — ABNORMAL LOW (ref 39.0–52.0)
Hemoglobin: 8.3 g/dL — ABNORMAL LOW (ref 13.0–17.0)
MCH: 29.5 pg (ref 26.0–34.0)
MCHC: 34.7 g/dL (ref 30.0–36.0)
MCV: 85.1 fL (ref 78.0–100.0)
PLATELETS: 161 10*3/uL (ref 150–400)
RBC: 2.81 MIL/uL — AB (ref 4.22–5.81)
RDW: 13.3 % (ref 11.5–15.5)
WBC: 4.6 10*3/uL (ref 4.0–10.5)

## 2016-01-30 LAB — RENAL FUNCTION PANEL
Albumin: 2.7 g/dL — ABNORMAL LOW (ref 3.5–5.0)
Anion gap: 6 (ref 5–15)
BUN: 57 mg/dL — ABNORMAL HIGH (ref 6–20)
CHLORIDE: 113 mmol/L — AB (ref 101–111)
CO2: 17 mmol/L — ABNORMAL LOW (ref 22–32)
CREATININE: 3.97 mg/dL — AB (ref 0.61–1.24)
Calcium: 8.7 mg/dL — ABNORMAL LOW (ref 8.9–10.3)
GFR, EST AFRICAN AMERICAN: 15 mL/min — AB (ref >60)
GFR, EST NON AFRICAN AMERICAN: 13 mL/min — AB (ref >60)
Glucose, Bld: 222 mg/dL — ABNORMAL HIGH (ref 65–99)
POTASSIUM: 4.7 mmol/L (ref 3.5–5.1)
Phosphorus: 5.1 mg/dL — ABNORMAL HIGH (ref 2.5–4.6)
Sodium: 136 mmol/L (ref 135–145)

## 2016-01-30 LAB — GLUCOSE, CAPILLARY
GLUCOSE-CAPILLARY: 152 mg/dL — AB (ref 65–99)
GLUCOSE-CAPILLARY: 134 mg/dL — AB (ref 65–99)
Glucose-Capillary: 135 mg/dL — ABNORMAL HIGH (ref 65–99)
Glucose-Capillary: 121 mg/dL — ABNORMAL HIGH (ref 65–99)

## 2016-01-30 LAB — PROTIME-INR
INR: 1.66 — ABNORMAL HIGH (ref 0.00–1.49)
Prothrombin Time: 19.1 seconds — ABNORMAL HIGH (ref 11.6–15.2)

## 2016-01-30 MED ORDER — SODIUM CHLORIDE 0.45 % IV SOLN: INTRAVENOUS | Status: AC

## 2016-01-30 MED ORDER — WARFARIN SODIUM 4 MG PO TABS
4.0000 mg | ORAL_TABLET | Freq: Once | ORAL | Status: AC
  Administered 2016-01-30: 4 mg via ORAL
  Filled 2016-01-30: qty 1

## 2016-01-30 MED ORDER — NICOTINE 14 MG/24HR TD PT24
14.0000 mg | MEDICATED_PATCH | Freq: Every day | TRANSDERMAL | Status: DC
  Administered 2016-01-31 – 2016-02-05 (×7): 14 mg via TRANSDERMAL
  Filled 2016-01-30 (×7): qty 1

## 2016-01-30 NOTE — Progress Notes (Signed)
Pt had 700cc of urine output from foley since midnight/time of last IVF rate change. Rate of 0.45% NS now changed to 117cc/hr to run until noon. Mick SellShannon Rihan Schueler RN

## 2016-01-30 NOTE — Progress Notes (Signed)
PROGRESS NOTE  LAYLA GRAMM ZOX:[id number] DOB: 24-Jun-1935 DOA: 01/17/2016 PCP: No primary care provider on file. Outpatient Specialists:    LOS: 13 days   Subjective: No complaints, anxious to go home  Brief Narrative: Transfer from [facility] on 01/17/16. Patient is a 81 year old male with history of pancreatitis, DM, HTN, recurrent UTI, recurrent kidney stones, atrial fibrillation, and colon cancer (right hemicolectomy). Presented to [facility] ED with elevated creatinine, probable UTI, and pneumonia. He has been having urinary frequency, dysuria, confusion, and suprapubic abdominal pain over the past month. He saw PCP 2 days prior to admission and was started on ciprofloxacin for UTI, but on his way home his PCP called to tell him to go to ED for IV fluids and antibiotics. Blood pressure was 97/51, CT showed hydronephrosis and kidney stones, and chest x-ray showed pneumonia. Acute renal failure not improving very well, thought to be secondary to ATN.  Renal following  Assessment & Plan:  Acute renal failure - ATN and hydronephrosis related - creatinine stabilized but no improvement so far, on admission was 3.7, peaked at 5.2 - in state of Osmotic diuresis, urine output 3775 yesterday and 3800 on 4/18,  -on NS at 125cc/hr and getting 1:1 replacement of losses - Renal following, creatinine starting to improve - Patient s/p cystoscopy with right ureteral stenting 01/20/2016, Hydronephrosis resolved based on last [place]  Metabolic acidosis - Resolved.   Chest pain - 01/18/16 patient stated chest pain lasted only 30 seconds with both atypical features. - Patient with prior cardiac history. EKG with atrial fibrillation. Cardiac enzymes negative 3. - Resolved   Urinary tract infection/candiduria - Urine cultures with greater than 100,000 yeast.  - Levofloxacin discontinued 4/12. - Diflucan started on 4/13-continue same  Atrial fibrillation - rate controlled on metoprolol  - INR  subtherapeutic, continue coumadin  Hypertension - Lisinopril stopped secondary to worsening renal function -BP trending higher  Hyperlipidemia - Continue Lipitor  Right hydronephrosis/probable kidney stones - Per CT from outside hospital.  - Renal ultrasound with moderate right hydronephrosis and chronic UPJ stenosis. No evidence of left hydronephrosis area and left lower pole renal calculi. Patient status post cystoscopy and right retrograde pyelogram with double-J stent placement 01/20/2016 - Resolved per urology   Constipation - Continue MiraLAX twice daily as well as Senokot for bowel regimen.  Community acquired pneumonia - Sputum Gram stain and culture pending.  - Urine strep pneumococcus antigen negative.  - Rx with levofloxacin, off Abx now, will need repeat CXR in 4 weeks   Dehydration - Saline lock IV fluids   Chronic anemia - No overt bleeding. - Status post 2 units PRBC on 4/10  - hb improved and stable since  BPH - Status post Foley catheter placement.  - Continue Avodart.   Diabetes mellitus - Hemoglobin A1c on 4/7 was 9.7.  - CBGs stable now  - Continue Lantus and SSI,   Code Status: Full Family Communication: none at bedside Disposition Plan: Home when renal function improves, ? 2-3days   Consultants: Urology Nephrology  Procedures: Renal ultrasound 01/18/2016 Chest x-ray 01/17/2016 Renal US 01/18/2016 and 01/24/2016 2 units PRBCs 01/20/2016  Antibiotics: IV Levaquin 01/17/2016  Objective: Filed Vitals:   01/29/16 0931 01/29/16 1446 01/29/16 2034 01/30/16 0513  BP: 167/62 178/85 173/56 175/88  Pulse: 60 52 50 49  Temp:  97.5 F (36.4 C) 97.4 F (36.3 C) 97.2 F (36.2 C)  TempSrc:  Oral Oral Oral  Resp:  Height:  Weight:    78.064 kg (172 lb 1.6 oz)  SpO2:  100% 98% 98%    Intake/Output Summary (Last 24 hours) at 01/30/16 1141 Last data filed at 01/30/16 0839  Gross per 24 hour  Intake 2208.13 ml  Output   3375 ml    Net -1166.87 ml   Filed Weights   01/28/16 0544 01/28/16 1415 01/30/16 0513  Weight: 79.379 kg (175 lb) 78.2 kg (172 lb 6.4 oz) 78.064 kg (172 lb 1.6 oz)    Examination:  Filed Vitals:   01/29/16 0931 01/29/16 1446 01/29/16 2034 01/30/16 0513  BP: 167/62 178/85 173/56 175/88  Pulse: 60 52 50 49  Temp:  97.5 F (36.4 C) 97.4 F (36.3 C) 97.2 F (36.2 C)  TempSrc:  Oral Oral Oral  Resp:  18 16 16   Height:      Weight:    78.064 kg (172 lb 1.6 oz)  SpO2:  100% 98% 98%   Constitutional: well nourished, well developed, Alert, awake, Oriented x2  Respiratory: clear to auscultation bilaterally, no wheezing, no crackles. Normal respiratory effort. No accessory muscle use.  Cardiovascular: Regular rate and rhythm. No extremity edema. Abdomen: Soft, non tender, non distended. Bowel sounds positive.  Musculoskeletal: no clubbing / cyanosis.  Neurologic: non focal     Data Reviewed: I have personally reviewed following labs and imaging studies  CBC:  Recent Labs Lab 01/24/16 0355 01/25/16 0353 01/29/16 0339 01/30/16 0402  WBC 6.2 5.6 5.8 4.6  HGB 9.5* 9.3* 9.8* 8.3*  HCT 27.7* 26.7* 28.1* 23.9*  MCV 87.4 86.7 87.3 85.1  PLT 161 171 200 161   Basic Metabolic Panel:  Recent Labs Lab 01/26/16 0431 01/27/16 0340 01/28/16 0344 01/29/16 0339 01/30/16 0402  NA 138 137 134* 136 136  K 5.1 4.5 4.7 5.0 4.7  CL 104 106 105 105 113*  CO2 21* 21* 18* 20* 17*  GLUCOSE 198* 265* 240* 318* 222*  BUN 73* 73* 68* 68* 57*  CREATININE 4.96* 4.69* 4.60* 4.48* 3.97*  CALCIUM 8.2* 8.2* 8.2* 8.5* 8.7*  PHOS 7.5* 6.6* 6.0* 5.4* 5.1*   GFR: Estimated Creatinine Clearance: 16.9 mL/min (by C-G formula based on Cr of 3.97). Liver Function Tests:  Recent Labs Lab 01/26/16 0431 01/27/16 0340 01/28/16 0344 01/29/16 0339 01/30/16 0402  ALBUMIN 2.9* 3.0* 3.0* 3.0* 2.7*   Coagulation Profile:  Recent Labs Lab 01/26/16 0431 01/27/16 0340 01/28/16 0344 01/29/16 0339  01/30/16 0402  INR 2.51* 2.52* 2.22* 1.85* 1.66*   Cardiac Enzymes: No results for input(s): CKTOTAL, CKMB, CKMBINDEX, TROPONINI in the last 168 hours.  Recent Labs Lab 01/29/16 1159 01/29/16 1658 01/29/16 2040 01/30/16 0745 01/30/16 1131  GLUCAP 203* 310* 164* 135* 152*   Urine analysis:    Component Value Date/Time   COLORURINE YELLOW 01/21/2016 1434   APPEARANCEUR CLOUDY* 01/21/2016 1434   LABSPEC 1.013 01/21/2016 1434   PHURINE 7.0 01/21/2016 1434   GLUCOSEU 500* 01/21/2016 1434   HGBUR LARGE* 01/21/2016 1434   BILIRUBINUR NEGATIVE 01/21/2016 1434   KETONESUR NEGATIVE 01/21/2016 1434   PROTEINUR 30* 01/21/2016 1434   UROBILINOGEN 0.2 05/30/2011 1144   NITRITE NEGATIVE 01/21/2016 1434   LEUKOCYTESUR MODERATE* 01/21/2016 1434   Sepsis Labs: Invalid input(s): PROCALCITONIN, LACTICIDVEN  No results found for this or any previous visit (from the past 240 hour(s)).    Radiology Studies: No results found.   Scheduled Meds: . sodium chloride   Intravenous Once  . sodium chloride   Intravenous Once  . amitriptyline  25 mg Oral QHS  . atorvastatin  20 mg Oral Daily  . dutasteride  0.5 mg Oral Daily  . insulin aspart  0-9 Units Subcutaneous TID WC  . insulin aspart  4 Units Subcutaneous TID WC  . insulin glargine  18 Units Subcutaneous Daily  . lanthanum  1,000 mg Oral TID WC  . metoprolol tartrate  25 mg Oral BID  . pantoprazole  40 mg Oral Q0600  . polyethylene glycol  17 g Oral BID  . senna  1 tablet Oral BID  . warfarin  4 mg Oral Once  . Warfarin - Pharmacist Dosing Inpatient   Does not apply q1800   Continuous Infusions: . sodium chloride 125 mL/hr at 01/29/16 2313   Zannie Cove MD [facility] Pager [phone]  If 7PM-7AM, please contact night-coverage [email or web address] Password [facility] 01/30/2016, 11:41 AM

## 2016-01-30 NOTE — Progress Notes (Signed)
Foley output from 0600 to 1200 was 1200mL. As per MD order, 123100mL/6 hour = 25800mL/hr for next 6 hours. Order placed for 1/2 NS at 26100mL/hr for 6 hours.

## 2016-01-30 NOTE — Progress Notes (Signed)
Occupational Therapy Treatment Patient Details Name: Paul LodgeMax B Guerra MRN: [id number] DOB: Sep 01, 1935 Today's Date: 01/30/2016    History of present illness pt admitted to the hosptial  with acute renal failure  daughter reports he has been having excessive urination.    OT comments  Limited session today due to patient fatigue/reports of "not feeling well." OT will continue to follow.  Follow Up Recommendations  Home health OT;Supervision/Assistance - 24 hour    Equipment Recommendations  None recommended by OT    Recommendations for Other Services      Precautions / Restrictions Precautions Precautions: Fall       Mobility Bed Mobility               General bed mobility comments: min A to reposition in bed  Transfers                      Balance                                   ADL Overall ADL's : Needs assistance/impaired Eating/Feeding: Independent;Bed level   Grooming: Wash/dry hands;Wash/dry face;Set up                               Functional mobility during ADLs: Minimal assistance General ADL Comments: Patient asleep upon arrival; awakens to verbal stimuli. Reports, "I feel terrible today." Limited session of assisting patient with repositioning in the bed followed by washing face/hands with warm cloth. Patient requested to go back to sleep. OT will follow.      Vision                     Perception     Praxis      Cognition   Behavior During Therapy: WFL for tasks assessed/performed Overall Cognitive Status: Within Functional Limits for tasks assessed                       Extremity/Trunk Assessment               Exercises     Shoulder Instructions       General Comments      Pertinent Vitals/ Pain       Pain Assessment: No/denies pain  Home Living                                          Prior Functioning/Environment              Frequency Min  2X/week     Progress Toward Goals  OT Goals(current goals can now be found in the care plan section)  Progress towards OT goals: Not progressing toward goals - comment (fatigue limiting session today; will continue to assess )     Plan Discharge plan remains appropriate    Co-evaluation                 End of Session     Activity Tolerance Patient limited by fatigue   Patient Left in bed;with call bell/phone within reach;with bed alarm set   Nurse Communication          Time: [id number] OT Time Calculation (min): 10 min  Charges: OT General Charges $  OT Visit: 1 Procedure OT Treatments $Self Care/Home Management : 8-22 mins  Paul Guerra A 01/30/2016, 10:02 AM

## 2016-01-30 NOTE — Progress Notes (Signed)
CM continues to follow and will update [facility] care when time for pt discharge. Sandford Crazeora Aliza Moret, RN,BSN,NCM [phone]

## 2016-01-30 NOTE — Progress Notes (Signed)
Physical Therapy Treatment Patient Details Name: Paul Guerra MRN: [id number] DOB: 1935-07-06 Today's Date: 01/30/2016    History of Present Illness pt admitted to the hosptial  with acute renal failure  daughter reports he has been having excessive urination.     PT Comments    Assisted pt OOB to amb to bathroom.  Had a BM.  Assisted with hygiene then amb to recliner.   Follow Up Recommendations  Home health PT     Equipment Recommendations       Recommendations for Other Services       Precautions / Restrictions Precautions Precautions: Fall Restrictions Weight Bearing Restrictions: No    Mobility  Bed Mobility Overal bed mobility: Needs Assistance Bed Mobility: Supine to Sit     Supine to sit: Supervision;HOB elevated     General bed mobility comments: min A to reposition in bed  Transfers Overall transfer level: Needs assistance Equipment used: Rolling walker (2 wheeled) Transfers: Sit to/from Stand Sit to Stand: Min guard;Supervision         General transfer comment: 25% VC's on proper tech.  Pt impulsive.  Safety with turn completion.    Ambulation/Gait Ambulation/Gait assistance: Min assist;Mod assist Ambulation Distance (Feet): 22 Feet Assistive device: Rolling walker (2 wheeled) Gait Pattern/deviations: Step-to pattern;Step-through pattern Gait velocity: decreased   General Gait Details: unsteady walk requiring 25% assist to amneuver walker around obsticles.  Pt only tolerated amb to and from bathroom due to fatigue.     Stairs            Wheelchair Mobility    Modified Rankin (Stroke Patients Only)       Balance                                    Cognition Arousal/Alertness: Awake/alert Behavior During Therapy: WFL for tasks assessed/performed Overall Cognitive Status: Within Functional Limits for tasks assessed                      Exercises      General Comments        Pertinent Vitals/Pain  Pain Assessment: No/denies pain    Home Living                      Prior Function            PT Goals (current goals can now be found in the care plan section) Progress towards PT goals: Progressing toward goals    Frequency  Min 3X/week    PT Plan Current plan remains appropriate    Co-evaluation             End of Session Equipment Utilized During Treatment: Gait belt Activity Tolerance: Treatment limited secondary to medical complications (Comment) Patient left: in chair;with chair alarm set;with nursing/sitter in room;with family/visitor present     Time: [id number] PT Time Calculation (min) (ACUTE ONLY): 29 min  Charges:  $Gait Training: 8-22 mins $Therapeutic Activity: 8-22 mins                    G Codes:      Felecia ShellingLori Austina Constantin  PTA [facility]  Acute  Rehab Pager      [phone]

## 2016-01-30 NOTE — Progress Notes (Signed)
ANTICOAGULATION CONSULT NOTE - Follow Up Consult  Pharmacy Consult for Warfarin Indication: atrial fibrillation  Allergies  Allergen Reactions  . Nitroglycerin Anaphylaxis  . Penicillins Anaphylaxis, Hives and Shortness Of Breath    Has patient had a PCN reaction causing immediate rash, facial/tongue/throat swelling, SOB or lightheadedness with hypotension: Yes Has patient had a PCN reaction causing severe rash involving mucus membranes or skin necrosis: Yes Has patient had a PCN reaction that required hospitalization Yes Has patient had a PCN reaction occurring within the last 10 years: No If all of the above answers are "NO", then may proceed with Cephalosporin use.   . Sudafed  [Pseudoephedrine Hcl] Hives  . Tetracycline Shortness Of Breath    Other reaction(s): SHORTNESS OF BREATH  . Vancomycin Hives  . Doxycycline Rash    Other reaction(s): RASH  . Sulfa Antibiotics Rash    Patient Measurements: Height: 6\' 3"  (190.5 cm) Weight: 172 lb 1.6 oz (78.064 kg) IBW/kg (Calculated) : 84.5  Vital Signs: Temp: 97.2 F (36.2 C) (04/20 0513) Temp Source: Oral (04/20 0513) BP: 175/88 mmHg (04/20 0513) Pulse Rate: 49 (04/20 0513)  Labs:  Recent Labs  01/28/16 0344 01/29/16 0339 01/30/16 0402  HGB  --  9.8* 8.3*  HCT  --  28.1* 23.9*  PLT  --  200 161  LABPROT 24.4* 20.6* 19.1*  INR 2.22* 1.85* 1.66*  CREATININE 4.60* 4.48* 3.97*    Estimated Creatinine Clearance: 16.9 mL/min (by C-G formula based on Cr of 3.97).  Assessment: 81yo M with PMH of anemia, atrial fibrillation (on warfarin), DM, BPH, and colon cancer who is currently admitted for acute renal failure, CAP, and UTI. Pharmacy consulted to assist with dosing wawrfarin while patient is in the hospital.  Home dose is 5mg  T/R/Sat and 2.5mg  all other days  01/30/2016 - INR 1.66, subtherapeutic; received 2.5mg  4/19 at 1351 - CBC: Hgb, Hct, plts all decreased from yesterday. Hgb, Hct low; plts WNL - No bleeding  reported or documented - Procedure on 4/10: Cystoscopy, right retrograde ureteropyelogram, right double-J stent placement. No bleeding described in procedure note. - Tolerating diet  Goal of Therapy:  INR 2-3 Monitor platelets by anticoagulation protocol: Yes   Plan:  - Warfarin 4mg  today at 1200 - Daily PT/INR  Lakeva Hollon 01/30/2016,8:28 AM

## 2016-01-30 NOTE — Progress Notes (Signed)
Subjective: Interval History: has no complaint , tired. 2.2L in and 3.7 L out. Creat down 3.9 today   Objective: Vital signs in last 24 hours: Temp:  [97.2 F (36.2 C)-97.5 F (36.4 C)] 97.2 F (36.2 C) (04/20 0513) Pulse Rate:  [49-60] 49 (04/20 0513) Resp:  [16-18] 16 (04/20 0513) BP: (167-178)/(56-88) 175/88 mmHg (04/20 0513) SpO2:  [98 %-100 %] 98 % (04/20 0513) Weight:  [78.064 kg (172 lb 1.6 oz)] 78.064 kg (172 lb 1.6 oz) (04/20 0513) Weight change: -0.136 kg (-4.8 oz)  Intake/Output from previous day: 04/19 0701 - 04/20 0700 In: 2208.1 [P.O.:1180; I.V.:1028.1] Out: 3775 [Urine:3775] Intake/Output this shift: Total I/O In: 360 [P.O.:360] Out: -   General appearance: cooperative, no distress, pale and slowed mentation Dry mouth and lips Resp: clear to auscultation bilaterally Cardio: regular rate and rhythm, S1, S2 normal and systolic murmur: holosystolic 2/6, blowing at apex GI: soft, non-tender; bowel sounds normal; no masses,  no organomegaly Extremities: extremities normal, atraumatic, no cyanosis or edema  Lab Results:  Recent Labs  01/29/16 0339 01/30/16 0402  WBC 5.8 4.6  HGB 9.8* 8.3*  HCT 28.1* 23.9*  PLT 200 161   BMET:   Recent Labs  01/29/16 0339 01/30/16 0402  NA 136 136  K 5.0 4.7  CL 105 113*  CO2 20* 17*  GLUCOSE 318* 222*  BUN 68* 57*  CREATININE 4.48* 3.97*  CALCIUM 8.5* 8.7*   No results for input(s): PTH in the last 72 hours. Iron Studies: No results for input(s): IRON, TIBC, TRANSFERRIN, FERRITIN in the last 72 hours.  Studies/Results: No results found.  I have reviewed the patient's current medications.  Assessment/Plan: 1 AKI - last cr 1.1 from 7/16.  Presented cr 3.7 (hypotension/ acei/ R hydro sp stent). Stalled recov, prob osm diuresis from ^BUN/glu. Improving today 2 Anemia stable 3 R hydro sp stent 4 Yeast UTI sp rx 5 DM controlled 6 Hx renal stones  P cont replace 1:1 losses + add'l 125/ hr   LOS: 13 days    Oluwatomisin Hustead D 01/30/2016,9:20 AM

## 2016-01-30 NOTE — Progress Notes (Signed)
NT just informed me that he emptied 650cc of urine around 2030 when he did vitals tonight. So pt had a total of 900cc of output during the 6pm-12am period. IV rate reset to 150cc/hr of 1/2NS. Mick SellShannon Otha Monical RN

## 2016-01-31 ENCOUNTER — Inpatient Hospital Stay (HOSPITAL_COMMUNITY): Payer: Medicare Other

## 2016-01-31 DIAGNOSIS — N400 Enlarged prostate without lower urinary tract symptoms: Secondary | ICD-10-CM

## 2016-01-31 LAB — RENAL FUNCTION PANEL
ALBUMIN: 3 g/dL — AB (ref 3.5–5.0)
ANION GAP: 9 (ref 5–15)
BUN: 54 mg/dL — ABNORMAL HIGH (ref 6–20)
CALCIUM: 8.4 mg/dL — AB (ref 8.9–10.3)
CO2: 19 mmol/L — ABNORMAL LOW (ref 22–32)
CREATININE: 3.99 mg/dL — AB (ref 0.61–1.24)
Chloride: 112 mmol/L — ABNORMAL HIGH (ref 101–111)
GFR calc Af Amer: 15 mL/min — ABNORMAL LOW (ref >60)
GFR calc non Af Amer: 13 mL/min — ABNORMAL LOW (ref >60)
GLUCOSE: 110 mg/dL — AB (ref 65–99)
PHOSPHORUS: 4.6 mg/dL (ref 2.5–4.6)
Potassium: 4.5 mmol/L (ref 3.5–5.1)
SODIUM: 140 mmol/L (ref 135–145)

## 2016-01-31 LAB — CBC
HCT: 26.5 % — ABNORMAL LOW (ref 39.0–52.0)
HEMOGLOBIN: 9.1 g/dL — AB (ref 13.0–17.0)
MCH: 29.6 pg (ref 26.0–34.0)
MCHC: 34.3 g/dL (ref 30.0–36.0)
MCV: 86.3 fL (ref 78.0–100.0)
PLATELETS: 201 10*3/uL (ref 150–400)
RBC: 3.07 MIL/uL — ABNORMAL LOW (ref 4.22–5.81)
RDW: 13.4 % (ref 11.5–15.5)
WBC: 5.6 10*3/uL (ref 4.0–10.5)

## 2016-01-31 LAB — PROTIME-INR
INR: 1.56 — ABNORMAL HIGH (ref 0.00–1.49)
Prothrombin Time: 18.1 seconds — ABNORMAL HIGH (ref 11.6–15.2)

## 2016-01-31 LAB — GLUCOSE, CAPILLARY
Glucose-Capillary: 88 mg/dL (ref 65–99)
Glucose-Capillary: 141 mg/dL — ABNORMAL HIGH (ref 65–99)
Glucose-Capillary: 132 mg/dL — ABNORMAL HIGH (ref 65–99)
Glucose-Capillary: 96 mg/dL (ref 65–99)

## 2016-01-31 MED ORDER — LORAZEPAM 2 MG/ML IJ SOLN: 1.0000 mg | Freq: Once | INTRAMUSCULAR | Status: DC

## 2016-01-31 MED ORDER — WARFARIN SODIUM 4 MG PO TABS
4.0000 mg | ORAL_TABLET | Freq: Once | ORAL | Status: AC
  Administered 2016-01-31: 4 mg via ORAL
  Filled 2016-01-31 (×2): qty 1

## 2016-01-31 MED ORDER — METOPROLOL TARTRATE 1 MG/ML IV SOLN
2.5000 mg | Freq: Once | INTRAVENOUS | Status: AC
  Administered 2016-01-31: 2.5 mg via INTRAVENOUS
  Filled 2016-01-31: qty 5

## 2016-01-31 MED ORDER — SODIUM CHLORIDE 0.9 % IV SOLN: INTRAVENOUS | Status: DC

## 2016-01-31 MED ORDER — SODIUM CHLORIDE 0.9 % IV BOLUS (SEPSIS)
3000.0000 mL | Freq: Once | INTRAVENOUS | Status: AC
  Administered 2016-01-31: 3000 mL via INTRAVENOUS

## 2016-01-31 MED ORDER — SODIUM CHLORIDE 0.9 % IV SOLN
INTRAVENOUS | Status: DC
  Administered 2016-01-31: 1000 mL via INTRAVENOUS

## 2016-01-31 MED ORDER — METOPROLOL TARTRATE 25 MG PO TABS
50.0000 mg | ORAL_TABLET | Freq: Two times a day (BID) | ORAL | Status: DC
Start: 2016-01-31 — End: 2016-02-01
  Administered 2016-01-31 (×2): 50 mg via ORAL
  Filled 2016-01-31 (×2): qty 1

## 2016-01-31 MED ORDER — ENOXAPARIN SODIUM 80 MG/0.8ML ~~LOC~~ SOLN
1.0000 mg/kg | SUBCUTANEOUS | Status: DC
  Administered 2016-01-31: 80 mg via SUBCUTANEOUS
  Filled 2016-01-31: qty 0.8

## 2016-01-31 MED ORDER — METOPROLOL TARTRATE 1 MG/ML IV SOLN
5.0000 mg | Freq: Once | INTRAVENOUS | Status: AC
  Administered 2016-02-01: 5 mg via INTRAVENOUS

## 2016-01-31 MED ORDER — SODIUM CHLORIDE 0.45 % IV SOLN: Freq: Once | INTRAVENOUS | Status: AC

## 2016-01-31 MED ORDER — SODIUM CHLORIDE 0.45 % IV SOLN
Freq: Once | INTRAVENOUS | Status: AC
  Administered 2016-01-31: 1000 mL via INTRAVENOUS

## 2016-01-31 NOTE — Progress Notes (Signed)
Nursing Note: Orders for EKG and completed. Paged rapid Response Nurse and she promptly came, administered lopressor 2.5 mg IV and pt on monitor .Paged on-call again and made aware of results of EKG and orders for fluids for pt. No new orders obtained.0600 Bp 154/90 P-110 and ca see fluctuations on monitor  [phone] BP 156/93 P-117.Pt resting quietly in bed and denies any distress.0630 BP-160/80 p-106. Pulse checked radial,irregular.wbb

## 2016-01-31 NOTE — Progress Notes (Addendum)
PROGRESS NOTE  NIHAL MARZELLA ZOX:[id number] DOB: 10-10-1935 DOA: 01/17/2016 PCP: No primary care provider on file. Outpatient Specialists:    LOS: 14 days   Subjective: No complaints, anxious to go home  Brief Narrative: Transfer from [facility] on 01/17/16. Patient is a 81 year old male with history of pancreatitis, DM, HTN, recurrent UTI, recurrent kidney stones, atrial fibrillation, and colon cancer (right hemicolectomy). Presented to [facility] ED with elevated creatinine, probable UTI, and pneumonia. He has been having urinary frequency, dysuria, confusion, and suprapubic abdominal pain over the past month. He saw PCP 2 days prior to admission and was started on ciprofloxacin for UTI, but on his way home his PCP called to tell him to go to ED for IV fluids and antibiotics. Blood pressure was 97/51, CT showed hydronephrosis and kidney stones, and chest x-ray showed pneumonia. Acute renal failure not improving very well, thought to be secondary to ATN.  Renal following  Assessment & Plan:  Acute renal failure - ATN and hydronephrosis related - creatinine stabilized but no improvement so far, on admission was 3.7, peaked at 5.2 - in state of Osmotic diuresis, urine output 6L yesterday and 2L this am -on NS at 125cc/hr and getting 1:1 replacement of losses (200cc/hr 1/2NS this am) - Renal following, creatinine was starting to improve, plateaued today - Patient s/p cystoscopy with right ureteral stenting 01/20/2016, Hydronephrosis resolved based on last [place] - Per Renal  Metabolic acidosis - Resolved.   Chest pain - 01/18/16 patient stated chest pain lasted only 30 seconds with both atypical features. - Patient with prior cardiac history. EKG with atrial fibrillation. Cardiac enzymes negative 3. - Resolved   Urinary tract infection/candiduria - Urine cultures with greater than 100,000 yeast.  - Levofloxacin discontinued 4/12. - Diflucan started on 4/13-stop after todays dose,  completed 7d  Atrial fibrillation - HR up, increase metoprolol  - INR subtherapeutic, continue coumadin, bridge with lovenox  Hypertension - Lisinopril stopped secondary to worsening renal function -BP trending higher  Hyperlipidemia - Continue Lipitor  Right hydronephrosis/probable kidney stones - Per CT from outside hospital.  - Renal ultrasound with moderate right hydronephrosis and chronic UPJ stenosis. No evidence of left hydronephrosis area and left lower pole renal calculi. Patient status post cystoscopy and right retrograde pyelogram with double-J stent placement 01/20/2016 - Resolved per urology   Constipation - Continue MiraLAX twice daily as well as Senokot for bowel regimen.  Community acquired pneumonia - Sputum Gram stain and culture pending.  - Urine strep pneumococcus antigen negative.  - Rx with levofloxacin, off Abx now, will need repeat CXR in 4 weeks   Dehydration - Saline lock IV fluids   Chronic anemia - No overt bleeding. - Status post 2 units PRBC on 4/10  - hb improved and stable since  BPH - Status post Foley catheter placement.  - Continue Avodart.   Diabetes mellitus - Hemoglobin A1c on 4/7 was 9.7.  - CBGs stable now  - Continue Lantus and SSI,   Code Status: Full Family Communication: none at bedside Disposition Plan: Home when renal function improves, ? 2-3days   Consultants: Urology Nephrology  Procedures: Renal ultrasound 01/18/2016 Chest x-ray 01/17/2016 Renal US 01/18/2016 and 01/24/2016 2 units PRBCs 01/20/2016  Antibiotics: IV Levaquin 01/17/2016  Objective: Filed Vitals:   01/31/16 0558 01/31/16 0608 01/31/16 0615 01/31/16 0630  BP: 154/90 156/93 122/80 160/80  Pulse: 116     Temp:      TempSrc:      Resp:  20 22 18 20   Height:      Weight:      SpO2:        Intake/Output Summary (Last 24 hours) at 01/31/16 1149 Last data filed at 01/31/16 1122  Gross per 24 hour  Intake   2340 ml  Output   8200 ml  Net   -5860 ml   Filed Weights   01/28/16 1415 01/30/16 0513 01/31/16 0522  Weight: 78.2 kg (172 lb 6.4 oz) 78.064 kg (172 lb 1.6 oz) 77.973 kg (171 lb 14.4 oz)    Examination:  Filed Vitals:   01/31/16 0558 01/31/16 0608 01/31/16 0615 01/31/16 0630  BP: 154/90 156/93 122/80 160/80  Pulse: 116     Temp:      TempSrc:      Resp: 20 22 18 20   Height:      Weight:      SpO2:       Constitutional: well nourished, well developed, Alert, awake, Oriented x2  Respiratory: clear to auscultation bilaterally, no wheezing, no crackles. Normal respiratory effort. No accessory muscle use.  Cardiovascular: Regular rate and rhythm. No extremity edema. Abdomen: Soft, non tender, non distended. Bowel sounds positive.  Musculoskeletal: no clubbing / cyanosis.  Neurologic: non focal     Data Reviewed: I have personally reviewed following labs and imaging studies  CBC:  Recent Labs Lab 01/25/16 0353 01/29/16 0339 01/30/16 0402 01/31/16 0323  WBC 5.6 5.8 4.6 5.6  HGB 9.3* 9.8* 8.3* 9.1*  HCT 26.7* 28.1* 23.9* 26.5*  MCV 86.7 87.3 85.1 86.3  PLT 171 200 161 201   Basic Metabolic Panel:  Recent Labs Lab 01/27/16 0340 01/28/16 0344 01/29/16 0339 01/30/16 0402 01/31/16 0337  NA 137 134* 136 136 140  K 4.5 4.7 5.0 4.7 4.5  CL 106 105 105 113* 112*  CO2 21* 18* 20* 17* 19*  GLUCOSE 265* 240* 318* 222* 110*  BUN 73* 68* 68* 57* 54*  CREATININE 4.69* 4.60* 4.48* 3.97* 3.99*  CALCIUM 8.2* 8.2* 8.5* 8.7* 8.4*  PHOS 6.6* 6.0* 5.4* 5.1* 4.6   GFR: Estimated Creatinine Clearance: 16.8 mL/min (by C-G formula based on Cr of 3.99). Liver Function Tests:  Recent Labs Lab 01/27/16 0340 01/28/16 0344 01/29/16 0339 01/30/16 0402 01/31/16 0337  ALBUMIN 3.0* 3.0* 3.0* 2.7* 3.0*   Coagulation Profile:  Recent Labs Lab 01/27/16 0340 01/28/16 0344 01/29/16 0339 01/30/16 0402 01/31/16 0323  INR 2.52* 2.22* 1.85* 1.66* 1.56*   Cardiac Enzymes: No results for input(s): CKTOTAL, CKMB,  CKMBINDEX, TROPONINI in the last 168 hours.  Recent Labs Lab 01/30/16 0745 01/30/16 1131 01/30/16 1634 01/30/16 2203 01/31/16 0720  GLUCAP 135* 152* 121* 134* 88   Urine analysis:    Component Value Date/Time   COLORURINE YELLOW 01/21/2016 1434   APPEARANCEUR CLOUDY* 01/21/2016 1434   LABSPEC 1.013 01/21/2016 1434   PHURINE 7.0 01/21/2016 1434   GLUCOSEU 500* 01/21/2016 1434   HGBUR LARGE* 01/21/2016 1434   BILIRUBINUR NEGATIVE 01/21/2016 1434   KETONESUR NEGATIVE 01/21/2016 1434   PROTEINUR 30* 01/21/2016 1434   UROBILINOGEN 0.2 05/30/2011 1144   NITRITE NEGATIVE 01/21/2016 1434   LEUKOCYTESUR MODERATE* 01/21/2016 1434   Sepsis Labs: Invalid input(s): PROCALCITONIN, LACTICIDVEN  No results found for this or any previous visit (from the past 240 hour(s)).    Radiology Studies: No results found.   Scheduled Meds: . sodium chloride   Intravenous Once  . sodium chloride   Intravenous Once  . amitriptyline  25 mg Oral QHS  .  atorvastatin  20 mg Oral Daily  . dutasteride  0.5 mg Oral Daily  . enoxaparin (LOVENOX) injection  1 mg/kg Subcutaneous Q24H  . insulin aspart  0-9 Units Subcutaneous TID WC  . insulin aspart  4 Units Subcutaneous TID WC  . insulin glargine  18 Units Subcutaneous Daily  . lanthanum  1,000 mg Oral TID WC  . metoprolol tartrate  50 mg Oral BID  . nicotine  14 mg Transdermal QHS  . pantoprazole  40 mg Oral Q0600  . polyethylene glycol  17 g Oral BID  . senna  1 tablet Oral BID  . Warfarin - Pharmacist Dosing Inpatient   Does not apply q1800   Continuous Infusions: . sodium chloride 125 mL/hr at 01/31/16 1112   Zannie Cove MD [facility] Pager [phone]  If 7PM-7AM, please contact night-coverage [email or web address] Password [facility] 01/31/2016, 11:49 AM

## 2016-01-31 NOTE — Progress Notes (Signed)
Paged N.P. To check Chest Xray results, N.P. Ordered to stopped maintanance fluids 14825ml/hr and restart Fluid challenge at 28450ml/hr. Patient is breathing better and will continue o2 at 2L. Will continue to monitor the patient.

## 2016-01-31 NOTE — Progress Notes (Addendum)
ANTICOAGULATION CONSULT NOTE - Follow Up Consult  Pharmacy Consult for Warfarin/Lovenox Indication: atrial fibrillation  Allergies  Allergen Reactions  . Nitroglycerin Anaphylaxis  . Penicillins Anaphylaxis, Hives and Shortness Of Breath    Has patient had a PCN reaction causing immediate rash, facial/tongue/throat swelling, SOB or lightheadedness with hypotension: Yes Has patient had a PCN reaction causing severe rash involving mucus membranes or skin necrosis: Yes Has patient had a PCN reaction that required hospitalization Yes Has patient had a PCN reaction occurring within the last 10 years: No If all of the above answers are "NO", then may proceed with Cephalosporin use.   . Sudafed  [Pseudoephedrine Hcl] Hives  . Tetracycline Shortness Of Breath    Other reaction(s): SHORTNESS OF BREATH  . Vancomycin Hives  . Doxycycline Rash    Other reaction(s): RASH  . Sulfa Antibiotics Rash    Patient Measurements: Height: 6\' 3"  (190.5 cm) Weight: 171 lb 14.4 oz (77.973 kg) IBW/kg (Calculated) : 84.5  Vital Signs: Temp: 97.9 F (36.6 C) (04/21 0522) Temp Source: Oral (04/21 0522) BP: 160/80 mmHg (04/21 0630) Pulse Rate: 116 (04/21 0558)  Labs:  Recent Labs  01/29/16 0339 01/30/16 0402 01/31/16 0323 01/31/16 0337  HGB 9.8* 8.3* 9.1*  --   HCT 28.1* 23.9* 26.5*  --   PLT 200 161 201  --   LABPROT 20.6* 19.1* 18.1*  --   INR 1.85* 1.66* 1.56*  --   CREATININE 4.48* 3.97*  --  3.99*    Estimated Creatinine Clearance: 16.8 mL/min (by C-G formula based on Cr of 3.99).  Assessment: 81yo M with PMH of anemia, atrial fibrillation (on warfarin), DM, BPH, and colon cancer who is currently admitted for acute renal failure, CAP, and UTI. Pharmacy consulted to assist with dosing wawrfarin while patient is in the hospital.  Home dose is 5mg  T/R/Sat and 2.5mg  all other days  Today, 01/31/2016 - INR subtherapeutic and trending downward -  received 2.5mg  4/19 and 4mg  on 4/20 - per  Md, will start therapeutic Lovenox while INR < 2 - CBC OK - No bleeding reported or documented - Procedure on 4/10: Cystoscopy, right retrograde ureteropyelogram, right double-J stent placement. No bleeding described in procedure note. - Tolerating diet  Goal of Therapy:  INR 2-3 Monitor platelets by anticoagulation protocol: Yes  AntiXa level 0.6-1   Plan:  1) Repeat 4mg  warfarin today 2) Daily INR 3) Start Lovenox 1mg /kg q24 for CrCl < 30 until INR therapeutic   Hessie KnowsJustin M Maciah Schweigert, PharmD, BCPS Pager [phone] 01/31/2016 8:55 AM

## 2016-01-31 NOTE — Care Management Note (Signed)
Case Management Note  Patient Details  Name: Paul Guerra MRN: [id number] Date of Birth: 07-Jun-1935  Subjective/Objective:  Transferred to 4e-has HHC orders-confirmed w/[facility] vickie-tel#[phone],fax#[phone] they have HHC orders w/SOC date 02/04/16-please fax d/c summary @ d/c.                  Action/Plan:d/c plan home w/HHC.   Expected Discharge Date:                 Expected Discharge Plan:  Home w Home Health Services  In-House Referral:     Discharge planning Services  CM Consult  Post Acute Care Choice:    Choice offered to:  Patient, Adult Children  DME Arranged:    DME Agency:     HH Arranged:  PT, OT HH Agency:  Other - See comment  Status of Service:  In process, will continue to follow  Medicare Important Message Given:  Yes Date Medicare IM Given:    Medicare IM give by:    Date Additional Medicare IM Given:    Additional Medicare Important Message give by:     If discussed at Long Length of Stay Meetings, dates discussed:    Additional Comments:  Paul Guerra, Paul Zoss, RN 01/31/2016, 12:39 PM

## 2016-01-31 NOTE — Progress Notes (Signed)
Nursing Note: Pulse 117,apical 112 and irregular.H/o a-fib,Pt resting quietly,denies pain,"feels fine".A; Paged on-call .wbb

## 2016-01-31 NOTE — Significant Event (Signed)
Rapid Response Event Note  Overview: Time Called: 0550 Arrival Time: 0555 Event Type: Cardiac  Initial Focused Assessment:   Interventions:   Event Summary: Name of Physician Notified: Maren ReamerKaren Kirby NP at 0540    at    Outcome: Stayed in room and stabalized  Event End Time: [address]0630  Sundy Houchins F

## 2016-01-31 NOTE — Significant Event (Deleted)
Rapid Response Event Note  Overview: Time Called: 0550 Arrival Time: 0555 Event Type: Cardiac  Initial Focused Assessment:   Interventions:   Event Summary: Name of Physician Notified: Karen Kirby NP at 0540    at    Outcome: Stayed in room and stabalized  Event End Time: 0630  Senay Sistrunk F 

## 2016-01-31 NOTE — Progress Notes (Addendum)
RRT called to room 1417 for pt with SOB, increased WOB and pale after returning to bed from BRP. Pt receiving fluid challenges with Renal following. (RRT called last pm for Cardiac issues with increased HR). Upon arrival to room, pt alert, oriented, able to speak in complete sentences and following commands. Upon ascultation lungs with wheezes and Rhonchi all lobes. Stat PCXR ordered. Bedside RN had placed [place] at 2 L for sats trending at 88%. On monitor VS: 167/98, HR 108 AFIB- baseline, RR 22, Sats 95% on [place] 2 L. RT called for Stat breathing tx. Will wait for PCXR results and call to MD. Pt with increased WOB but not in full distress. Informed nightshift RN, Junior, to call RRT if further needed. Nightshift RRT will check for results of PCXR and follow pt.

## 2016-01-31 NOTE — Significant Event (Signed)
Rapid Response Event Note  Overview: Time Called: 0550 Arrival Time: 0555 Event Type: Cardiac  Initial Focused Assessment:Patient has history of A-fib. Heart rate had been 50-60's. It was over 100 and EKG confirmed A-fib rate 112-120. Pt. Alert warm and dry with no complaints.    Interventions:EKG, Lopressor 2.5 mg IV given. Assessed home meds   Event Summary: Name of Physician Notified: Maren ReamerKaren Kirby NP at 0540    at    Outcome: Stayed in room and stabalized  Event End Time: 0630  Bayard HuggerDenny, Hatley Henegar B

## 2016-01-31 NOTE — Progress Notes (Deleted)
Subjective: Interval History: creat up, I/O 2.7 in and 6.2 L out yesterday  Objective: Vital signs in last 24 hours: Temp:  [97.9 F (36.6 C)-98.1 F (36.7 C)] 98 F (36.7 C) (04/21 1207) Pulse Rate:  [62-117] 116 (04/21 0558) Resp:  [18-22] 20 (04/21 1207) BP: (122-160)/(69-93) 152/69 mmHg (04/21 1207) SpO2:  [94 %-97 %] 97 % (04/21 1207) Weight:  [77.973 kg (171 lb 14.4 oz)] 77.973 kg (171 lb 14.4 oz) (04/21 0522) Weight change: -0.091 kg (-3.2 oz)  Intake/Output from previous day: 04/20 0701 - 04/21 0700 In: 2760 [P.O.:1260; I.V.:1500] Out: 6200 [Urine:6200] Intake/Output this shift: Total I/O In: 3820 [P.O.:720; I.V.:3100] Out: 3150 [Urine:3150]  General appearance: cooperative, no distress, pale and slowed mentation Dry mouth and lips Resp: clear to auscultation bilaterally Cardio: regular rate and rhythm, S1, S2 normal and systolic murmur: holosystolic 2/6, blowing at apex GI: soft, non-tender; bowel sounds normal; no masses,  no organomegaly Extremities: extremities normal, atraumatic, no cyanosis or edema  Lab Results:  Recent Labs  01/30/16 0402 01/31/16 0323  WBC 4.6 5.6  HGB 8.3* 9.1*  HCT 23.9* 26.5*  PLT 161 201   BMET:   Recent Labs  01/30/16 0402 01/31/16 0337  NA 136 140  K 4.7 4.5  CL 113* 112*  CO2 17* 19*  GLUCOSE 222* 110*  BUN 57* 54*  CREATININE 3.97* 3.99*  CALCIUM 8.7* 8.4*   No results for input(s): PTH in the last 72 hours. Iron Studies: No results for input(s): IRON, TIBC, TRANSFERRIN, FERRITIN in the last 72 hours.  Studies/Results: No results found.  I have reviewed the patient's current medications.  Assessment/Plan: 1 AKI - last cr 1.1 from 7/16, presented cr 3.7 > pk 5.0, now stalled recov, osm diuresis from ^BUN/glu; didn't get enough fluids yest, reordered  2 Anemia stable 3 R hydro sp stent 4 Yeast UTI sp rx 5 DM controlled 6 Hx renal stones  P cont replace 1:1 losses + add'l 125/ hr   LOS: 14 days    Nissa Stannard D 01/31/2016,3:45 PM

## 2016-01-31 NOTE — Progress Notes (Signed)
Upon f/u on pt status, spoke with Junior, RN reports he has updated N.P. On pt PCXR results new orders were received and he will continue to monitor and report any necessary changes.  Pt is sleeping now per RN.  Informed RN to call RRT if needed as well.

## 2016-01-31 NOTE — Progress Notes (Addendum)
Event Note:  [name] experiencing worsening dyspnea with decrease in oxygen saturation into 80s. Continuing to receive replacement IVF 1:1 (currently at 250 cc/hr) per Nephrology orders. Chest xray earlier this evening showed PNA and mild interstitial pulmonary edema.   Patient agitated, uncomfortable. Oxygen saturation in low 80s, HR 134 - afib. BP elevated 247/140 Heart : Irregularly irregular heart rate - Afib on monitor Lungs, crackles  Bilaterally.  PLAN:  Move to ICU - Room 1228  Lopressor for BP and HR  Repeat CXR, evaluate ABG, consider Bipap.   Holding replacement fluid after discussion with Dr. Hyman HopesWebb, Nephrology, Re-evaluating BMP now.  CCM consult   Addendum ABG (on NRB): Ph 7.048, PCO2 63.3, PO2 88.2, HCO3 16.6 Oxygen sat 90.1%  Plan to intubate.

## 2016-01-31 NOTE — Progress Notes (Signed)
Subjective: Interval History: creat up, I/O 2.7 in and 6.2 L out yesterday  Objective: Vital signs in last 24 hours: Temp:  [97.9 F (36.6 C)-98.2 F (36.8 C)] 98 F (36.7 C) (04/21 2031) Pulse Rate:  [62-128] 110 (04/21 2031) Resp:  [18-24] 22 (04/21 2031) BP: (122-160)/(69-97) 157/97 mmHg (04/21 2031) SpO2:  [89 %-97 %] 91 % (04/21 2031) Weight:  [77.973 kg (171 lb 14.4 oz)] 77.973 kg (171 lb 14.4 oz) (04/21 0522) Weight change: -0.091 kg (-3.2 oz)  Intake/Output from previous day: 04/20 0701 - 04/21 0700 In: 2760 [P.O.:1260; I.V.:1500] Out: 6200 [Urine:6200] Intake/Output this shift:    General appearance: cooperative, no distress, pale and slowed mentation Dry mouth and lips Resp: clear to auscultation bilaterally Cardio: regular rate and rhythm, S1, S2 normal and systolic murmur: holosystolic 2/6, blowing at apex GI: soft, non-tender; bowel sounds normal; no masses,  no organomegaly Extremities: extremities normal, atraumatic, no cyanosis or edema  Lab Results:  Recent Labs  01/30/16 0402 01/31/16 0323  WBC 4.6 5.6  HGB 8.3* 9.1*  HCT 23.9* 26.5*  PLT 161 201   BMET:   Recent Labs  01/30/16 0402 01/31/16 0337  NA 136 140  K 4.7 4.5  CL 113* 112*  CO2 17* 19*  GLUCOSE 222* 110*  BUN 57* 54*  CREATININE 3.97* 3.99*  CALCIUM 8.7* 8.4*   No results for input(s): PTH in the last 72 hours. Iron Studies: No results for input(s): IRON, TIBC, TRANSFERRIN, FERRITIN in the last 72 hours.  Studies/Results: Dg Chest Port 1 View  01/31/2016  CLINICAL DATA:  81 y/o male with c/o SOB today, seemed to be in decent shape during exam. Hx a-fib, DM, HTN EXAM: PORTABLE CHEST 1 VIEW COMPARISON:  01/17/2016 FINDINGS: Heart size is obscured by dense opacity at the lung bases, right greater than left. Kerley B-lines are consistent with interstitial pulmonary edema. Suspect right pleural effusion. IMPRESSION: 1. Dense bilateral lower lobe infiltrates, favoring infectious  process. 2. Mild interstitial pulmonary edema also noted. Electronically Signed   By: Norva PavlovElizabeth  Brown M.D.   On: 01/31/2016 19:58    I have reviewed the patient's current medications.  Assessment/Plan: 1 AKI - last cr 1.1 from 7/16, presented cr 3.7 > pk 5.0, now stalled recov, osm diuresis from ^BUN/glu; didn't get enough fluids yest, reordered  2 Anemia stable 3 R hydro sp stent 4 Yeast UTI sp rx 5 DM controlled 6 Hx renal stones  P cont replace 1:1 losses + add'l 125/ hr   LOS: 14 days   Kejuan Bekker D 01/31/2016,8:45 PM

## 2016-02-01 ENCOUNTER — Inpatient Hospital Stay (HOSPITAL_COMMUNITY): Payer: Medicare Other

## 2016-02-01 ENCOUNTER — Inpatient Hospital Stay (HOSPITAL_COMMUNITY): Payer: Medicare Other | Admitting: Anesthesiology

## 2016-02-01 DIAGNOSIS — N179 Acute kidney failure, unspecified: Secondary | ICD-10-CM | POA: Insufficient documentation

## 2016-02-01 DIAGNOSIS — J9601 Acute respiratory failure with hypoxia: Secondary | ICD-10-CM

## 2016-02-01 DIAGNOSIS — R0600 Dyspnea, unspecified: Secondary | ICD-10-CM

## 2016-02-01 LAB — URINALYSIS, ROUTINE W REFLEX MICROSCOPIC
BILIRUBIN URINE: NEGATIVE
Glucose, UA: NEGATIVE mg/dL
Ketones, ur: NEGATIVE mg/dL
NITRITE: NEGATIVE
PROTEIN: NEGATIVE mg/dL
Specific Gravity, Urine: 1.008 (ref 1.005–1.030)
pH: 5 (ref 5.0–8.0)

## 2016-02-01 LAB — GLUCOSE, CAPILLARY
GLUCOSE-CAPILLARY: 142 mg/dL — AB (ref 65–99)
GLUCOSE-CAPILLARY: 160 mg/dL — AB (ref 65–99)
GLUCOSE-CAPILLARY: 173 mg/dL — AB (ref 65–99)
GLUCOSE-CAPILLARY: 180 mg/dL — AB (ref 65–99)
Glucose-Capillary: 147 mg/dL — ABNORMAL HIGH (ref 65–99)
Glucose-Capillary: 190 mg/dL — ABNORMAL HIGH (ref 65–99)
Glucose-Capillary: 198 mg/dL — ABNORMAL HIGH (ref 65–99)

## 2016-02-01 LAB — BASIC METABOLIC PANEL
ANION GAP: 7 (ref 5–15)
Anion gap: 13 (ref 5–15)
BUN: 50 mg/dL — ABNORMAL HIGH (ref 6–20)
BUN: 66 mg/dL — ABNORMAL HIGH (ref 6–20)
CALCIUM: 8.5 mg/dL — AB (ref 8.9–10.3)
CHLORIDE: 118 mmol/L — AB (ref 101–111)
CO2: 19 mmol/L — ABNORMAL LOW (ref 22–32)
CO2: 18 mmol/L — AB (ref 22–32)
CREATININE: 3.79 mg/dL — AB (ref 0.61–1.24)
CREATININE: 4.36 mg/dL — AB (ref 0.61–1.24)
Calcium: 8.1 mg/dL — ABNORMAL LOW (ref 8.9–10.3)
Chloride: 114 mmol/L — ABNORMAL HIGH (ref 101–111)
GFR calc non Af Amer: 14 mL/min — ABNORMAL LOW (ref >60)
GFR, EST AFRICAN AMERICAN: 14 mL/min — AB (ref >60)
GFR, EST AFRICAN AMERICAN: 16 mL/min — AB (ref >60)
GFR, EST NON AFRICAN AMERICAN: 12 mL/min — AB (ref >60)
Glucose, Bld: 167 mg/dL — ABNORMAL HIGH (ref 65–99)
Glucose, Bld: 200 mg/dL — ABNORMAL HIGH (ref 65–99)
Potassium: 5.2 mmol/L — ABNORMAL HIGH (ref 3.5–5.1)
Potassium: 4.5 mmol/L (ref 3.5–5.1)
SODIUM: 144 mmol/L (ref 135–145)
SODIUM: 145 mmol/L (ref 135–145)

## 2016-02-01 LAB — PROTIME-INR
INR: 1.65 — ABNORMAL HIGH (ref 0.00–1.49)
PROTHROMBIN TIME: 19 s — AB (ref 11.6–15.2)

## 2016-02-01 LAB — RENAL FUNCTION PANEL
ALBUMIN: 3 g/dL — AB (ref 3.5–5.0)
Anion gap: 10 (ref 5–15)
BUN: 53 mg/dL — AB (ref 6–20)
CALCIUM: 8.1 mg/dL — AB (ref 8.9–10.3)
CO2: 16 mmol/L — ABNORMAL LOW (ref 22–32)
Chloride: 117 mmol/L — ABNORMAL HIGH (ref 101–111)
Creatinine, Ser: 3.89 mg/dL — ABNORMAL HIGH (ref 0.61–1.24)
GFR calc Af Amer: 16 mL/min — ABNORMAL LOW (ref >60)
GFR calc non Af Amer: 14 mL/min — ABNORMAL LOW (ref >60)
GLUCOSE: 188 mg/dL — AB (ref 65–99)
PHOSPHORUS: 6.1 mg/dL — AB (ref 2.5–4.6)
Potassium: 5.6 mmol/L — ABNORMAL HIGH (ref 3.5–5.1)
SODIUM: 143 mmol/L (ref 135–145)

## 2016-02-01 LAB — CBC
HCT: 30.2 % — ABNORMAL LOW (ref 39.0–52.0)
HEMOGLOBIN: 10.3 g/dL — AB (ref 13.0–17.0)
MCH: 30.2 pg (ref 26.0–34.0)
MCHC: 34.1 g/dL (ref 30.0–36.0)
MCV: 88.6 fL (ref 78.0–100.0)
PLATELETS: 241 10*3/uL (ref 150–400)
RBC: 3.41 MIL/uL — AB (ref 4.22–5.81)
RDW: 13.7 % (ref 11.5–15.5)
WBC: 8.9 10*3/uL (ref 4.0–10.5)

## 2016-02-01 LAB — MRSA PCR SCREENING: MRSA BY PCR: NEGATIVE

## 2016-02-01 LAB — URINE MICROSCOPIC-ADD ON: Squamous Epithelial / HPF: NONE SEEN

## 2016-02-01 LAB — MAGNESIUM: Magnesium: 1.7 mg/dL (ref 1.7–2.4)

## 2016-02-01 LAB — LACTIC ACID, PLASMA
LACTIC ACID, VENOUS: 2.4 mmol/L — AB (ref 0.5–2.0)
Lactic Acid, Venous: 1.2 mmol/L (ref 0.5–2.0)

## 2016-02-01 LAB — BRAIN NATRIURETIC PEPTIDE: B Natriuretic Peptide: 792.4 pg/mL — ABNORMAL HIGH (ref 0.0–100.0)

## 2016-02-01 LAB — ECHOCARDIOGRAM COMPLETE
Height: 75 in
Weight: 2948.87 oz

## 2016-02-01 MED ORDER — FUROSEMIDE 10 MG/ML IJ SOLN
160.0000 mg | Freq: Three times a day (TID) | INTRAVENOUS | Status: DC
  Filled 2016-02-01 (×2): qty 16

## 2016-02-01 MED ORDER — DILTIAZEM HCL 100 MG IV SOLR
5.0000 mg/h | INTRAVENOUS | Status: DC
  Administered 2016-02-01: 5 mg/h via INTRAVENOUS
  Administered 2016-02-02: 15 mg/h via INTRAVENOUS
  Administered 2016-02-02: 5 mg/h via INTRAVENOUS
  Filled 2016-02-01 (×3): qty 100

## 2016-02-01 MED ORDER — LABETALOL HCL 5 MG/ML IV SOLN
10.0000 mg | INTRAVENOUS | Status: DC | PRN
  Administered 2016-02-01: 10 mg via INTRAVENOUS
  Filled 2016-02-01 (×2): qty 4

## 2016-02-01 MED ORDER — CHLORHEXIDINE GLUCONATE 0.12% ORAL RINSE (MEDLINE KIT)
15.0000 mL | Freq: Two times a day (BID) | OROMUCOSAL | Status: DC
  Administered 2016-02-01 – 2016-02-04 (×7): 15 mL via OROMUCOSAL

## 2016-02-01 MED ORDER — ENOXAPARIN SODIUM 80 MG/0.8ML ~~LOC~~ SOLN
80.0000 mg | SUBCUTANEOUS | Status: DC
  Administered 2016-02-01: 80 mg via SUBCUTANEOUS
  Filled 2016-02-01 (×2): qty 0.8

## 2016-02-01 MED ORDER — SODIUM CHLORIDE 0.9 % IV SOLN
50.0000 ug/h | INTRAVENOUS | Status: DC
  Administered 2016-02-01: 50 ug/h via INTRAVENOUS
  Filled 2016-02-01: qty 50

## 2016-02-01 MED ORDER — ANTISEPTIC ORAL RINSE SOLUTION (CORINZ)
7.0000 mL | Freq: Four times a day (QID) | OROMUCOSAL | Status: DC
  Administered 2016-02-01 – 2016-02-04 (×15): 7 mL via OROMUCOSAL

## 2016-02-01 MED ORDER — WARFARIN SODIUM 5 MG PO TABS
5.0000 mg | ORAL_TABLET | Freq: Once | ORAL | Status: AC
  Administered 2016-02-01: 5 mg via ORAL
  Filled 2016-02-01: qty 1

## 2016-02-01 MED ORDER — SUCCINYLCHOLINE CHLORIDE 20 MG/ML IJ SOLN
INTRAMUSCULAR | Status: DC | PRN
  Administered 2016-02-01: 100 mg via INTRAVENOUS

## 2016-02-01 MED ORDER — INSULIN ASPART 100 UNIT/ML ~~LOC~~ SOLN
0.0000 [IU] | SUBCUTANEOUS | Status: DC
  Administered 2016-02-01 (×2): 2 [IU] via SUBCUTANEOUS
  Administered 2016-02-01: 1 [IU] via SUBCUTANEOUS
  Administered 2016-02-01 (×2): 2 [IU] via SUBCUTANEOUS
  Administered 2016-02-02: 3 [IU] via SUBCUTANEOUS
  Administered 2016-02-02: 5 [IU] via SUBCUTANEOUS
  Administered 2016-02-02: 3 [IU] via SUBCUTANEOUS
  Administered 2016-02-02: 5 [IU] via SUBCUTANEOUS
  Administered 2016-02-02: 3 [IU] via SUBCUTANEOUS
  Administered 2016-02-02: 2 [IU] via SUBCUTANEOUS
  Administered 2016-02-03: 5 [IU] via SUBCUTANEOUS

## 2016-02-01 MED ORDER — LIDOCAINE HCL 2 % IJ SOLN
INTRAMUSCULAR | Status: AC
  Filled 2016-02-01: qty 20

## 2016-02-01 MED ORDER — FENTANYL CITRATE (PF) 100 MCG/2ML IJ SOLN
50.0000 ug | INTRAMUSCULAR | Status: DC | PRN
Start: 2016-02-01 — End: 2016-02-01
  Administered 2016-02-01: 50 ug via INTRAVENOUS

## 2016-02-01 MED ORDER — LIDOCAINE HCL (PF) 2 % IJ SOLN
10.0000 mL | Freq: Once | INTRAMUSCULAR | Status: DC
  Filled 2016-02-01: qty 10

## 2016-02-01 MED ORDER — PROPOFOL 1000 MG/100ML IV EMUL
INTRAVENOUS | Status: AC
Start: 2016-02-01 — End: 2016-02-01
  Administered 2016-02-01: 1000 mg
  Filled 2016-02-01: qty 100

## 2016-02-01 MED ORDER — PANTOPRAZOLE SODIUM 40 MG IV SOLR
40.0000 mg | Freq: Every day | INTRAVENOUS | Status: DC
  Administered 2016-02-01: 40 mg via INTRAVENOUS
  Filled 2016-02-01: qty 40

## 2016-02-01 MED ORDER — SODIUM BICARBONATE 8.4 % IV SOLN
100.0000 meq | Freq: Once | INTRAVENOUS | Status: AC
  Administered 2016-02-01: 100 meq via INTRAVENOUS
  Filled 2016-02-01: qty 100

## 2016-02-01 MED ORDER — DEXTROSE 5 % IV SOLN
160.0000 mg | Freq: Four times a day (QID) | INTRAVENOUS | Status: DC
  Administered 2016-02-01 – 2016-02-02 (×5): 160 mg via INTRAVENOUS
  Filled 2016-02-01 (×6): qty 16
  Filled 2016-02-01 (×2): qty 10
  Filled 2016-02-01 (×2): qty 16

## 2016-02-01 MED ORDER — METOLAZONE 10 MG PO TABS
10.0000 mg | ORAL_TABLET | Freq: Every day | ORAL | Status: DC
  Administered 2016-02-01: 10 mg via ORAL
  Filled 2016-02-01 (×2): qty 1

## 2016-02-01 MED ORDER — SODIUM CHLORIDE 0.9 % IV SOLN
0.0000 ug/h | INTRAVENOUS | Status: DC
  Administered 2016-02-01: 100 ug/h via INTRAVENOUS
  Administered 2016-02-02 (×2): 200 ug/h via INTRAVENOUS
  Administered 2016-02-02: 125 ug/h via INTRAVENOUS
  Filled 2016-02-01 (×3): qty 50

## 2016-02-01 MED ORDER — FUROSEMIDE 10 MG/ML IJ SOLN
160.0000 mg | Freq: Once | INTRAVENOUS | Status: AC
  Administered 2016-02-01: 160 mg via INTRAVENOUS
  Filled 2016-02-01: qty 10

## 2016-02-01 MED ORDER — SODIUM BICARBONATE 8.4 % IV SOLN
50.0000 meq | Freq: Once | INTRAVENOUS | Status: AC
  Administered 2016-02-01: 50 meq via INTRAVENOUS
  Filled 2016-02-01: qty 50

## 2016-02-01 MED ORDER — FENTANYL CITRATE (PF) 100 MCG/2ML IJ SOLN: 50.0000 ug | INTRAMUSCULAR | Status: DC | PRN

## 2016-02-01 MED ORDER — PROPOFOL 10 MG/ML IV BOLUS
INTRAVENOUS | Status: AC
  Filled 2016-02-01: qty 20

## 2016-02-01 MED ORDER — FENTANYL CITRATE (PF) 100 MCG/2ML IJ SOLN
INTRAMUSCULAR | Status: AC
  Administered 2016-02-01: 50 ug via INTRAVENOUS
  Filled 2016-02-01: qty 2

## 2016-02-01 MED ORDER — FENTANYL CITRATE (PF) 100 MCG/2ML IJ SOLN
INTRAMUSCULAR | Status: AC
  Administered 2016-02-01: 100 ug
  Filled 2016-02-01: qty 2

## 2016-02-01 MED ORDER — PROPOFOL 10 MG/ML IV BOLUS
INTRAVENOUS | Status: DC | PRN
  Administered 2016-02-01: 150 mg via INTRAVENOUS

## 2016-02-01 MED ORDER — VITAL HIGH PROTEIN PO LIQD
1000.0000 mL | ORAL | Status: DC
  Administered 2016-02-01 – 2016-02-02 (×3): 1000 mL
  Filled 2016-02-01 (×2): qty 1000

## 2016-02-01 NOTE — Progress Notes (Signed)
Subjective: Interval History: resp distress early am, intubated, CXR severe bilat edema. I/O 8L in/ 5 L out.   Objective: Vital signs in last 24 hours: Temp:  [97.8 F (36.6 C)-99.1 F (37.3 C)] 97.8 F (36.6 C) (04/22 1200) Pulse Rate:  [69-128] 72 (04/22 1046) Resp:  [15-29] 25 (04/22 1046) BP: (110-221)/(57-128) 162/69 mmHg (04/22 1046) SpO2:  [89 %-100 %] 100 % (04/22 1213) FiO2 (%):  [40 %-100 %] 40 % (04/22 1213) Weight:  [83.6 kg (184 lb 4.9 oz)] 83.6 kg (184 lb 4.9 oz) (04/22 0430) Weight change: 5.627 kg (12 lb 6.5 oz)  Intake/Output from previous day: 04/21 0701 - 04/22 0700 In: 8788.3 [P.O.:960; I.V.:3358.3; IV Piggyback:3000] Out: 6000 [Urine:6000] Intake/Output this shift: Total I/O In: 75 [I.V.:15; Other:60] Out: 200 [Urine:200]  Gen: on vent, sedated Resp: clear ant and lat Cor: RRR holosystolic M 2/6, blowing at apex GI: soft, non-tender; bowel sounds normal; no masses,  no organomegaly Ext: 1-2+ depend edema bilat Wt's up 83 kg   Lab Results:  Recent Labs  01/31/16 0323 02/01/16 0333  WBC 5.6 8.9  HGB 9.1* 10.3*  HCT 26.5* 30.2*  PLT 201 241   BMET:   Recent Labs  02/01/16 0024 02/01/16 0333  NA 144 143  K 5.2* 5.6*  CL 118* 117*  CO2 19* 16*  GLUCOSE 167* 188*  BUN 50* 53*  CREATININE 3.79* 3.89*  CALCIUM 8.1* 8.1*   No results for input(s): PTH in the last 72 hours. Iron Studies: No results for input(s): IRON, TIBC, TRANSFERRIN, FERRITIN in the last 72 hours.  Studies/Results: Dg Abd 1 View  02/01/2016  CLINICAL DATA:  Evaluate orogastric tube placement. Intubated patient. Acute renal failure. EXAM: ABDOMEN - 1 VIEW COMPARISON:  CT, 01/16/2016 FINDINGS: Oral/ nasogastric tube tip lies within the mid stomach. There are multiple densities in the visualized abdomen consistent with flecks if of residual barium. No evidence of bowel obstruction. IMPRESSION: Oral/nasogastric tube tip lies within the mid stomach. Electronically Signed   By:  Amie Portland M.D.   On: 02/01/2016 10:46   Portable Chest Xray  02/01/2016  CLINICAL DATA:  Post intubation. EXAM: PORTABLE CHEST 1 VIEW COMPARISON:  01/31/2016 at 1940 hour FINDINGS: Endotracheal tube is 7.1 cm from the carina. Progressive bilateral interstitial opacities concerning for increased pulmonary edema. Bibasilar opacities may be confluent edema or infectious, right greater than left. Probable developing right pleural effusion. Cardiomediastinal contours are unchanged. Remote right rib fracture. IMPRESSION: 1. Endotracheal tube 7.1 cm from the carina. 2. Progressive pulmonary edema. 3. More confluent right greater than lung base opacities may be confluent vascular structures, pneumonia or aspiration. Probable developing right pleural effusion. Electronically Signed   By: Rubye Oaks M.D.   On: 02/01/2016 01:56   Dg Chest Port 1 View  01/31/2016  CLINICAL DATA:  81 y/o male with c/o SOB today, seemed to be in decent shape during exam. Hx a-fib, DM, HTN EXAM: PORTABLE CHEST 1 VIEW COMPARISON:  01/17/2016 FINDINGS: Heart size is obscured by dense opacity at the lung bases, right greater than left. Kerley B-lines are consistent with interstitial pulmonary edema. Suspect right pleural effusion. IMPRESSION: 1. Dense bilateral lower lobe infiltrates, favoring infectious process. 2. Mild interstitial pulmonary edema also noted. Electronically Signed   By: Norva Pavlov M.D.   On: 01/31/2016 19:58   History: HTN >33yr, BPH post TURP, colon CA s/p resx, cardiac arrest in past, recurrent renal stones with lithotripsy x 3 at least, recurrent UTIs, Afib, DM,  [facility] primary MD with Cr of 4 . Last available was 1.12 from 7/16. Serial Cr here 3.7 to 3.9 to 4.1 and then 4.6. Had pyuria, R hydronephrosis and ? pneu on eval. Tx here with ivf with bicarb, R double J stent placed 4/10. Cr still rising. High Fena, has been on Fluconazole, Cipro, levaquin. Was on Norvasc,  lopressor, Lisinopril at home. Low bp on [facility].  4/7 Urine cx + > 100k yeast 4/7 UA - turbic, tntc WBC and RBC, 30 prot, many bact 4/11 UA - cloudy, no bact/ tntc WBC / tntc RBC, 30 prot  Assessment/Plan: 1 AKI - not recovering 2 resp failure/ pulm edema/ vol ^ 3 Anemia stable 4 R hydro sp stent 5 Yeast UTI sp rx 6 DM controlled 7 Hx renal stones  P - IV lasix 160 q 6 hrs, diurese as tolerated. Add zaroxlyn.    LOS: 15 days   Delrico Minehart D 02/01/2016,12:18 PM

## 2016-02-01 NOTE — Progress Notes (Signed)
PULMONARY / CRITICAL CARE MEDICINE   Name: Paul Guerra MRN: [id number] DOB: [birth year]    ADMISSION DATE:  01/17/2016  CHIEF COMPLAINT:  Hypoxemic and hypercapnic respiratory failure  INITIAL PRESENTATION:  Patient is intubated and sedated, HPI taken from chart review. Transfer from [facility] on 01/17/16. Patient is a 82 year old male with history of pancreatitis, DM, HTN, recurrent UTI, recurrent kidney stones, atrial fibrillation, and colon cancer (right hemicolectomy). Presented to [facility] ED with elevated creatinine, probable UTI, and pneumonia. He has been having urinary frequency, dysuria, confusion, and suprapubic abdominal pain over the past month. He saw PCP 2 days prior to admission and was started on ciprofloxacin for UTI, but on his way home his PCP called to tell him to go to ED for IV fluids and antibiotics. Blood pressure was 97/51, CT showed hydronephrosis and kidney stones, and chest x-ray showed pneumonia. Acute renal failure not improving very well, thought to be secondary to ATN.   SIGNIFICANT EVENTS: On 01/30/2016 the patient had what appears to have been a post ATN diuresis with 6L output followed by 2L diuresis on 01/31/2016.  He was initiated on an aggressive volume resuscitation protocol that resulted in >4L within 12 hours.  He developed SOB and progressive respiratory distress.  He was moved to the ICU today and was found to have acute hypoxemic and hypercapnic respiratory failure.  He developed acute encephalopathy and was minimally responsive.  He was intubated emergently by CRNA.   PAST MEDICAL HISTORY :   has a past medical history of Recurrent nephrolithiasis (01/17/2016); Chronic a-fib (HCC) (01/17/2016); DM (diabetes mellitus) (HCC) (01/17/2016); BPH (benign prostatic hyperplasia) (01/17/2016); History of colon cancer (01/17/2016); Constipation (01/17/2016); HTN (hypertension), benign (01/17/2016); Chronic pancreatitis (HCC) (01/17/2016); Hyperlipidemia (01/17/2016); Cardiac arrest  (HCC) (01/17/2016); and Chewing tobacco use (01/17/2016).  has past surgical history that includes Cholecystectomy; Colon surgery; Cardiac catheterization; Transurethral resection of prostate; Hernia repair (Right, 09/03/2014); Lithotripsy; Eye surgery; and Cystoscopy w/ ureteral stent placement (Right, 01/20/2016). Prior to Admission medications   Medication Sig Start Date End Date Taking? Authorizing Provider  ALPRAZolam Prudy Feeler) 0.5 MG tablet Take 0.5 mg by mouth 3 (three) times daily as needed for anxiety or sleep.   Yes Historical Provider, MD  amitriptyline (ELAVIL) 25 MG tablet Take 25 mg by mouth at bedtime.   Yes Historical Provider, MD  amLODipine (NORVASC) 2.5 MG tablet Take 2.5 mg by mouth daily.   Yes Historical Provider, MD  atorvastatin (LIPITOR) 20 MG tablet Take 20 mg by mouth daily.   Yes Historical Provider, MD  dutasteride (AVODART) 0.5 MG capsule Take 0.5 mg by mouth daily.   Yes Historical Provider, MD  GARLIC PO Take 1 capsule by mouth daily.   Yes Historical Provider, MD  insulin glargine (LANTUS) 100 UNIT/ML injection Inject into the skin at bedtime. Inject 35 units daily increase by 2 units daily until fasting sugars are consistently below 160 Paul Guerra 50 units per day   Yes Historical Provider, MD  insulin lispro (HUMALOG) 100 UNIT/ML injection Inject 7 Units into the skin 3 (three) times daily before meals.   Yes Historical Provider, MD  lisinopril (PRINIVIL,ZESTRIL) 20 MG tablet Take 20 mg by mouth daily.   Yes Historical Provider, MD  metoprolol (LOPRESSOR) 50 MG tablet Take 50 mg by mouth 2 (two) times daily.   Yes Historical Provider, MD  mineral oil liquid Take 30 mLs by mouth daily as needed for moderate constipation.   Yes Historical Provider, MD  Multiple Vitamin (  MULTIVITAMIN WITH MINERALS) TABS tablet Take 1 tablet by mouth daily.   Yes Historical Provider, MD  triamcinolone cream (KENALOG) 0.1 % Apply 1 application topically 2 (two) times daily as needed (rash).    Yes  Historical Provider, MD  warfarin (COUMADIN) 5 MG tablet Take 5 mg by mouth as directed. Take 5mg s daily on Tuesday, Thursday, and Saturday and take 2.5mg s daily on all other days of the week   Yes Historical Provider, MD   Allergies  Allergen Reactions  . Nitroglycerin Anaphylaxis  . Penicillins Anaphylaxis, Hives and Shortness Of Breath    Has patient had a PCN reaction causing immediate rash, facial/tongue/throat swelling, SOB or lightheadedness with hypotension: Yes Has patient had a PCN reaction causing severe rash involving mucus membranes or skin necrosis: Yes Has patient had a PCN reaction that required hospitalization Yes Has patient had a PCN reaction occurring within the last 10 years: No If all of the above answers are "NO", then may proceed with Cephalosporin use.   . Sudafed  [Pseudoephedrine Hcl] Hives  . Tetracycline Shortness Of Breath    Other reaction(s): SHORTNESS OF BREATH  . Vancomycin Hives  . Doxycycline Rash    Other reaction(s): RASH  . Sulfa Antibiotics Rash    FAMILY HISTORY:  indicated that his mother is deceased. He indicated that his father is deceased.  SOCIAL HISTORY:  reports that he quit smoking about 37 years ago. His smoking use included Cigarettes. His smokeless tobacco use includes Chew. He reports that he does not drink alcohol or use illicit drugs.  REVIEW OF SYSTEMS:  Unable to obtain  SUBJECTIVE:   VITAL SIGNS: Temp:  [97.9 F (36.6 C)-98.2 F (36.8 C)] 98 F (36.7 C) (04/21 2031) Pulse Rate:  [103-128] 110 (04/21 2031) Resp:  [18-24] 22 (04/21 2031) BP: (122-160)/(69-97) 157/97 mmHg (04/21 2031) SpO2:  [89 %-97 %] 91 % (04/21 2031) Weight:  [77.973 kg (171 lb 14.4 oz)] 77.973 kg (171 lb 14.4 oz) (04/21 0522) HEMODYNAMICS:   VENTILATOR SETTINGS:   INTAKE / OUTPUT:  Intake/Output Summary (Last 24 hours) at 02/01/16 0111 Last data filed at 01/31/16 2259  Gross per 24 hour  Intake 10335.42 ml  Output   6200 ml  Net 4135.42 ml     PHYSICAL EXAMINATION: General:  Sedated, intubated Neuro:  Sedated HEENT:  ETT in place Cardiovascular:  Irregular, no m/r/g. JVD to 9cm Lungs:  Bilateral diffuse rales.  Equal BS b/l after intubation Abdomen:  Soft, non tender, non distended normal bowel sounds Musculoskeletal:  Normal bulk and tone Skin:  2+ pitting LE edema b/l  LABS:  CBC  Recent Labs Lab 01/29/16 0339 01/30/16 0402 01/31/16 0323  WBC 5.8 4.6 5.6  HGB 9.8* 8.3* 9.1*  HCT 28.1* 23.9* 26.5*  PLT 200 161 201   Coag's  Recent Labs Lab 01/29/16 0339 01/30/16 0402 01/31/16 0323  INR 1.85* 1.66* 1.56*   BMET  Recent Labs Lab 01/29/16 0339 01/30/16 0402 01/31/16 0337  NA 136 136 140  K 5.0 4.7 4.5  CL 105 113* 112*  CO2 20* 17* 19*  BUN 68* 57* 54*  CREATININE 4.48* 3.97* 3.99*  GLUCOSE 318* 222* 110*   Electrolytes  Recent Labs Lab 01/29/16 0339 01/30/16 0402 01/31/16 0337  CALCIUM 8.5* 8.7* 8.4*  PHOS 5.4* 5.1* 4.6   Sepsis Markers No results for input(s): LATICACIDVEN, PROCALCITON, O2SATVEN in the last 168 hours. ABG  Recent Labs Lab 02/01/16 0016  PHART 7.048*  PCO2ART 63.3*  PO2ART 88.2  Liver Enzymes  Recent Labs Lab 01/29/16 0339 01/30/16 0402 01/31/16 0337  ALBUMIN 3.0* 2.7* 3.0*   Cardiac Enzymes No results for input(s): TROPONINI, PROBNP in the last 168 hours. Glucose  Recent Labs Lab 01/30/16 1634 01/30/16 2203 01/31/16 0720 01/31/16 1119 01/31/16 1641 01/31/16 1907  GLUCAP 121* 134* 88 141* 132* 96    Imaging Dg Chest Port 1 View  01/31/2016  CLINICAL DATA:  81 y/o male with c/o SOB today, seemed to be in decent shape during exam. Hx a-fib, DM, HTN EXAM: PORTABLE CHEST 1 VIEW COMPARISON:  01/17/2016 FINDINGS: Heart size is obscured by dense opacity at the lung bases, right greater than left. Kerley B-lines are consistent with interstitial pulmonary edema. Suspect right pleural effusion. IMPRESSION: 1. Dense bilateral lower lobe infiltrates,  favoring infectious process. 2. Mild interstitial pulmonary edema also noted. Electronically Signed   By: Norva Pavlov M.D.   On: 01/31/2016 19:58     ASSESSMENT / PLAN: 81 yo male with h/o afib, that suffered an ATN with post ATN diuresis and developed volume overload and pulmonary edema likely from cardiac overload.   PULMONARY A: Acute hypoxic respiratory failure Cardiogenic pumonary edema P:   - intubated - ETT advanced by 4cm - gentle diuresis - ventilator settings appropriate - Daily breathing trials - Wean for O2 sat >88 - PPI - Oral hygiene   CARDIOVASCULAR A:  Afib- now in NSR Cardiogenic pulmonary edema  Reported history of CHF Elevated BNP P:  - euvolemic for tonight/autodiuresis - TTE tomorrow - EKG  - Lactate normal  RENAL A:   AKI Post ATN diuresis Mild HyperK NAGMA - likely 2/2 CKD/ renal failue -  P:   Euvolemia tonight - d/c lovenox  GASTROINTESTINAL A:   Not acitve P: PPI on vent     HEMATOLOGIC A:   Anemia of chronic disease P:  - monitor  INFECTIOUS A:   Not active P:     ENDOCRINE A:   Hyperglycemia   P:   - hyperglycmial  NEUROLOGIC A:   Sedated P:   RASS goal: 0 - Daily awakenings.d   SQH - d/c lovenox given GRN Tube feeds tomrrow. FC/FT  FAMILY at bedside, updated by me.   Total critical care time: 45 Guerra  Critical care time was exclusive of separately billable procedures and treating other patients.  Critical care was necessary to treat or prevent imminent or life-threatening deterioration.  Critical care was time spent personally by me on the following activities: development of treatment plan with patient and/or surrogate as well as nursing, discussions with consultants, evaluation of patient's response to treatment, examination of patient, obtaining history from patient or surrogate, ordering and performing treatments and interventions, ordering and review of laboratory studies, ordering and review  of radiographic studies, pulse oximetry and re-evaluation of patient's condition.   Galvin Proffer, DO., MS [facility]     Pulmonary and Critical Care Medicine [facility] Pager: [phone]  02/01/2016, 1:11 AM

## 2016-02-01 NOTE — Progress Notes (Signed)
CRITICAL VALUE ALERT  Critical value received:  Lactic Acid 2.4  Date of notification:  02/01/2016  Time of notification:  0900  Critical value read back:Yes.    Nurse who received alert:  Dennard NipBrittany Scott, RN  MD notified (1st page):  Dr. Christene Slatese Dios  Time of first page:  0905  MD notified (2nd page):  Time of second page:  Responding MD:  Dr. Christene Slatese Dios  Time MD responded:  [phone]

## 2016-02-01 NOTE — Progress Notes (Signed)
*  PRELIMINARY RESULTS* Echocardiogram 2D Echocardiogram has been performed.  Doristine SectionKristy H Alekzander Cardell 02/01/2016, 1:11 PM

## 2016-02-01 NOTE — Progress Notes (Signed)
eLink Physician-Brief Progress Note Patient Name: Paul LodgeMax B Guerra DOB: 03/22/1935 MRN: [id number]   Date of Service  02/01/2016  HPI/Events of Note  Hypertension  eICU Interventions  PRN labetalol ordered     Intervention Category Intermediate Interventions: Hypertension - evaluation and management  [name] 02/01/2016, 4:06 AM

## 2016-02-01 NOTE — Progress Notes (Signed)
PULMONARY / CRITICAL CARE MEDICINE   Name: JEREME LOREN MRN: [id number] DOB: [birth year]    ADMISSION DATE:  01/17/2016  CHIEF COMPLAINT:  Hypoxemic and hypercapnic respiratory failure  INITIAL PRESENTATION:  Patient is intubated and sedated, HPI taken from chart review. Transfer from [facility] on 01/17/16. Patient is a 81 year old male with history of pancreatitis, DM, HTN, recurrent UTI, recurrent kidney stones, atrial fibrillation, and colon cancer (right hemicolectomy). Presented to [facility] ED with elevated creatinine, probable UTI, and pneumonia. He has been having urinary frequency, dysuria, confusion, and suprapubic abdominal pain over the past month. He saw PCP 2 days prior to admission and was started on ciprofloxacin for UTI, but on his way home his PCP called to tell him to go to ED for IV fluids and antibiotics. Blood pressure was 97/51, CT showed hydronephrosis and kidney stones, and chest x-ray showed pneumonia. Acute renal failure not improving very well, thought to be secondary to ATN.   SIGNIFICANT EVENTS: On 01/30/2016 the patient had what appears to have been a post ATN diuresis with 6L output followed by 2L diuresis on 01/31/2016.  He was initiated on an aggressive volume resuscitation protocol that resulted in >4L within 12 hours.  He developed SOB and progressive respiratory distress.  He was moved to the ICU today and was found to have acute hypoxemic and hypercapnic respiratory failure.  He developed acute encephalopathy and was minimally responsive.  He was intubated emergently by CRNA.   PAST MEDICAL HISTORY :   has a past medical history of Recurrent nephrolithiasis (01/17/2016); Chronic a-fib (HCC) (01/17/2016); DM (diabetes mellitus) (HCC) (01/17/2016); BPH (benign prostatic hyperplasia) (01/17/2016); History of colon cancer (01/17/2016); Constipation (01/17/2016); HTN (hypertension), benign (01/17/2016); Chronic pancreatitis (HCC) (01/17/2016); Hyperlipidemia (01/17/2016); Cardiac arrest  (HCC) (01/17/2016); and Chewing tobacco use (01/17/2016).  has past surgical history that includes Cholecystectomy; Colon surgery; Cardiac catheterization; Transurethral resection of prostate; Hernia repair (Right, 09/03/2014); Lithotripsy; Eye surgery; and Cystoscopy w/ ureteral stent placement (Right, 01/20/2016). Prior to Admission medications   Medication Sig Start Date End Date Taking? Authorizing Provider  ALPRAZolam Prudy Feeler) 0.5 MG tablet Take 0.5 mg by mouth 3 (three) times daily as needed for anxiety or sleep.   Yes Historical Provider, MD  amitriptyline (ELAVIL) 25 MG tablet Take 25 mg by mouth at bedtime.   Yes Historical Provider, MD  amLODipine (NORVASC) 2.5 MG tablet Take 2.5 mg by mouth daily.   Yes Historical Provider, MD  atorvastatin (LIPITOR) 20 MG tablet Take 20 mg by mouth daily.   Yes Historical Provider, MD  dutasteride (AVODART) 0.5 MG capsule Take 0.5 mg by mouth daily.   Yes Historical Provider, MD  GARLIC PO Take 1 capsule by mouth daily.   Yes Historical Provider, MD  insulin glargine (LANTUS) 100 UNIT/ML injection Inject into the skin at bedtime. Inject 35 units daily increase by 2 units daily until fasting sugars are consistently below 160 Iyan 50 units per day   Yes Historical Provider, MD  insulin lispro (HUMALOG) 100 UNIT/ML injection Inject 7 Units into the skin 3 (three) times daily before meals.   Yes Historical Provider, MD  lisinopril (PRINIVIL,ZESTRIL) 20 MG tablet Take 20 mg by mouth daily.   Yes Historical Provider, MD  metoprolol (LOPRESSOR) 50 MG tablet Take 50 mg by mouth 2 (two) times daily.   Yes Historical Provider, MD  mineral oil liquid Take 30 mLs by mouth daily as needed for moderate constipation.   Yes Historical Provider, MD  Multiple Vitamin (  MULTIVITAMIN WITH MINERALS) TABS tablet Take 1 tablet by mouth daily.   Yes Historical Provider, MD  triamcinolone cream (KENALOG) 0.1 % Apply 1 application topically 2 (two) times daily as needed (rash).    Yes  Historical Provider, MD  warfarin (COUMADIN) 5 MG tablet Take 5 mg by mouth as directed. Take 5mg s daily on Tuesday, Thursday, and Saturday and take 2.5mg s daily on all other days of the week   Yes Historical Provider, MD   Allergies  Allergen Reactions  . Nitroglycerin Anaphylaxis  . Penicillins Anaphylaxis, Hives and Shortness Of Breath    Has patient had a PCN reaction causing immediate rash, facial/tongue/throat swelling, SOB or lightheadedness with hypotension: Yes Has patient had a PCN reaction causing severe rash involving mucus membranes or skin necrosis: Yes Has patient had a PCN reaction that required hospitalization Yes Has patient had a PCN reaction occurring within the last 10 years: No If all of the above answers are "NO", then may proceed with Cephalosporin use.   . Sudafed  [Pseudoephedrine Hcl] Hives  . Tetracycline Shortness Of Breath    Other reaction(s): SHORTNESS OF BREATH  . Vancomycin Hives  . Doxycycline Rash    Other reaction(s): RASH  . Sulfa Antibiotics Rash    FAMILY HISTORY:  indicated that his mother is deceased. He indicated that his father is deceased.  SOCIAL HISTORY:  reports that he quit smoking about 37 years ago. His smoking use included Cigarettes. His smokeless tobacco use includes Chew. He reports that he does not drink alcohol or use illicit drugs.  REVIEW OF SYSTEMS:  Unable to obtain  SUBJECTIVE:  No issues post intubation.   VITAL SIGNS: Temp:  [98 F (36.7 C)-99.1 F (37.3 C)] 99.1 F (37.3 C) (04/22 0800) Pulse Rate:  [69-128] 76 (04/22 0730) Resp:  [15-29] 25 (04/22 0730) BP: (110-221)/(57-128) 112/57 mmHg (04/22 0730) SpO2:  [89 %-99 %] 99 % (04/22 0732) FiO2 (%):  [40 %-100 %] 40 % (04/22 0732) Weight:  [184 lb 4.9 oz (83.6 kg)] 184 lb 4.9 oz (83.6 kg) (04/22 0430) HEMODYNAMICS:   VENTILATOR SETTINGS: Vent Mode:  [-] PRVC FiO2 (%):  [40 %-100 %] 40 % Set Rate:  [25 bmp] 25 bmp Vt Set:  [650 mL] 650 mL PEEP:  [5 cmH20]  5 cmH20 Plateau Pressure:  [20 cmH20-21 cmH20] 21 cmH20 INTAKE / OUTPUT:  Intake/Output Summary (Last 24 hours) at 02/01/16 0835 Last data filed at 02/01/16 0700  Gross per 24 hour  Intake 8428.34 ml  Output   4950 ml  Net 3478.34 ml    PHYSICAL EXAMINATION: General:  Sedated, intubated Neuro:  Sedated HEENT:  ETT in place Cardiovascular:  Irregular, no m/r/g. JVD to 9cm Lungs:  Bilateral diffuse rales.  Equal BS b/l after intubation Abdomen:  Soft, non tender, non distended normal bowel sounds Musculoskeletal:  Normal bulk and tone Skin:  2+ pitting LE edema b/l  LABS:  CBC  Recent Labs Lab 01/30/16 0402 01/31/16 0323 02/01/16 0333  WBC 4.6 5.6 8.9  HGB 8.3* 9.1* 10.3*  HCT 23.9* 26.5* 30.2*  PLT 161 201 241   Coag's  Recent Labs Lab 01/30/16 0402 01/31/16 0323 02/01/16 0333  INR 1.66* 1.56* 1.65*   BMET  Recent Labs Lab 01/31/16 0337 02/01/16 0024 02/01/16 0333  NA 140 144 143  K 4.5 5.2* 5.6*  CL 112* 118* 117*  CO2 19* 19* 16*  BUN 54* 50* 53*  CREATININE 3.99* 3.79* 3.89*  GLUCOSE 110* 167* 188*  Electrolytes  Recent Labs Lab 01/30/16 0402 01/31/16 0337 02/01/16 0024 02/01/16 0333  CALCIUM 8.7* 8.4* 8.1* 8.1*  PHOS 5.1* 4.6  --  6.1*   Sepsis Markers  Recent Labs Lab 02/01/16 0333  LATICACIDVEN 1.2   ABG  Recent Labs Lab 02/01/16 0016 02/01/16 0258  PHART 7.048* 7.232*  PCO2ART 63.3* 35.8  PO2ART 88.2 104*   Liver Enzymes  Recent Labs Lab 01/30/16 0402 01/31/16 0337 02/01/16 0333  ALBUMIN 2.7* 3.0* 3.0*   Cardiac Enzymes No results for input(s): TROPONINI, PROBNP in the last 168 hours. Glucose  Recent Labs Lab 01/31/16 1119 01/31/16 1641 01/31/16 1907 02/01/16 0015 02/01/16 0408 02/01/16 0746  GLUCAP 141* 132* 96 142* 190* 160*    Imaging Portable Chest Xray  02/01/2016  CLINICAL DATA:  Post intubation. EXAM: PORTABLE CHEST 1 VIEW COMPARISON:  01/31/2016 at 1940 hour FINDINGS: Endotracheal tube is  7.1 cm from the carina. Progressive bilateral interstitial opacities concerning for increased pulmonary edema. Bibasilar opacities may be confluent edema or infectious, right greater than left. Probable developing right pleural effusion. Cardiomediastinal contours are unchanged. Remote right rib fracture. IMPRESSION: 1. Endotracheal tube 7.1 cm from the carina. 2. Progressive pulmonary edema. 3. More confluent right greater than lung base opacities may be confluent vascular structures, pneumonia or aspiration. Probable developing right pleural effusion. Electronically Signed   By: Rubye Oaks M.D.   On: 02/01/2016 01:56   Dg Chest Port 1 View  01/31/2016  CLINICAL DATA:  81 y/o male with c/o SOB today, seemed to be in decent shape during exam. Hx a-fib, DM, HTN EXAM: PORTABLE CHEST 1 VIEW COMPARISON:  01/17/2016 FINDINGS: Heart size is obscured by dense opacity at the lung bases, right greater than left. Kerley B-lines are consistent with interstitial pulmonary edema. Suspect right pleural effusion. IMPRESSION: 1. Dense bilateral lower lobe infiltrates, favoring infectious process. 2. Mild interstitial pulmonary edema also noted. Electronically Signed   By: Norva Pavlov M.D.   On: 01/31/2016 19:58     ASSESSMENT / PLAN: 81 yo male with h/o afib, that suffered an ATN with post ATN diuresis and developed volume overload and pulmonary edema likely from cardiac overload.   PULMONARY A: Acute hypoxic respiratory failure Cardiogenic pumonary edema R/O HCAP P:   - intubated - gentle diuresis > lasix has been ordered. Will defer to renal re: diuresis - ventilator settings appropriate - Daily breathing trials - Wean for O2 sat >88 - PPI - Oral hygiene   CARDIOVASCULAR A:  Afib- now in NSR Cardiogenic pulmonary edema. R/O ACS/NSTEMI Reported history of CHF Elevated BNP P:  - diuresis per nephrology - trend troponin, check echo  RENAL A:   AKI Post ATN diuresis Mild HyperK NAGMA  - likely 2/2 CKD/ renal failue -  P:   - on diuretics. Will defer to renal service. - Hco3 + lasix for hyper K.   GASTROINTESTINAL A:   Not acitve P: PPI on vent   Start TF   HEMATOLOGIC A:   Anemia of chronic disease P:  - monitor  INFECTIOUS A:   Not active P:   Observe fever trend. Culture. Low threshold for starting abx.   ENDOCRINE A:   Hyperglycemia   P:   - hyperglycmial  NEUROLOGIC A:   Sedated P:   RASS goal: 0 - Daily awakenings.d    FAMILY no family at bedside.  Critical Care time with this pt today : 35   J. Alexis Frock, MD 02/01/2016, 8:46 AM  [facility] Pager [phone] After 3 pm or if no answer, call [phone]

## 2016-02-01 NOTE — Progress Notes (Signed)
eLink Physician-Brief Progress Note Patient Name: Paul LodgeMax B Guerra DOB: 1935-09-26 MRN: [id number]   Date of Service  02/01/2016  HPI/Events of Note  Patient with mixed resp/metabolic acidosis with pH 7.05/63/88/16.  Patient less responsive on 100% NRB.  eICU Interventions  Plan: Intubation Vent orders PRN sedation for now F/U ABG/PCXR     Intervention Category Major Interventions: Acid-Base disturbance - evaluation and management;Respiratory failure - evaluation and management  DETERDING,ELIZABETH 02/01/2016, 12:43 AM

## 2016-02-01 NOTE — Anesthesia Procedure Notes (Signed)
Procedure Name: Intubation Date/Time: 02/01/2016 1:05 AM Performed by: Doran ClayALDAY, Ebbie Cherry R Pre-anesthesia Checklist: Patient identified, Timeout performed, Emergency Drugs available, Suction available and Patient being monitored Patient Re-evaluated:Patient Re-evaluated prior to inductionPreoxygenation: Pre-oxygenation with 100% oxygen Intubation Type: IV induction Laryngoscope Size: Mac, Glidescope and 4 Grade View: Grade I Tube type: Oral Tube size: 7.5 mm Number of attempts: 1 Airway Equipment and Method: Stylet Placement Confirmation: ETT inserted through vocal cords under direct vision,  breath sounds checked- equal and bilateral and CO2 detector Secured at: 23 cm Tube secured with: Tape Dental Injury: Teeth and Oropharynx as per pre-operative assessment

## 2016-02-01 NOTE — Progress Notes (Addendum)
Patient started to have Dyspnea and wheezes again, RT called to give him breathing treatment but didn't work, Paged N.P. and Rapid response nurse to check patient, oncall nephrologies was paged too, N.P. Decided to bring him in ICU. Report was given with ICU RN.

## 2016-02-01 NOTE — Progress Notes (Signed)
ANTICOAGULATION CONSULT NOTE - Follow Up Consult  Pharmacy Consult for Warfarin/lovenox Indication: atrial fibrillation  Allergies  Allergen Reactions  . Nitroglycerin Anaphylaxis  . Penicillins Anaphylaxis, Hives and Shortness Of Breath    Has patient had a PCN reaction causing immediate rash, facial/tongue/throat swelling, SOB or lightheadedness with hypotension: Yes Has patient had a PCN reaction causing severe rash involving mucus membranes or skin necrosis: Yes Has patient had a PCN reaction that required hospitalization Yes Has patient had a PCN reaction occurring within the last 10 years: No If all of the above answers are "NO", then may proceed with Cephalosporin use.   . Sudafed  [Pseudoephedrine Hcl] Hives  . Tetracycline Shortness Of Breath    Other reaction(s): SHORTNESS OF BREATH  . Vancomycin Hives  . Doxycycline Rash    Other reaction(s): RASH  . Sulfa Antibiotics Rash    Patient Measurements: Height: 6\' 3"  (190.5 cm) Weight: 184 lb 4.9 oz (83.6 kg) IBW/kg (Calculated) : 84.5  Vital Signs: Temp: 99.1 F (37.3 C) (04/22 0800) Temp Source: Axillary (04/22 0800) BP: 112/57 mmHg (04/22 0730) Pulse Rate: 76 (04/22 0730)  Labs:  Recent Labs  01/30/16 0402 01/31/16 0323 01/31/16 0337 02/01/16 0024 02/01/16 0333  HGB 8.3* 9.1*  --   --  10.3*  HCT 23.9* 26.5*  --   --  30.2*  PLT 161 201  --   --  241  LABPROT 19.1* 18.1*  --   --  19.0*  INR 1.66* 1.56*  --   --  1.65*  CREATININE 3.97*  --  3.99* 3.79* 3.89*    Estimated Creatinine Clearance: 18.5 mL/min (by C-G formula based on Cr of 3.89).  Assessment: Patient is a 81 yo M with hx colon cancer and atrial fibrillation on warfarin PTA who is currently admitted for acute renal failure, CAP, and UTI. Pharmacy consulted to assist with dosing warfarin and bridging with lovenox while patient is in the hospital.  Home dose is 5mg  T/R/Sat and 2.5mg  all other days  Today, 02/01/2016 - INR sub-therapeutic  at 1.65, but up slightly from yesterday - Upon transferred to the ICU on 4/22, lovenox was d/ced by Dr. Ave FilterVerde.  Per Dr. Lucy Chrisios, no bleeding noted, ok to resume lovenox back for patient until INR is therapeutic - CBC stable - No bleeding documented - Procedure on 4/10: Cystoscopy, right retrograde ureteropyelogram, right double-J stent placement. No bleeding described in procedure note. - now NPO (started on 4/22)  Goal of Therapy:  INR 2-3 Monitor platelets by anticoagulation protocol: Yes  AntiXa level 0.6-1   Plan:  - Now that patient is NPO, this may make him more sensitive to warfarin.  Warfarin 5 mg x1 today (home dose) - Resume Lovenox 1mg /kg q24 for CrCl < 30 until INR therapeutic - Daily INR - monitor for s/s bleeding  Dorna LeitzAnh Ibtisam Benge, PharmD, BCPS 02/01/2016 10:04 AM

## 2016-02-01 NOTE — Progress Notes (Signed)
RRT called per Junior, RN d/t pt has dyspnea and wheezes.  Pt placed on NRB mask d/t sats declining.  Corrie DandyMary, NP made aware. Pt has AMS and diaphoretic B/P elvated.  Pt transferred to ICU to be stabilized.  CCM MD consulted per Corrie DandyMary, NP.

## 2016-02-01 NOTE — Progress Notes (Signed)
eLink Physician-Brief Progress Note Patient Name: Paul Guerra DOB: 1935-06-16 MRN: [id number]   Date of Service  02/01/2016  HPI/Events of Note  Patient with AMS and resp distress on NRB following adminstration of IVFs to greater than 6 liters for presumed ATN.  Now on NRB with RR of 30 and sats of 92%.  Also with AF with HR In the 130s and BP of 255/134 (178)  eICU Interventions  Plan: HL IVFs Lasix 160 mg IV times one May need to consider BiPAP/intubation  Checking ABG now Patient is a full code     Intervention Category Intermediate Interventions: Respiratory distress - evaluation and management;Oliguria - evaluation and management;Hypertension - evaluation and management;Arrhythmia - evaluation and management  Dietra Stokely 02/01/2016, 12:13 AM

## 2016-02-01 NOTE — Progress Notes (Signed)
eLink Physician-Brief Progress Note Patient Name: Paul LodgeMax B Guerra DOB: 09-18-1935 MRN: [id number]   Date of Service  02/01/2016  HPI/Events of Note  AFIB with RVR. Ventricular rate = 130. BP = 132/80.  eICU Interventions  Will order: 1. BMP and Mg++ level now.  2. Cardizem IV infusion. Titrate to HR < 105.     Intervention Category Major Interventions: Arrhythmia - evaluation and management  [name] 02/01/2016, 7:00 PM

## 2016-02-02 ENCOUNTER — Inpatient Hospital Stay (HOSPITAL_COMMUNITY): Payer: Medicare Other

## 2016-02-02 DIAGNOSIS — I482 Chronic atrial fibrillation: Secondary | ICD-10-CM

## 2016-02-02 DIAGNOSIS — E440 Moderate protein-calorie malnutrition: Secondary | ICD-10-CM | POA: Insufficient documentation

## 2016-02-02 DIAGNOSIS — N179 Acute kidney failure, unspecified: Secondary | ICD-10-CM

## 2016-02-02 DIAGNOSIS — J9601 Acute respiratory failure with hypoxia: Secondary | ICD-10-CM

## 2016-02-02 DIAGNOSIS — J810 Acute pulmonary edema: Secondary | ICD-10-CM

## 2016-02-02 LAB — RENAL FUNCTION PANEL
ANION GAP: 13 (ref 5–15)
Albumin: 2.7 g/dL — ABNORMAL LOW (ref 3.5–5.0)
BUN: 72 mg/dL — AB (ref 6–20)
CALCIUM: 8.5 mg/dL — AB (ref 8.9–10.3)
CO2: 19 mmol/L — AB (ref 22–32)
Chloride: 112 mmol/L — ABNORMAL HIGH (ref 101–111)
Creatinine, Ser: 4.67 mg/dL — ABNORMAL HIGH (ref 0.61–1.24)
GFR calc Af Amer: 13 mL/min — ABNORMAL LOW (ref >60)
GFR calc non Af Amer: 11 mL/min — ABNORMAL LOW (ref >60)
GLUCOSE: 230 mg/dL — AB (ref 65–99)
POTASSIUM: 4.4 mmol/L (ref 3.5–5.1)
Phosphorus: 6 mg/dL — ABNORMAL HIGH (ref 2.5–4.6)
SODIUM: 144 mmol/L (ref 135–145)

## 2016-02-02 LAB — GLUCOSE, CAPILLARY
GLUCOSE-CAPILLARY: 229 mg/dL — AB (ref 65–99)
GLUCOSE-CAPILLARY: 280 mg/dL — AB (ref 65–99)
Glucose-Capillary: 214 mg/dL — ABNORMAL HIGH (ref 65–99)
Glucose-Capillary: 223 mg/dL — ABNORMAL HIGH (ref 65–99)
Glucose-Capillary: 243 mg/dL — ABNORMAL HIGH (ref 65–99)
Glucose-Capillary: 272 mg/dL — ABNORMAL HIGH (ref 65–99)

## 2016-02-02 LAB — PROTIME-INR
INR: 2.13 — ABNORMAL HIGH (ref 0.00–1.49)
Prothrombin Time: 23 seconds — ABNORMAL HIGH (ref 11.6–15.2)

## 2016-02-02 LAB — TROPONIN I: Troponin I: 0.03 ng/mL (ref <0.031)

## 2016-02-02 MED ORDER — LACTATED RINGERS IV BOLUS (SEPSIS)
500.0000 mL | Freq: Once | INTRAVENOUS | Status: AC
  Administered 2016-02-02: 500 mL via INTRAVENOUS

## 2016-02-02 MED ORDER — METOLAZONE 10 MG PO TABS
10.0000 mg | ORAL_TABLET | Freq: Two times a day (BID) | ORAL | Status: DC
  Administered 2016-02-02 (×2): 10 mg via ORAL
  Filled 2016-02-02 (×4): qty 1

## 2016-02-02 MED ORDER — WARFARIN SODIUM 2.5 MG PO TABS
2.5000 mg | ORAL_TABLET | Freq: Once | ORAL | Status: AC
  Administered 2016-02-02: 2.5 mg via ORAL
  Filled 2016-02-02: qty 1

## 2016-02-02 MED ORDER — PRO-STAT SUGAR FREE PO LIQD
30.0000 mL | Freq: Two times a day (BID) | ORAL | Status: DC
  Administered 2016-02-02 – 2016-02-03 (×4): 30 mL
  Filled 2016-02-02 (×4): qty 30

## 2016-02-02 MED ORDER — VITAL 1.5 CAL PO LIQD
1000.0000 mL | ORAL | Status: DC
  Administered 2016-02-02: 1000 mL
  Filled 2016-02-02 (×3): qty 1000

## 2016-02-02 MED ORDER — SODIUM CHLORIDE 0.9 % IV BOLUS (SEPSIS)
1000.0000 mL | Freq: Once | INTRAVENOUS | Status: AC
  Administered 2016-02-02: 1000 mL via INTRAVENOUS

## 2016-02-02 MED ORDER — ALBUMIN HUMAN 5 % IV SOLN
12.5000 g | Freq: Once | INTRAVENOUS | Status: AC
  Administered 2016-02-02: 12.5 g via INTRAVENOUS
  Filled 2016-02-02: qty 250

## 2016-02-02 MED ORDER — PANTOPRAZOLE SODIUM 40 MG PO PACK
40.0000 mg | PACK | Freq: Every day | ORAL | Status: DC
  Administered 2016-02-02: 40 mg
  Filled 2016-02-02 (×2): qty 20

## 2016-02-02 NOTE — Progress Notes (Signed)
ANTICOAGULATION CONSULT NOTE - Follow Up Consult  Pharmacy Consult for Warfarin/lovenox Indication: atrial fibrillation  Allergies  Allergen Reactions  . Nitroglycerin Anaphylaxis  . Penicillins Anaphylaxis, Hives and Shortness Of Breath    Has patient had a PCN reaction causing immediate rash, facial/tongue/throat swelling, SOB or lightheadedness with hypotension: Yes Has patient had a PCN reaction causing severe rash involving mucus membranes or skin necrosis: Yes Has patient had a PCN reaction that required hospitalization Yes Has patient had a PCN reaction occurring within the last 10 years: No If all of the above answers are "NO", then may proceed with Cephalosporin use.   . Sudafed  [Pseudoephedrine Hcl] Hives  . Tetracycline Shortness Of Breath    Other reaction(s): SHORTNESS OF BREATH  . Vancomycin Hives  . Doxycycline Rash    Other reaction(s): RASH  . Sulfa Antibiotics Rash    Patient Measurements: Height: 6\' 3"  (190.5 cm) Weight: 184 lb 4.9 oz (83.6 kg) IBW/kg (Calculated) : 84.5  Vital Signs: Temp: 98.9 F (37.2 C) (04/23 0400) Temp Source: Axillary (04/23 0400) BP: 87/50 mmHg (04/23 0615) Pulse Rate: 82 (04/23 0615)  Labs:  Recent Labs  01/31/16 0323  02/01/16 0333 02/01/16 1949 02/02/16 0336  HGB 9.1*  --  10.3*  --   --   HCT 26.5*  --  30.2*  --   --   PLT 201  --  241  --   --   LABPROT 18.1*  --  19.0*  --  23.0*  INR 1.56*  --  1.65*  --  2.13*  CREATININE  --   < > 3.89* 4.36* 4.67*  < > = values in this interval not displayed.  Estimated Creatinine Clearance: 15.4 mL/min (by C-G formula based on Cr of 4.67).  Assessment: Patient is a 81 yo M with hx colon cancer and atrial fibrillation on warfarin PTA who is currently admitted for acute renal failure, CAP, and UTI. Pharmacy consulted to assist with dosing warfarin and bridging with lovenox while patient is in the hospital.  Home dose is 5mg  T/R/Sat and 2.5mg  all other days  Significant  events: 4/10: Cystoscopy, right retrograde ureteropyelogram, right double-J stent placement. No bleeding described in procedure note. 4/22: transferred to ICU   Today, 02/02/2016 - INR now therapeutic at 2.13, but increased noticeably from 1.65 yesterday (renal failure can contribute to patient being more sensitive to warfarin-- will plan on being conservative with warfarin dosing) - CBC stable on 4/22 - No bleeding documented - Diet: TF  Goal of Therapy:  INR 2-3 Monitor platelets by anticoagulation protocol: Yes  AntiXa level 0.6-1   Plan:  - warfarin 2.5 mg PO x1 (home dose) - Lovenox 80 mg q24 for CrCl < 30 --> consider d/c Lovenox now that  INR is therapeutic - Daily INR - monitor for s/s bleeding  Dorna LeitzAnh Damaria Vachon, PharmD, BCPS 02/02/2016 7:34 AM

## 2016-02-02 NOTE — Progress Notes (Signed)
Initial Nutrition Assessment  DOCUMENTATION CODES:   Non-severe (moderate) malnutrition in context of acute illness/injury  INTERVENTION:   -D/c Vital HP -Initiate Vital 1.5 @ 40 ml/hr via OGT and increase by 10 ml every 4 hours to goal rate of 50 ml/hr.  -30 ml Prostat BID.   -Tube feeding regimen provides 2000 kcal (96% of needs), 111 grams of protein, and 917 ml of H2O.   RD to continue to monitor  NUTRITION DIAGNOSIS:   Inadequate oral intake related to inability to eat as evidenced by NPO status.  GOAL:   Patient will meet greater than or equal to 90% of their needs  MONITOR:   Vent status, Labs, Weight trends, TF tolerance, I & O's  REASON FOR ASSESSMENT:   Consult, Ventilator Enteral/tube feeding initiation and management  ASSESSMENT:    81 y.o. male patient with history of chronic pancreatitis, diabetes mellitus, BPH, chronic anemia, remote history of colon cancer status post right hemicolectomy, hypertension, hyperlipidemia, history of recurrent UTIs, history of recurrent kidney stones, history of atrial fibrillation on chronic anticoagulation with Coumadin who [facility] with a elevated creatinine 4 baseline approximately 1.1 and probable UTI and pneumonia. CT stone study protocol which was done showed a right hydronephrosis and concern for obstruction. Physician [facility] spoke with Dr. Thad Rangereynolds urology who accepted the patient in consult. It was noted that patient also had an episode of hypoglycemia which has since resolved. INR there was 4.2. Patient was also noted to have a metabolic acidosis felt to be secondary to acute renal failure.  Patient sedated in room with no family present. During this admission, pt has been consuming 100% of his meals. Pt has been started on TF, currently receiving Vital HP @ 40 ml/hr (provides 960 kcal and 84 g protein). Will adjust TF order to better meet estimated needs.  Patient is  currently intubated on ventilator support d/t respiratory distress after fluid resuscitation. MV: 15.8 L/min Temp (24hrs), Avg:98.6 F (37 C), Min:97.6 F (36.4 C), Kirtis:99.7 F (37.6 C)  Medications: IV Lasix every 6 hours Labs reviewed: CBGs: 198-229 Elevated Phos Mg WNL  Diet Order:  Diet NPO time specified  Skin:  Wound (see comment) (closed perinieal incision)  Last BM:  PTA  Height:   Ht Readings from Last 1 Encounters:  02/02/16 6\' 3"  (1.905 m)    Weight:   Wt Readings from Last 1 Encounters:  02/02/16 185 lb 6.5 oz (84.1 kg)    Ideal Body Weight:  89.1 kg  BMI:  Body mass index is 23.17 kg/(m^2).  Estimated Nutritional Needs:   Kcal:  2083  Protein:  110-120g  Fluid:  2L/day  EDUCATION NEEDS:   No education needs identified at this time  Tilda FrancoLindsey Myliah Medel, MS, RD, LDN Pager: [phone] After Hours Pager: [phone]

## 2016-02-02 NOTE — Progress Notes (Signed)
eLink Physician-Brief Progress Note Patient Name: Dorann LodgeMax B Bueno DOB: October 03, 1935 MRN: [id number]   Date of Service  02/02/2016  HPI/Events of Note  Hypotension - BP = 86/37. Albumin = 2.7.  eICU Interventions  Will order:  1. Will order 5% albumin 12.5 gm IV now.  2. Hold Lasix dose for SBP < 100.     Intervention Category Major Interventions: Hypotension - evaluation and management  Petrice Beedy Eugene 02/02/2016, 3:22 PM

## 2016-02-02 NOTE — Progress Notes (Signed)
Subjective: Interval History: BP's lower today, no CXR. Creat 4.6.  2.8 L UOP yest on IV lasix.   Objective: Vital signs in last 24 hours: Temp:  [98.1 F (36.7 C)-99.7 F (37.6 C)] 99.5 F (37.5 C) (04/23 2100) Pulse Rate:  [34-169] 110 (04/23 2002) Resp:  [22-25] 25 (04/23 2002) BP: (71-138)/(26-85) 93/61 mmHg (04/23 2002) SpO2:  [76 %-100 %] 96 % (04/23 2002) FiO2 (%):  [30 %] 30 % (04/23 2002) Weight:  [84.1 kg (185 lb 6.5 oz)] 84.1 kg (185 lb 6.5 oz) (04/23 0800) Weight change:   Intake/Output from previous day: 04/22 0701 - 04/23 0700 In: 1664.7 [I.V.:458.3; NG/GT:548.3; IV Piggyback:198] Out: 2875 [Urine:2875] Intake/Output this shift:    Gen: on vent, sedated Resp: clear ant and lat Cor: RRR holosystolic M 2/6, blowing at apex GI: soft, non-tender; bowel sounds normal; no masses,  no organomegaly Ext: 1-2+ depend edema bilat  Lab Results:  Recent Labs  01/31/16 0323 02/01/16 0333  WBC 5.6 8.9  HGB 9.1* 10.3*  HCT 26.5* 30.2*  PLT 201 241   BMET:   Recent Labs  02/01/16 1949 02/02/16 0336  NA 145 144  K 4.5 4.4  CL 114* 112*  CO2 18* 19*  GLUCOSE 200* 230*  BUN 66* 72*  CREATININE 4.36* 4.67*  CALCIUM 8.5* 8.5*   No results for input(s): PTH in the last 72 hours. Iron Studies: No results for input(s): IRON, TIBC, TRANSFERRIN, FERRITIN in the last 72 hours.  Studies/Results: Dg Chest 1 View  02/02/2016  CLINICAL DATA:  Pulmonary edema EXAM: CHEST 1 VIEW COMPARISON:  02/01/2016 FINDINGS: Endotracheal tube terminates 5.5 cm above the carina. Improving interstitial edema. Layering small bilateral pleural effusions. No pneumothorax. The heart is normal in size. Enteric tube courses below the diaphragm. IMPRESSION: Endotracheal tube terminates 5.5 cm above the carina. Improving interstitial edema. Layering small bilateral pleural effusions. Electronically Signed   By: Charline BillsSriyesh  Krishnan M.D.   On: 02/02/2016 11:12   Dg Abd 1 View  02/01/2016  CLINICAL  DATA:  Evaluate orogastric tube placement. Intubated patient. Acute renal failure. EXAM: ABDOMEN - 1 VIEW COMPARISON:  CT, 01/16/2016 FINDINGS: Oral/ nasogastric tube tip lies within the mid stomach. There are multiple densities in the visualized abdomen consistent with flecks if of residual barium. No evidence of bowel obstruction. IMPRESSION: Oral/nasogastric tube tip lies within the mid stomach. Electronically Signed   By: Amie Portlandavid  Ormond M.D.   On: 02/01/2016 10:46   Portable Chest Xray  02/01/2016  CLINICAL DATA:  Post intubation. EXAM: PORTABLE CHEST 1 VIEW COMPARISON:  01/31/2016 at 1940 hour FINDINGS: Endotracheal tube is 7.1 cm from the carina. Progressive bilateral interstitial opacities concerning for increased pulmonary edema. Bibasilar opacities may be confluent edema or infectious, right greater than left. Probable developing right pleural effusion. Cardiomediastinal contours are unchanged. Remote right rib fracture. IMPRESSION: 1. Endotracheal tube 7.1 cm from the carina. 2. Progressive pulmonary edema. 3. More confluent right greater than lung base opacities may be confluent vascular structures, pneumonia or aspiration. Probable developing right pleural effusion. Electronically Signed   By: Rubye OaksMelanie  Ehinger M.D.   On: 02/01/2016 01:56   History: HTN >5359yr, BPH post TURP, colon CA s/p resx, cardiac arrest in past, recurrent renal stones with lithotripsy x 3 at least, recurrent UTIs, Afib, DM, [facility] MD with Cr of 4 . Last available was 1.12 from 7/16. Serial Cr here 3.7 to 3.9 to 4.1 and then 4.6. Had pyuria,  R hydronephrosis and ? pneu on eval. Tx here with ivf with bicarb, R double J stent placed 4/10. Cr still rising. High Fena, has been on Fluconazole, Cipro, levaquin. Was on Norvasc, lopressor, Lisinopril at home. Low bp on presentation to [facility].  4/7 Urine cx + > 100k yeast 4/7 UA - turbic, tntc WBC and RBC, 30 prot, many bact 4/11 UA -  cloudy, no bact/ tntc WBC / tntc RBC, 30 prot 4/22 UA - clear rare bact, mod Hb, neg prot, 6-30 rbc/ wbc, yeast +, 1.008   Assessment/Plan: 1 AKI - not recovering function. Making urine 2 Resp failure/ pulm edema on vent 3 R hydro sp stent 4 Yeast UTI sp rx 5 DM controlled 6 Hx renal stones  P - cont diuresis if BP tolerates. May need RRT soon if not improving. CXR am. DC"d PPI.    LOS: 16 days   Paul Guerra D 02/02/2016,10:00 PM

## 2016-02-02 NOTE — Progress Notes (Signed)
PULMONARY / CRITICAL CARE MEDICINE   Name: Paul Guerra MRN: [id number] DOB: 09-Apr-1935    ADMISSION DATE:  01/17/2016  CHIEF COMPLAINT:  Hypoxemic and hypercapnic respiratory failure  INITIAL PRESENTATION:  Patient is intubated and sedated, HPI taken from chart review. [facility]. Patient is a 81 year old male with history of pancreatitis, DM, HTN, recurrent UTI, recurrent kidney stones, atrial fibrillation, and colon cancer (right hemicolectomy). [facility] with elevated creatinine, probable UTI, and pneumonia. He has been having urinary frequency, dysuria, confusion, and suprapubic abdominal pain over the past month. He saw PCP 2 days prior to admission and was started on ciprofloxacin for UTI, but on his way home his PCP called to tell him to go to ED for IV fluids and antibiotics. Blood pressure was 97/51, CT showed hydronephrosis and kidney stones, and chest x-ray showed pneumonia. Acute renal failure not improving very well, thought to be secondary to ATN.   SIGNIFICANT EVENTS: 4/7 admitted for abd pain/UTI/AKI 4/22 went into resp distress after fluid resuscitation. Intubated. PCCM consulted   SUBJECTIVE:  Went into rapid afib last night > cardizem drip diurescing well   VITAL SIGNS: Temp:  [97.6 F (36.4 C)-99.7 F (37.6 C)] 98.9 F (37.2 C) (04/23 0400) Pulse Rate:  [34-145] 110 (04/23 0800) Resp:  [25] 25 (04/23 0800) BP: (83-169)/(40-107) 87/40 mmHg (04/23 0700) SpO2:  [76 %-100 %] 94 % (04/23 0800) FiO2 (%):  [30 %-40 %] 30 % (04/23 0301) Weight:  [185 lb 6.5 oz (84.1 kg)] 185 lb 6.5 oz (84.1 kg) (04/23 0800) HEMODYNAMICS:   VENTILATOR SETTINGS: Vent Mode:  [-] PRVC FiO2 (%):  [30 %-40 %] 30 % Set Rate:  [25 bmp] 25 bmp Vt Set:  [650 mL] 650 mL PEEP:  [5 cmH20] 5 cmH20 Plateau Pressure:  [17 cmH20-20 cmH20] 19 cmH20 INTAKE / OUTPUT:  Intake/Output Summary (Last 24 hours) at 02/02/16 [id number] Last data filed at 02/02/16  0600  Gross per 24 hour  Intake 1620.51 ml  Output   2875 ml  Net -1254.49 ml    PHYSICAL EXAMINATION: General:  Sedated, intubated Neuro:  Sedated HEENT:  ETT in place  (-) NVD Cardiovascular:  Irregular, no m/r/g.  Lungs:  Bibasilar crackles Abdomen:  Soft, non tender, non distended normal bowel sounds Musculoskeletal:  Normal bulk and tone Skin:  2+ pitting LE edema b/l  LABS:  CBC  Recent Labs Lab 01/30/16 0402 01/31/16 0323 02/01/16 0333  WBC 4.6 5.6 8.9  HGB 8.3* 9.1* 10.3*  HCT 23.9* 26.5* 30.2*  PLT 161 201 241   Coag's  Recent Labs Lab 01/31/16 0323 02/01/16 0333 02/02/16 0336  INR 1.56* 1.65* 2.13*   BMET  Recent Labs Lab 02/01/16 0333 02/01/16 1949 02/02/16 0336  NA 143 145 144  K 5.6* 4.5 4.4  CL 117* 114* 112*  CO2 16* 18* 19*  BUN 53* 66* 72*  CREATININE 3.89* 4.36* 4.67*  GLUCOSE 188* 200* 230*   Electrolytes  Recent Labs Lab 01/31/16 0337  02/01/16 0333 02/01/16 1949 02/02/16 0336  CALCIUM 8.4*  < > 8.1* 8.5* 8.5*  MG  --   --   --  1.7  --   PHOS 4.6  --  6.1*  --  6.0*  < > = values in this interval not displayed. Sepsis Markers  Recent Labs Lab 02/01/16 0333 02/01/16 0820  LATICACIDVEN 1.2 2.4*   ABG  Recent Labs Lab 02/01/16 0016 02/01/16 0258  PHART 7.048*  7.232*  PCO2ART 63.3* 35.8  PO2ART 88.2 104*   Liver Enzymes  Recent Labs Lab 01/31/16 0337 02/01/16 0333 02/02/16 0336  ALBUMIN 3.0* 3.0* 2.7*   Cardiac Enzymes No results for input(s): TROPONINI, PROBNP in the last 168 hours. Glucose  Recent Labs Lab 02/01/16 0408 02/01/16 0746 02/01/16 1209 02/01/16 1559 02/01/16 1946 02/01/16 2353  GLUCAP 190* 160* 180* 147* 173* 198*    Imaging Dg Abd 1 View  02/01/2016  CLINICAL DATA:  Evaluate orogastric tube placement. Intubated patient. Acute renal failure. EXAM: ABDOMEN - 1 VIEW COMPARISON:  CT, 01/16/2016 FINDINGS: Oral/ nasogastric tube tip lies within the mid stomach. There are multiple  densities in the visualized abdomen consistent with flecks if of residual barium. No evidence of bowel obstruction. IMPRESSION: Oral/nasogastric tube tip lies within the mid stomach. Electronically Signed   By: Amie Portland M.D.   On: 02/01/2016 10:46     ASSESSMENT / PLAN: 81 yo male with h/o afib, that suffered an ATN with post ATN diuresis and developed volume overload and pulmonary edema likely from cardiac overload.   PULMONARY A: Acute hypoxic respiratory failure 2/2 Cardiogenic pumonary edema R/O HCAP P:   - intubated - cont diuresis per renal > on lasix and metolazone - ventilator settings appropriate - Daily breathing trials > start weaning today.  If CXR is better, anticipate, may extubate in 1-2 days if he continues  to improve - Wean for O2 sat >88 - PPI - Oral hygiene   CARDIOVASCULAR A:  Afib- now in CVR, was in RVR overnight Cardiogenic pulmonary edema. R/O ACS/NSTEMI Reported history of CHF Elevated BNP P:  - diuresis per nephrology - trend troponin > ordered 4/22 but no results seen. Will order one today - f/u echo.   RENAL A:   AKI Post ATN diuresis Metabolic acidosis -  P:   - on diuretics   GASTROINTESTINAL A:   No active issues P: PPI on vent   Cont TF   HEMATOLOGIC A:   Anemia of chronic disease  P:  - monitor - on coumadin lovenox overlap. INR is therapeutic. Will d/c lovenox. Pharmacy taking care of coumadin dosing,   INFECTIOUS A:   No active issues  P:   Observe fever trend. F/u Culture. Low threshold for starting abx.   ENDOCRINE A:   Hyperglycemia   P:   - CBG q 4. Keep CBG < 180 mg%  NEUROLOGIC A:   Sedated P:   RASS goal: 0 - Daily awakenings. On fentanyl drip   FAMILY no family at bedside.  Critical Care time with this pt today : 30  J. Alexis Frock, MD 02/02/2016, 8:28 AM [place] [facility] Pager [phone] After 3 pm or if no answer, call [phone]

## 2016-02-02 NOTE — Progress Notes (Signed)
eLink Physician-Brief Progress Note Patient Name: Paul Guerra DOB: 20-Sep-1935 MRN: [id number]   Date of Service  02/02/2016  HPI/Events of Note  Notified by bedside nurse the patient's blood pressure still marginal with systolic blood pressure 100 & mean arterial pressure 57. Heart rate ranging from 110s to 150s. Currently saturating 100% on FiO2 0.3 on ventilator. Cardizem drip held given earlier hypotension & also given 1 L normal saline bolus.Potassium level 4.4 at 0336 hours this morning.  eICU Interventions  1. LR 500 mL bolus IV over 1 hour 2. Stat magnesium & BMP 3. Continue to monitor on telemetry 4. Continue to hold IV diltiazem     Intervention Category Major Interventions: Arrhythmia - evaluation and management  Lawanda CousinsJennings Detrich Rakestraw 02/02/2016, 11:45 PM

## 2016-02-02 NOTE — Progress Notes (Signed)
eLink Physician-Brief Progress Note Patient Name: Paul LodgeMax B Guerra DOB: 20-Nov-1934 MRN: [id number]   Date of Service  02/02/2016  HPI/Events of Note  Hypotension - BP = 73/42. Patient is on a Cardizem IV infusion. Note that patient is allergic to Neosynephrine >> hives and EPIC flashes warning for both Phenylephrine and Norepinephrine IV infusions.   eICU Interventions  Will order: 1. Hold Cardizem IV infusion. 2. 0.9 NaCl 1 liter IV over 1 hour now.      Intervention Category Major Interventions: Hypotension - evaluation and management  Jaques Mineer Eugene 02/02/2016, 6:11 PM

## 2016-02-03 ENCOUNTER — Inpatient Hospital Stay (HOSPITAL_COMMUNITY): Payer: Medicare Other

## 2016-02-03 LAB — BASIC METABOLIC PANEL
ANION GAP: 12 (ref 5–15)
BUN: 91 mg/dL — ABNORMAL HIGH (ref 6–20)
CALCIUM: 8.3 mg/dL — AB (ref 8.9–10.3)
CO2: 20 mmol/L — AB (ref 22–32)
Chloride: 112 mmol/L — ABNORMAL HIGH (ref 101–111)
Creatinine, Ser: 5.13 mg/dL — ABNORMAL HIGH (ref 0.61–1.24)
GFR calc non Af Amer: 10 mL/min — ABNORMAL LOW (ref >60)
GFR, EST AFRICAN AMERICAN: 11 mL/min — AB (ref >60)
Glucose, Bld: 322 mg/dL — ABNORMAL HIGH (ref 65–99)
POTASSIUM: 4.2 mmol/L (ref 3.5–5.1)
Sodium: 144 mmol/L (ref 135–145)

## 2016-02-03 LAB — GLUCOSE, CAPILLARY
GLUCOSE-CAPILLARY: 228 mg/dL — AB (ref 65–99)
GLUCOSE-CAPILLARY: 249 mg/dL — AB (ref 65–99)
GLUCOSE-CAPILLARY: 279 mg/dL — AB (ref 65–99)
GLUCOSE-CAPILLARY: 296 mg/dL — AB (ref 65–99)
GLUCOSE-CAPILLARY: 299 mg/dL — AB (ref 65–99)
Glucose-Capillary: 217 mg/dL — ABNORMAL HIGH (ref 65–99)

## 2016-02-03 LAB — BLOOD GAS, ARTERIAL
ACID-BASE DEFICIT: 12 mmol/L — AB (ref 0.0–2.0)
Acid-base deficit: 15.3 mmol/L — ABNORMAL HIGH (ref 0.0–2.0)
Bicarbonate: 14.5 mEq/L — ABNORMAL LOW (ref 20.0–24.0)
Bicarbonate: 16.6 mEq/L — ABNORMAL LOW (ref 20.0–24.0)
DRAWN BY: 245131
Drawn by: 24513
FIO2: 0.4
FIO2: 1
MECHVT: 650 mL
O2 Saturation: 90.1 %
O2 Saturation: 95.7 %
PEEP/CPAP: 5 cmH2O
PO2 ART: 104 mmHg — AB (ref 80.0–100.0)
Patient temperature: 98.6
Patient temperature: 98.6
RATE: 24 resp/min
TCO2: 13.2 mmol/L (ref 0–100)
TCO2: 16.1 mmol/L (ref 0–100)
pCO2 arterial: 35.8 mmHg (ref 35.0–45.0)
pCO2 arterial: 63.3 mmHg (ref 35.0–45.0)
pH, Arterial: 7.048 — CL (ref 7.350–7.450)
pH, Arterial: 7.232 — ABNORMAL LOW (ref 7.350–7.450)
pO2, Arterial: 88.2 mmHg (ref 80.0–100.0)

## 2016-02-03 LAB — RENAL FUNCTION PANEL
ANION GAP: 14 (ref 5–15)
Albumin: 2.8 g/dL — ABNORMAL LOW (ref 3.5–5.0)
BUN: 93 mg/dL — ABNORMAL HIGH (ref 6–20)
CHLORIDE: 111 mmol/L (ref 101–111)
CO2: 20 mmol/L — AB (ref 22–32)
Calcium: 8.2 mg/dL — ABNORMAL LOW (ref 8.9–10.3)
Creatinine, Ser: 5.24 mg/dL — ABNORMAL HIGH (ref 0.61–1.24)
GFR calc non Af Amer: 9 mL/min — ABNORMAL LOW (ref >60)
GFR, EST AFRICAN AMERICAN: 11 mL/min — AB (ref >60)
GLUCOSE: 273 mg/dL — AB (ref 65–99)
POTASSIUM: 4 mmol/L (ref 3.5–5.1)
Phosphorus: 5.6 mg/dL — ABNORMAL HIGH (ref 2.5–4.6)
Sodium: 145 mmol/L (ref 135–145)

## 2016-02-03 LAB — PROTIME-INR
INR: 2.79 — ABNORMAL HIGH (ref 0.00–1.49)
Prothrombin Time: 28.1 seconds — ABNORMAL HIGH (ref 11.6–15.2)

## 2016-02-03 LAB — MAGNESIUM: Magnesium: 1.7 mg/dL (ref 1.7–2.4)

## 2016-02-03 MED ORDER — INSULIN ASPART 100 UNIT/ML ~~LOC~~ SOLN
0.0000 [IU] | SUBCUTANEOUS | Status: DC
  Administered 2016-02-03 (×2): 5 [IU] via SUBCUTANEOUS
  Administered 2016-02-03: 8 [IU] via SUBCUTANEOUS
  Administered 2016-02-03: 5 [IU] via SUBCUTANEOUS
  Administered 2016-02-03 – 2016-02-04 (×4): 8 [IU] via SUBCUTANEOUS
  Administered 2016-02-04 (×2): 3 [IU] via SUBCUTANEOUS
  Administered 2016-02-04 (×2): 5 [IU] via SUBCUTANEOUS
  Administered 2016-02-05: 3 [IU] via SUBCUTANEOUS
  Administered 2016-02-05: 5 [IU] via SUBCUTANEOUS
  Administered 2016-02-05 (×2): 3 [IU] via SUBCUTANEOUS
  Administered 2016-02-05: 5 [IU] via SUBCUTANEOUS
  Administered 2016-02-05: 3 [IU] via SUBCUTANEOUS
  Administered 2016-02-05: 5 [IU] via SUBCUTANEOUS
  Administered 2016-02-06: 2 [IU] via SUBCUTANEOUS
  Administered 2016-02-06 (×2): 3 [IU] via SUBCUTANEOUS
  Administered 2016-02-06: 4 [IU] via SUBCUTANEOUS
  Administered 2016-02-06: 8 [IU] via SUBCUTANEOUS
  Administered 2016-02-06 – 2016-02-07 (×3): 3 [IU] via SUBCUTANEOUS

## 2016-02-03 MED ORDER — FENTANYL CITRATE (PF) 100 MCG/2ML IJ SOLN
12.5000 ug | INTRAMUSCULAR | Status: DC | PRN
  Administered 2016-02-04: 25 ug via INTRAVENOUS
  Filled 2016-02-03: qty 2

## 2016-02-03 MED ORDER — AMIODARONE HCL IN DEXTROSE 360-4.14 MG/200ML-% IV SOLN
60.0000 mg/h | INTRAVENOUS | Status: AC
  Administered 2016-02-03 (×2): 60 mg/h via INTRAVENOUS
  Filled 2016-02-03 (×2): qty 200

## 2016-02-03 MED ORDER — RANITIDINE HCL 150 MG/10ML PO SYRP
150.0000 mg | ORAL_SOLUTION | Freq: Every day | ORAL | Status: DC
  Administered 2016-02-03: 150 mg via ORAL
  Filled 2016-02-03 (×2): qty 10

## 2016-02-03 MED ORDER — WARFARIN SODIUM 1 MG PO TABS
1.0000 mg | ORAL_TABLET | Freq: Once | ORAL | Status: AC
  Administered 2016-02-03: 1 mg via NASOGASTRIC
  Filled 2016-02-03: qty 1

## 2016-02-03 MED ORDER — INSULIN ASPART 100 UNIT/ML ~~LOC~~ SOLN
4.0000 [IU] | SUBCUTANEOUS | Status: DC
  Administered 2016-02-03 – 2016-02-04 (×7): 4 [IU] via SUBCUTANEOUS

## 2016-02-03 MED ORDER — AMIODARONE HCL IN DEXTROSE 360-4.14 MG/200ML-% IV SOLN
30.0000 mg/h | INTRAVENOUS | Status: DC
  Administered 2016-02-03 – 2016-02-04 (×5): 30 mg/h via INTRAVENOUS
  Filled 2016-02-03 (×4): qty 200

## 2016-02-03 MED ORDER — MAGNESIUM SULFATE IN D5W 10-5 MG/ML-% IV SOLN
1.0000 g | Freq: Once | INTRAVENOUS | Status: AC
  Administered 2016-02-03: 1 g via INTRAVENOUS
  Filled 2016-02-03: qty 100

## 2016-02-03 MED ORDER — FAMOTIDINE 40 MG/5ML PO SUSR: 20.0000 mg | Freq: Every day | ORAL | Status: DC

## 2016-02-03 NOTE — Progress Notes (Signed)
PULMONARY / CRITICAL CARE MEDICINE   Name: Paul Guerra MRN: [id number] DOB: 05/22/1935    ADMISSION DATE:  01/17/2016  CHIEF COMPLAINT:  Hypoxemic and hypercapnic respiratory failure  INITIAL PRESENTATION:  Patient is intubated and sedated, HPI taken from chart review. [facility]17/[facility]. Patient is a 81 year old male with history of pancreatitis, DM, HTN, recurrent UTI, recurrent kidney stones, atrial fibrillation, and colon cancer (right hemicolectomy). [facility] with elevated creatinine, probable UTI, and pneumonia. He has been having urinary frequency, dysuria, confusion, and suprapubic abdominal pain over the past month. He saw PCP 2 days prior to admission and was started on ciprofloxacin for UTI, but on his way home his PCP called to tell him to go to ED for IV fluids and antibiotics. Blood pressure was 97/51, CT showed hydronephrosis and kidney stones, and chest x-ray showed pneumonia. Acute renal failure not improving very well, thought to be secondary to ATN.   SIGNIFICANT EVENTS: 4/7 admitted for abd pain/UTI/AKI 4/22 went into resp distress after fluid resuscitation. Intubated. PCCM consulted   SUBJECTIVE:  Sedated  VITAL SIGNS: Temp:  [98.1 F (36.7 C)-100.1 F (37.8 C)] 98.2 F (36.8 C) (04/24 0739) Pulse Rate:  [42-169] 128 (04/24 0833) Resp:  [22-25] 25 (04/24 0833) BP: (71-128)/(24-114) 128/114 mmHg (04/24 0833) SpO2:  [87 %-100 %] 98 % (04/24 0833) FiO2 (%):  [30 %] 30 % (04/24 0833) Weight:  [182 lb 1.6 oz (82.6 kg)] 182 lb 1.6 oz (82.6 kg) (04/24 0500) HEMODYNAMICS:   VENTILATOR SETTINGS: Vent Mode:  [-] PRVC FiO2 (%):  [30 %] 30 % Set Rate:  [25 bmp] 25 bmp Vt Set:  [650 mL] 650 mL PEEP:  [5 cmH20] 5 cmH20 Plateau Pressure:  [18 cmH20-20 cmH20] 20 cmH20 INTAKE / OUTPUT:  Intake/Output Summary (Last 24 hours) at 02/03/16 0910 Last data filed at 02/03/16 0800  Gross per 24 hour  Intake 1575.26 ml  Output   3925 ml   Net -2349.74 ml    PHYSICAL EXAMINATION: General:  Sedated, intubated, opens eyes Neuro:  Sedated, moves all ext but sedated  HEENT:  ETT in place  (-) NVD Cardiovascular:  Irregular, no m/r/g.  Lungs:  Course t/o but no accessory use. He has apnea on PSV trial  Abdomen:  Soft, non tender, non distended normal bowel sounds Musculoskeletal:  Normal bulk and tone Skin:  1+ pitting LE edema b/l  LABS:  CBC  Recent Labs Lab 01/30/16 0402 01/31/16 0323 02/01/16 0333  WBC 4.6 5.6 8.9  HGB 8.3* 9.1* 10.3*  HCT 23.9* 26.5* 30.2*  PLT 161 201 241   Coag's  Recent Labs Lab 02/01/16 0333 02/02/16 0336 02/03/16 0325  INR 1.65* 2.13* 2.79*   BMET  Recent Labs Lab 02/02/16 0336 02/03/16 0015 02/03/16 0325  NA 144 144 145  K 4.4 4.2 4.0  CL 112* 112* 111  CO2 19* 20* 20*  BUN 72* 91* 93*  CREATININE 4.67* 5.13* 5.24*  GLUCOSE 230* 322* 273*   Electrolytes  Recent Labs Lab 02/01/16 0333 02/01/16 1949 02/02/16 0336 02/03/16 0015 02/03/16 0325  CALCIUM 8.1* 8.5* 8.5* 8.3* 8.2*  MG  --  1.7  --  1.7  --   PHOS 6.1*  --  6.0*  --  5.6*   Sepsis Markers  Recent Labs Lab 02/01/16 0333 02/01/16 0820  LATICACIDVEN 1.2 2.4*   ABG  Recent Labs Lab 02/01/16 0016 02/01/16 0258  PHART 7.048* 7.232*  PCO2ART 63.3* 35.8  PO2ART 88.2 104*   Liver Enzymes  Recent Labs Lab 02/01/16 0333 02/02/16 0336 02/03/16 0325  ALBUMIN 3.0* 2.7* 2.8*   Cardiac Enzymes  Recent Labs Lab 02/02/16 0847  TROPONINI 0.03   Glucose  Recent Labs Lab 02/02/16 1554 02/02/16 2046 02/03/16 0001 02/03/16 0132 02/03/16 0401 02/03/16 0718  GLUCAP 280* 272* 279* 299* 228* 217*    Imaging Dg Chest 1 View  02/02/2016  CLINICAL DATA:  Pulmonary edema EXAM: CHEST 1 VIEW COMPARISON:  02/01/2016 FINDINGS: Endotracheal tube terminates 5.5 cm above the carina. Improving interstitial edema. Layering small bilateral pleural effusions. No pneumothorax. The heart is normal in  size. Enteric tube courses below the diaphragm. IMPRESSION: Endotracheal tube terminates 5.5 cm above the carina. Improving interstitial edema. Layering small bilateral pleural effusions. Electronically Signed   By: Charline Bills M.D.   On: 02/02/2016 11:12   Dg Chest Port 1 View  02/03/2016  CLINICAL DATA:  Shortness of breath. EXAM: PORTABLE CHEST 1 VIEW COMPARISON:  02/02/2016 . FINDINGS: Endotracheal tube and NG tube in stable position. Heart size normal. Persistent mild interstitial prominence with right lower lobe infiltrate noted. No pleural effusion noted on today's exam . Calcific debris noted in the stomach. IMPRESSION: 1. Lines and tubes in stable position. 2. Persistent mild bilateral interstitial prominence with new right lower lobe infiltrate . Electronically Signed   By: Maisie Fus  Register   On: 02/03/2016 07:18  R>L airspace disease. Right w/ patchy infiltrate.    ASSESSMENT / PLAN:   PULMONARY A: Acute hypoxic respiratory failure 2/2 Cardiogenic pumonary edema R/O HCAP-->R>L airspace disease  He is over-sedated. Not initiating much in the way of spont resp efforts. Suspect that this is d/t narcotics and slow clearance from renal failure  P:   - Daily breathing trials & PSV cycles as tolerated-->hope w/ decreased sedation his Ve should pick up - f/u sputum culture  - Wean for O2 sat >88 - pepcid - Oral hygiene   CARDIOVASCULAR A:  Afib- now in CVR, was in RVR overnight Cardiogenic pulmonary edema. Trop I negative Grade I diastolic dysfxn (echo 4/23) Elevated BNP P:  -keep even volume status - cont amio for now - cont coumadin  - cont tele   RENAL A:   AKI s/p stent for right hydro  Post ATN diuresis-->scr rising; suspect he is hemoconcentrated at this point  Metabolic acidosis -  P:   - hold lasix today - aim for euvolemia  - cont strict I&O - renal dose meds   GASTROINTESTINAL A:   No active issues P: PPI on vent   Cont TF   HEMATOLOGIC A:    Anemia of chronic disease Anticoagulation   P:  - monitor - coumadin per pharmacy    INFECTIOUS A:   R/o rll PNA P:   Observe fever trend. F/u Culture. Low threshold for starting abx.   ENDOCRINE A:   Hyperglycemia   P:   - CBG q 4. Keep CBG < 180 mg% - add scheduled insulin   NEUROLOGIC A:   Sedated P:   RASS goal: 0 - Daily awakenings. On fentanyl drip   FAMILY no family at bedside.  81 yo male with h/o afib, that was admitted w/ AKI and right hydro. He is s/p stent. Got aggressive IV hydration when he appeared to have post-ATN diuresis. This resulted in pulmonary edema and VDRF. Now sedated on vent. Think that he is euvolemic or even on the dry side. Had RVR last night and  is now on amio. For today we will approach his care as a "let the dust settle day". Hold Lasix and diuresis in an effort to let him re-equilibrate, d/c fentanyl as he does not seem to be clearing narcotics and thus failed PSV trial d/t low minute ventilation. I hope that he is hitting a plateau and that perhaps we can start weaning in the next couple of days.  Critical Care time with this pt today : 38 Daughter updated on plan of care at the bedside  Simonne Martinet ACNP-BC [facility] Pulmonary/Critical Care Pager # [phone] OR # [phone] if no answer   Attending Note:  I have examined patient, reviewed labs, studies and notes. I have discussed the case with Kreg Shropshire, and I agree with the data and plans as amended above.  81 yo man with hx A Fib and diastolic dysfxn, intubated with B infiltrates in setting A Fib, volume administration for Acute obstructive uropathy and renal failure. Course has been c/b RVR, now better rate controlled. Started on amiodarone last pm. Volume management has been difficult - he has been diuresed to help w pulm infiltrates but now has progression of his renal failure. On my evaluation today he is lethargic, too sedated to tolerate PSV. He has B crackles on lung exam. We will  hold lasix, follow renal status. Minimize sedation and allow him to wake up some, then push for spontaneous breathing, hopefully extubation soon . Independent critical care time is 35 minutes.   Levy Pupa, MD, PhD 02/03/2016, 11:57 AM [place] [facility] [phone] or if no answer [phone]

## 2016-02-03 NOTE — Progress Notes (Signed)
ETT secure. No breakdown noted on lips. Moved ETT to pts center

## 2016-02-03 NOTE — Progress Notes (Signed)
Date:  February 03, 2016 Chart reviewed for concurrent status and case management needs. Will continue to follow patient for changes and needs: full vent support Marcelle Smilinghonda Davis, BSN, RN, [place]   [phone]

## 2016-02-03 NOTE — Progress Notes (Signed)
eLink Physician-Brief Progress Note Patient Name: Paul Guerra DOB: 11/03/1934 MRN: [id number]   Date of Service  02/03/2016  HPI/Events of Note  Notified by bedside nurse of ongoing rapid ventricular response despite fluid resuscitation. Echocardiogram performed yesterday shows EF 65-70 percent but moderate concentric LVH.Serum magnesium level I.7 & potassium 4.2. Also note patient's BUN & creatinine continuing to rise. Patient also mildly hyperchloremic. Serum glucose 322 as well. Currently on Accu-Cheks every 4 hours with sensitive sliding scale insulin.  eICU Interventions  1. Magnesium sulfate 1 g IV 2. Start amiodarone drip 3. Hold off on further fluid resuscitation at this time 4. Increase to moderate sliding scale algorithm & continuing Accu-Cheks every 4 hours      Intervention Category Major Interventions: Arrhythmia - evaluation and management;Electrolyte abnormality - evaluation and management  Lawanda CousinsJennings Marcelle Bebout 02/03/2016, 1:21 AM

## 2016-02-03 NOTE — Progress Notes (Signed)
ETT secure. No breakdown noted on lips. Moved ETT to pts right. SBT trial done 10/5,30 - pts sats decreased to 60s and decreased RR. RN aware. RT placed pt back on full support.

## 2016-02-03 NOTE — Progress Notes (Signed)
Inpatient Diabetes Program Recommendations  AACE/ADA: New Consensus Statement on Inpatient Glycemic Control (2015)  Target Ranges:  Prepandial:   less than 140 mg/dL      Peak postprandial:   less than 180 mg/dL (1-2 hours)      Critically ill patients:  140 - 180 mg/dL   Review of Glycemic Control  Results for Stroh, Audria NineMAX B (MRN [id number]) as of 02/03/2016 12:34  Ref. Range 02/03/2016 00:01 02/03/2016 01:32 02/03/2016 04:01 02/03/2016 07:18 02/03/2016 11:47  Glucose-Capillary Latest Ref Range: 65-99 mg/dL 956279 (H) 387299 (H) 564228 (H) 217 (H) 296 (H)   Blood sugars in 200s.  Needs tighter glycemic control.  Inpatient Diabetes Program Recommendations:    Add ICU Hyperglycemia Protocol Q4H.  Will follow. Thank you. Ailene Ardshonda Trine Fread, RD, LDN, CDE Inpatient Diabetes Coordinator [phone]

## 2016-02-03 NOTE — Progress Notes (Signed)
ANTICOAGULATION CONSULT NOTE - Follow Up Consult  Pharmacy Consult for Warfarin Indication: atrial fibrillation  Allergies  Allergen Reactions  . Nitroglycerin Anaphylaxis  . Penicillins Anaphylaxis, Hives and Shortness Of Breath    Has patient had a PCN reaction causing immediate rash, facial/tongue/throat swelling, SOB or lightheadedness with hypotension: Yes Has patient had a PCN reaction causing severe rash involving mucus membranes or skin necrosis: Yes Has patient had a PCN reaction that required hospitalization Yes Has patient had a PCN reaction occurring within the last 10 years: No If all of the above answers are "NO", then may proceed with Cephalosporin use.   . Sudafed  [Pseudoephedrine Hcl] Hives  . Tetracycline Shortness Of Breath    Other reaction(s): SHORTNESS OF BREATH  . Vancomycin Hives  . Doxycycline Rash    Other reaction(s): RASH  . Sulfa Antibiotics Rash    Patient Measurements: Height: 6\' 3"  (190.5 cm) Weight: 182 lb 1.6 oz (82.6 kg) IBW/kg (Calculated) : 84.5  Vital Signs: Temp: 98.2 F (36.8 C) (04/24 0739) Temp Source: Axillary (04/24 0739) BP: 128/114 mmHg (04/24 0833) Pulse Rate: 128 (04/24 0833)  Labs:  Recent Labs  02/01/16 0333  02/02/16 0336 02/02/16 0847 02/03/16 0015 02/03/16 0325  HGB 10.3*  --   --   --   --   --   HCT 30.2*  --   --   --   --   --   PLT 241  --   --   --   --   --   LABPROT 19.0*  --  23.0*  --   --  28.1*  INR 1.65*  --  2.13*  --   --  2.79*  CREATININE 3.89*  < > 4.67*  --  5.13* 5.24*  TROPONINI  --   --   --  0.03  --   --   < > = values in this interval not displayed.  Estimated Creatinine Clearance: 13.6 mL/min (by C-G formula based on Cr of 5.24).  Assessment: Patient is a 81 yo M with hx colon cancer and atrial fibrillation on warfarin PTA who is currently admitted for acute renal failure, CAP, and UTI. Pharmacy consulted to assist with dosing warfarin. Lovenox bridging dc'd 4/23 stopped as INR  became therapeutic.  Home dose is 5mg  T/R/Sat and 2.5mg  all other days  Significant events: 4/10: Cystoscopy, right retrograde ureteropyelogram, right double-J stent placement. No bleeding described in procedure note. 4/22: transferred to ICU   Today, 02/03/2016 - INR now therapeutic and rising at 2.79 - CBC stable on 4/22 - No bleeding documented - Diet: TF - Drug interactions: amiodarone drip started 4/24 AM, but effect on warfarin (increased INR) should not be seen for several weeks  Goal of Therapy:  INR 2-3   Plan:  - Decrease warfarin to prevent further increase in INR - 1 mg PO/via feeding tube x1 - Daily INR - Monitor for s/s bleeding  Loralee PacasErin Varie Machamer, PharmD, BCPS Pager: [phone]  02/03/2016 8:56 AM

## 2016-02-03 NOTE — Progress Notes (Signed)
[facility] Rounding Note  Subjective: Required fluid boluses overnight for marginal blood pressures (1.5 liters in boluses) Then AF with RVR - amio drip  Objective: Vital signs in last 24 hours: Temp:  [98.2 F (36.8 C)-101.1 F (38.4 C)] 101.1 F (38.4 C) (04/24 1159) Pulse Rate:  [42-146] 70 (04/24 1330) Resp:  [19-25] 25 (04/24 1330) BP: (73-128)/(24-114) 109/51 mmHg (04/24 1330) SpO2:  [87 %-100 %] 98 % (04/24 1330) FiO2 (%):  [30 %] 30 % (04/24 1149) Weight:  [82.6 kg (182 lb 1.6 oz)] 82.6 kg (182 lb 1.6 oz) (04/24 0500) Weight change:   Intake/Output from previous day: 04/23 0701 - 04/24 0700 In: 1605.6 [I.V.:635.6; NG/GT:370; IV Piggyback:600] Out: 4025 [Urine:4025] Intake/Output this shift: Total I/O In: 438.2 [I.V.:108.2; NG/GT:330] Out: 850 [Urine:850]   BP 109/51 mmHg  Pulse 70  Temp(Src) 101.1 F (38.4 C) (Axillary)  Resp 25  Ht 6' 3"  (1.905 m)  Wt 82.6 kg (182 lb 1.6 oz)  BMI 22.76 kg/m2  SpO2 98% Intubated, sedated Pale appearing older gentleman Anteriorly fairly clear Cor: Tachy S1S2 no audible S3 GI: soft, non-tender; bowel sounds normal. Cannot determine if tenderness Ext: SCD's in place. No edema of LE's  Lab Results:  Recent Labs  02/01/16 0333  WBC 8.9  HGB 10.3*  HCT 30.2*  PLT 241     Recent Labs  02/03/16 0015 02/03/16 0325  NA 144 145  K 4.2 4.0  CL 112* 111  CO2 20* 20*  GLUCOSE 322* 273*  BUN 91* 93*  CREATININE 5.13* 5.24*  CALCIUM 8.3* 8.2*    Scheduled medications: . antiseptic oral rinse  7 mL Mouth Rinse QID  . chlorhexidine gluconate (SAGE KIT)  15 mL Mouth Rinse BID  . dutasteride  0.5 mg Oral Daily  . feeding supplement (PRO-STAT SUGAR FREE 64)  30 mL Per Tube BID  . insulin aspart  0-15 Units Subcutaneous Q4H  . insulin aspart  4 Units Subcutaneous Q4H  . nicotine  14 mg Transdermal QHS  . ranitidine  150 mg Oral QHS  . warfarin  1 mg Per NG tube ONCE-1800  . Warfarin - Pharmacist Dosing Inpatient   Does not  apply q1800   Infusions . amiodarone 30 mg/hr (02/03/16 1300)  . feeding supplement (VITAL 1.5 CAL) 1,000 mL (02/03/16 1300)   Dg Chest 1 View  02/02/2016  CLINICAL DATA:  Pulmonary edema EXAM: CHEST 1 VIEW COMPARISON:  02/01/2016 FINDINGS: Endotracheal tube terminates 5.5 cm above the carina. Improving interstitial edema. Layering small bilateral pleural effusions. No pneumothorax. The heart is normal in size. Enteric tube courses below the diaphragm. IMPRESSION: Endotracheal tube terminates 5.5 cm above the carina. Improving interstitial edema. Layering small bilateral pleural effusions. Electronically Signed   By: Julian Hy M.D.   On: 02/02/2016 11:12   Dg Chest Port 1 View  02/03/2016  CLINICAL DATA:  Shortness of breath. EXAM: PORTABLE CHEST 1 VIEW COMPARISON:  02/02/2016 . FINDINGS: Endotracheal tube and NG tube in stable position. Heart size normal. Persistent mild interstitial prominence with right lower lobe infiltrate noted. No pleural effusion noted on today's exam . Calcific debris noted in the stomach. IMPRESSION: 1. Lines and tubes in stable position. 2. Persistent mild bilateral interstitial prominence with new right lower lobe infiltrate . Electronically Signed   By: Marcello Moores  Register   On: 02/03/2016 07:18    4/7 Urine cx + > 100k yeast 4/7 UA - turbic, tntc WBC and RBC, 30 prot, many bact 4/11 UA -  cloudy, no bact/ tntc WBC / tntc RBC, 30 prot 4/22 UA - clear rare bact, mod Hb, neg prot, 6-30 rbc/ wbc, yeast +, 1.008  Background HTN >62yr BPH post TURP, colon CA s/p resx, cardiac arrest in past, recurrent renal stones with lithotripsy x 3 at least, recurrent UTIs, Afib, DM, [facility] . Last available was 1.12 from 04/2015. Serial Cr's here 3.7 to 3.9 to 4.1 and then 4.6. Had pyuria, R hydronephrosis and ? pneumonia on initial evaluation. Tx here with IVF with bicarb, R double J stent placed 4/10. High FeNa, had been on  Fluconazole, Cipro, levaquin. Was also on Lisinopril PTA.Developed resp distress 4/22 after fluid rescusitation and required intubation. Had excellent UOP after that with lasix (2.8-4 liters) but with no improvement in renal function  Assessment/Plan:  1. AKI (on ? CKD) Recent baseline creatinine not known (1.12 03/2015) - initial thought was hemodynamic AKI (ACE, low BP's) vs AIN d/t quinolones plus effects of obstruction.  Now recurrent hypotension, hemodynamic effects of AF with RVR factored in - he is not recovering any GFR.  Did not respond to volume rescusitation  (and in fact developed pulmonary edema) - diuresed well - but upward trend in creatinine has continued, seemingly independent of any intervention. Lasix on hold now w/thought maybe intravascularly dry.  No absolute indication for RRT just yet, but moving toward that if continued level of aggressive care is desired..I have spoken with Dr. BLamonte Sakai2. Resp failure/ pulm edema on vent 3. R hydro s/p stent - will re-ultrasound R kidney to assure that hydro has resolved post stenting 4. AF with RVR - IV amio 5. Yeast UTI sp rx 6. Pyuria - ? Leftover from infection vs AIN. No eos in urine but PPI stopped anyway at outside chance causing problem (zantac substituted) 7. DM controlled 8. Hx renal stones  CJamal Maes MD CArnoldPager 02/03/2016, 2:26 PM

## 2016-02-04 DIAGNOSIS — N390 Urinary tract infection, site not specified: Secondary | ICD-10-CM

## 2016-02-04 DIAGNOSIS — N1330 Unspecified hydronephrosis: Secondary | ICD-10-CM

## 2016-02-04 LAB — RENAL FUNCTION PANEL
ANION GAP: 15 (ref 5–15)
Albumin: 2.8 g/dL — ABNORMAL LOW (ref 3.5–5.0)
BUN: 109 mg/dL — ABNORMAL HIGH (ref 6–20)
CALCIUM: 8.4 mg/dL — AB (ref 8.9–10.3)
CO2: 21 mmol/L — AB (ref 22–32)
Chloride: 111 mmol/L (ref 101–111)
Creatinine, Ser: 5.96 mg/dL — ABNORMAL HIGH (ref 0.61–1.24)
GFR calc non Af Amer: 8 mL/min — ABNORMAL LOW (ref >60)
GFR, EST AFRICAN AMERICAN: 9 mL/min — AB (ref >60)
Glucose, Bld: 293 mg/dL — ABNORMAL HIGH (ref 65–99)
POTASSIUM: 4.1 mmol/L (ref 3.5–5.1)
Phosphorus: 5.2 mg/dL — ABNORMAL HIGH (ref 2.5–4.6)
SODIUM: 147 mmol/L — AB (ref 135–145)

## 2016-02-04 LAB — GLUCOSE, CAPILLARY
GLUCOSE-CAPILLARY: 298 mg/dL — AB (ref 65–99)
GLUCOSE-CAPILLARY: 172 mg/dL — AB (ref 65–99)
GLUCOSE-CAPILLARY: 173 mg/dL — AB (ref 65–99)
Glucose-Capillary: 248 mg/dL — ABNORMAL HIGH (ref 65–99)
Glucose-Capillary: 296 mg/dL — ABNORMAL HIGH (ref 65–99)
Glucose-Capillary: 256 mg/dL — ABNORMAL HIGH (ref 65–99)
Glucose-Capillary: 222 mg/dL — ABNORMAL HIGH (ref 65–99)
Glucose-Capillary: 225 mg/dL — ABNORMAL HIGH (ref 65–99)

## 2016-02-04 LAB — PROTIME-INR
INR: 2.25 — ABNORMAL HIGH (ref 0.00–1.49)
Prothrombin Time: 24.7 seconds — ABNORMAL HIGH (ref 11.6–15.2)

## 2016-02-04 MED ORDER — CETYLPYRIDINIUM CHLORIDE 0.05 % MT LIQD: 7.0000 mL | Freq: Two times a day (BID) | OROMUCOSAL | Status: DC

## 2016-02-04 MED ORDER — DEXTROSE 5 % IV SOLN
INTRAVENOUS | Status: DC
  Administered 2016-02-04: 16:00:00 via INTRAVENOUS
  Filled 2016-02-04 (×3): qty 1000

## 2016-02-04 MED ORDER — METOPROLOL TARTRATE 5 MG/5ML IV SOLN
2.5000 mg | INTRAVENOUS | Status: DC | PRN
Start: 2016-02-04 — End: 2016-02-07
  Administered 2016-02-04 – 2016-02-06 (×4): 5 mg via INTRAVENOUS
  Filled 2016-02-04 (×5): qty 5

## 2016-02-04 MED ORDER — WARFARIN SODIUM 5 MG PO TABS: 5.0000 mg | ORAL_TABLET | Freq: Once | ORAL | Status: AC

## 2016-02-04 MED ORDER — AMIODARONE IV BOLUS ONLY 150 MG/100ML
150.0000 mg | Freq: Once | INTRAVENOUS | Status: AC
  Administered 2016-02-04: 150 mg via INTRAVENOUS

## 2016-02-04 NOTE — Progress Notes (Signed)
PULMONARY / CRITICAL CARE MEDICINE   Name: Paul Guerra MRN: [id number] DOB: 10-Oct-1935    ADMISSION DATE:  01/17/2016  CHIEF COMPLAINT:  Hypoxemic and hypercapnic respiratory failure  INITIAL PRESENTATION:  Patient is intubated and sedated, HPI taken from chart review. Transfer from [facility] on 01/17/16. Patient is a 81 year old male with history of pancreatitis, DM, HTN, recurrent UTI, recurrent kidney stones, atrial fibrillation, and colon cancer (right hemicolectomy). Presented to [facility] ED with elevated creatinine, probable UTI, and pneumonia. He has been having urinary frequency, dysuria, confusion, and suprapubic abdominal pain over the past month. He saw PCP 2 days prior to admission and was started on ciprofloxacin for UTI, but on his way home his PCP called to tell him to go to ED for IV fluids and antibiotics. Blood pressure was 97/51, CT showed hydronephrosis and kidney stones, and chest x-ray showed pneumonia. Acute renal failure not improving very well, thought to be secondary to ATN.   SIGNIFICANT EVENTS: 4/7 admitted for abd pain/UTI/AKI 4/22 went into resp distress after fluid resuscitation. Intubated. PCCM consulted   SUBJECTIVE:  Now awake and f/c  VITAL SIGNS: Temp:  [99 F (37.2 C)-102.1 F (38.9 C)] 99.5 F (37.5 C) (04/25 0758) Pulse Rate:  [70-146] 92 (04/25 0900) Resp:  [13-26] 13 (04/25 0900) BP: (87-175)/(42-136) 134/45 mmHg (04/25 0800) SpO2:  [96 %-100 %] 99 % (04/25 0900) FiO2 (%):  [30 %] 30 % (04/25 0840) Weight:  [181 lb 3.5 oz (82.2 kg)] 181 lb 3.5 oz (82.2 kg) (04/25 0400) HEMODYNAMICS:   VENTILATOR SETTINGS: Vent Mode:  [-] PSV;CPAP FiO2 (%):  [30 %] 30 % Set Rate:  [25 bmp] 25 bmp Vt Set:  [650 mL] 650 mL PEEP:  [5 cmH20] 5 cmH20 Pressure Support:  [5 cmH20] 5 cmH20 Plateau Pressure:  [19 cmH20-22 cmH20] 19 cmH20 INTAKE / OUTPUT:  Intake/Output Summary (Last 24 hours) at 02/04/16 0955 Last data filed at 02/04/16 0900  Gross per 24  hour  Intake 1555.8 ml  Output   2630 ml  Net -1074.2 ml    PHYSICAL EXAMINATION: General: awake, alert. Passing SBT  Neuro:  Awake, moves all ext. Follows commands rapidly HEENT:  ETT in place  (-) NVD Cardiovascular:  Irregular, no m/r/g.  Lungs:  Clear t/o,  no accessory use.  Abdomen:  Soft, non tender, non distended normal bowel sounds Musculoskeletal:  Normal bulk and tone Skin:  1+ pitting LE edema b/l  LABS:  CBC  Recent Labs Lab 01/30/16 0402 01/31/16 0323 02/01/16 0333  WBC 4.6 5.6 8.9  HGB 8.3* 9.1* 10.3*  HCT 23.9* 26.5* 30.2*  PLT 161 201 241   Coag's  Recent Labs Lab 02/02/16 0336 02/03/16 0325 02/04/16 0341  INR 2.13* 2.79* 2.25*   BMET  Recent Labs Lab 02/03/16 0015 02/03/16 0325 02/04/16 0341  NA 144 145 147*  K 4.2 4.0 4.1  CL 112* 111 111  CO2 20* 20* 21*  BUN 91* 93* 109*  CREATININE 5.13* 5.24* 5.96*  GLUCOSE 322* 273* 293*   Electrolytes  Recent Labs Lab 02/01/16 1949 02/02/16 0336 02/03/16 0015 02/03/16 0325 02/04/16 0341  CALCIUM 8.5* 8.5* 8.3* 8.2* 8.4*  MG 1.7  --  1.7  --   --   PHOS  --  6.0*  --  5.6* 5.2*   Sepsis Markers  Recent Labs Lab 02/01/16 0333 02/01/16 0820  LATICACIDVEN 1.2 2.4*   ABG  Recent Labs Lab 02/01/16 0016 02/01/16 0258  PHART 7.048* 7.232*  PCO2ART 63.3* 35.8  PO2ART 88.2 104*   Liver Enzymes  Recent Labs Lab 02/02/16 0336 02/03/16 0325 02/04/16 0341  ALBUMIN 2.7* 2.8* 2.8*   Cardiac Enzymes  Recent Labs Lab 02/02/16 0847  TROPONINI 0.03   Glucose  Recent Labs Lab 02/03/16 1147 02/03/16 1539 02/03/16 1924 02/04/16 02/04/16 0413 02/04/16 0745  GLUCAP 296* 249* 298* 296* 248* 256*    Imaging US Renal  02/03/2016  CLINICAL DATA:  Right hydronephrosis EXAM: RENAL / URINARY TRACT ULTRASOUND COMPLETE COMPARISON:  01/24/2016 and previous FINDINGS: Right Kidney: Length: 10.6 cm. Isoechoic to adjacent liver. 10 x 8 x 9 mm probable cyst, inferior pole. No  hydronephrosis. Left Kidney: Length: 9 cm. Echogenicity within normal limits. No mass or hydronephrosis visualized. Bladder: Decompressed by Foley catheter. IMPRESSION: 1. No hydronephrosis. 2. Small right lower pole renal cyst. Electronically Signed   By: Corlis Leak M.D.   On: 02/03/2016 15:53  R>L airspace disease. Right w/ patchy infiltrate.    ASSESSMENT / PLAN:   PULMONARY A: Acute hypoxic respiratory failure 2/2 Cardiogenic pumonary edema R/O HCAP-->R>L airspace disease  He is now more awake, passed SBT P:   - extubate - f/u sputum culture  - Wean for O2 sat >88 - pepcid - Oral hygiene   CARDIOVASCULAR A:  Afib- now in CVR, was in RVR overnight Cardiogenic pulmonary edema. Trop I negative Grade I diastolic dysfxn (echo 4/23) Elevated BNP P:  -keep even volume status - cont amio for now - cont coumadin  - cont tele   RENAL A:   AKI s/p stent for right hydro; f/u US 4/24 negative for hydro.  Post ATN diuresis-->scr rising; suspect he is hemoconcentrated at this point  Metabolic acidosis Mild hypernatremia  -  P:   - hold lasix today - aim for euvolemia  - cont strict I&O - renal dose meds - gentle free water replacement can likely take place w/ oral intake   GASTROINTESTINAL A:   No active issues P: PPI on vent   hold TF s/p extubation    HEMATOLOGIC A:   Anemia of chronic disease Anticoagulation   P:  - monitor - coumadin per pharmacy    INFECTIOUS A:   R/o rll PNA P:   Observe fever trend. F/u Culture. Low threshold for starting abx.   ENDOCRINE A:   Hyperglycemia   P:   - CBG q 4. Keep CBG < 180 mg% - added scheduled insulin -->will dc as tube feeds will be held  NEUROLOGIC A:   Sedated-->resolved P:   RASS goal: 0 - Daily awakenings. Dc all sedating gtts   FAMILY no family at bedside.  81 yo male with h/o afib, that was admitted w/ AKI and right hydro. He is s/p stent. Got aggressive IV hydration when he appeared to have  post-ATN diuresis. This resulted in pulmonary edema and VDRF. Now more awake. He continues to auto-diurese.  I hope that he is hitting a plateau from a renal stand-point but his Na is starting to rise as well as his creatinine. He is ready for extubation. If he can take PO then we will let him correct his water imbalance w/ PO intake. Still think we need to hold further diuresis. Appreciate renals support. At this point no emergent need for HD.    Simonne Martinet ACNP-BC [facility] Pulmonary/Critical Care Pager # [phone] OR # [phone] if no answer   Attending Note:  I have examined patient, reviewed labs, studies and notes. I  have discussed the case with Kreg ShropshireP Babcock, and I agree with the data and plans as amended above. 81 yo man with hx A Fib and diastolic dysfxn, intubated with B infiltrates in setting A Fib/RVR, volume administration for Acute obstructive uropathy and renal failure. On my evaluation today he is more awake, will follow commands. He is tolerating PSV and has a strong cough on command. Lungs with scattered rhonchi. We will push for extubation 4/25 today. He is auto-diuresing but serum Cr has not yet plateaued - I'm still hopeful that it will do so, although must consider some other progressive renal process if no resolution soon. Will continue to follow, hold off on any further diuretics. Appreciate Dr Elza Rafterunham's assistance - discussed case with her on 4/24.   Independent critical care time is 35 minutes.   Levy Pupaobert Sabrinna Yearwood, MD, PhD 02/04/2016, 10:33 AM [facility] [phone] or if no answer [phone]

## 2016-02-04 NOTE — Procedures (Signed)
Extubation Procedure Note  Patient Details:   Name: Paul Guerra DOB: [birth year] MRN: [id number]   Airway Documentation:     Evaluation  O2 sats: stable throughout Complications: No apparent complications Patient did tolerate procedure well. Bilateral Breath Sounds: Clear, Diminished  Pt placed on 2 lpm [place] Positive for leak test.  Yes  Mercy RidingBurgess, Jahzeel Poythress L 02/04/2016, 10:32 AM

## 2016-02-04 NOTE — Progress Notes (Signed)
[facility] Rounding Note  Subjective:  Extubated earlier today Follow commands, smiles, weak cough Daughter Arline Asp in with him Still not able to take po BP much more stable  Objective: Vital signs in last 24 hours: Temp:  [99 F (37.2 C)-102.1 F (38.9 C)] 99.3 F (37.4 C) (04/25 1147) Pulse Rate:  [71-146] 85 (04/25 1400) Resp:  [11-26] 23 (04/25 1400) BP: (99-175)/(42-136) 143/44 mmHg (04/25 1400) SpO2:  [85 %-100 %] 85 % (04/25 1400) FiO2 (%):  [30 %] 30 % (04/25 0840) Weight:  [82.2 kg (181 lb 3.5 oz)] 82.2 kg (181 lb 3.5 oz) (04/25 0400) Weight change: -1.9 kg (-4 lb 3 oz)  Intake/Output from previous day: 04/24 0701 - 04/25 0700 In: 1632.1 [I.V.:392.1; NG/GT:1240] Out: 2630 [Urine:2630] Intake/Output this shift: Total I/O In: 225.3 [I.V.:150.3; Other:50; NG/GT:25] Out: 1050 [Urine:1050]   BP 143/44 mmHg  Pulse 85  Temp(Src) 99.3 F (37.4 C) (Axillary)  Resp 23  Ht  (1.905 m)  Wt 82.2 kg (181 lb 3.5 oz)  BMI 22.65 kg/m2  SpO2 85% Extubated, follow commands, smiling, trying to interact with me and with daughter Weak cough Lungs anteriorly fairly clear S1S2 No S3 GI: soft, non-tender; bowel sounds normal. No focal tenderness Ext: SCD's in place.  No edema of LE's Clear urine in foley   Recent Labs  02/03/16 0325 02/04/16 0341  NA 145 147*  K 4.0 4.1  CL 111 111  CO2 20* 21*  GLUCOSE 273* 293*  BUN 93* 109*  CREATININE 5.24* 5.96*  CALCIUM 8.2* 8.4*     Recent Labs  02/04/16 0413 02/04/16 0745 02/04/16 1125  GLUCAP 248* 256* 172*    Scheduled medications: . antiseptic oral rinse  7 mL Mouth Rinse QID  . dutasteride  0.5 mg Oral Daily  . insulin aspart  0-15 Units Subcutaneous Q4H  . nicotine  14 mg Transdermal QHS  . warfarin  5 mg Oral ONCE-1800  . Warfarin - Pharmacist Dosing Inpatient   Does not apply q1800   Infusions . amiodarone 30 mg/hr (02/04/16 1400)   US Renal  02/03/2016  CLINICAL DATA:  Right hydronephrosis EXAM: RENAL  / URINARY TRACT ULTRASOUND COMPLETE COMPARISON:  01/24/2016 and previous FINDINGS: Right Kidney: Length: 10.6 cm. Isoechoic to adjacent liver. 10 x 8 x 9 mm probable cyst, inferior pole. No hydronephrosis. Left Kidney: Length: 9 cm. Echogenicity within normal limits. No mass or hydronephrosis visualized. Bladder: Decompressed by Foley catheter. IMPRESSION: 1. No hydronephrosis. 2. Small right lower pole renal cyst. Electronically Signed   By: Corlis Leak M.D.   On: 02/03/2016 15:53   Dg Chest Port 1 View  02/03/2016  CLINICAL DATA:  Shortness of breath. EXAM: PORTABLE CHEST 1 VIEW COMPARISON:  02/02/2016 . FINDINGS: Endotracheal tube and NG tube in stable position. Heart size normal. Persistent mild interstitial prominence with right lower lobe infiltrate noted. No pleural effusion noted on today's exam . Calcific debris noted in the stomach. IMPRESSION: 1. Lines and tubes in stable position. 2. Persistent mild bilateral interstitial prominence with new right lower lobe infiltrate . Electronically Signed   By: Maisie Fus  Register   On: 02/03/2016 07:18    4/7 Urine cx + > 100k yeast 4/7 UA - turbic, tntc WBC and RBC, 30 prot, many bact 4/11 UA - cloudy, no bact/ tntc WBC / tntc RBC, 30 prot 4/22 UA - clear rare bact, mod Hb, neg prot, 6-30 rbc/ wbc, yeast +, 1.008  Background HTN >52yr, BPH post TURP, colon CA  s/p resx, cardiac arrest in past, recurrent renal stones with lithotripsy x 3 at least, recurrent UTIs, Afib, DM, [facility] with Cr of 4 . Last available was 1.12 from 04/2015. Serial Cr's here 3.7 to 3.9 to 4.1 and then 4.6 and we were asked to see.  Had pyuria, R hydronephrosis and ? pneumonia on initial evaluation. Tx  with IVF with bicarb, R double J stent placed 4/10. High FeNa, had been on Fluconazole, Cipro, levaquin. Was also on Lisinopril PTA.Developed resp distress 4/22 after fluid rescusitation and required intubation. Had excellent UOP after that with  lasix (2.8-4 liters) but with no improvement in renal function.   Assessment/Plan:  1. AKI (on ? CKD) Recent baseline creatinine not known (1.12 03/2015) - initial thought was hemodynamic AKI (ACE, low BP's) vs AIN d/t quinolones plus effects of obstruction.  With development of  recurrent hypotension, and hemodynamic effects of AF with RVR factored in (02/01/16) - suspect this is ATN.  He is non-oliguric but he is not recovering any GFR.  The upward trend in creatinine has continued, seemingly independent of any intervention. Lasix on hold and UOP remains good. No hard indication for RRT just yet, but moving toward that if continued level of aggressive care is desired..I have spoken with Dr. Delton CoombesByrum (4/24) and today I spoke with his daughter. 2. Hypernatremia - mild. O>I and not getting any free water. No NG tube and can't take po yet. Start D5W at 50 cc/hour. (and be prepared for worsening BS) 3. Resp failure - extubated today 4. R hydro s/p stent - repeat US 4/24 no residual hydro.  5. AF with RVR - IV amio - back in sinus now. BP stable. 6. Yeast UTI sp rx 7. Pyuria - ? Leftover from infection vs AIN. No eos in urine but PPI stopped anyway at outside chance causing problem (zantac substituted) 8. DM controlled 9. Hx renal stones  Camille Balynthia Arnel Wymer, MD [facility] [phone] Pager 02/04/2016, 3:01 PM

## 2016-02-04 NOTE — Progress Notes (Signed)
eLink Physician-Brief Progress Note Patient Name: Paul LodgeMax B Guerra DOB: May 13, 1935 MRN: [id number]   Date of Service  02/04/2016  HPI/Events of Note  1) Rapid AF 2) Poor cough, RN reports rhonchi  eICU Interventions  1) Amiodarone 150 mg bolus - already on infusion 2) PRN NTS     Intervention Category Major Interventions: Arrhythmia - evaluation and management  Billy FischerDavid Simonds 02/04/2016, 9:23 PM

## 2016-02-04 NOTE — Progress Notes (Signed)
Patient extubated today. Coumadin 5 mg tab ordered at 1800. Bedside swallow eval completed, and patient unable to sip water. Concerns of aspiration. Elink (Dr. Sung AmabileSimonds) notified. RN did not receive call back and followed up. Still awaiting call.

## 2016-02-04 NOTE — Progress Notes (Signed)
ANTICOAGULATION CONSULT NOTE - Follow Up Consult  Pharmacy Consult for Warfarin Indication: atrial fibrillation  Allergies  Allergen Reactions  . Nitroglycerin Anaphylaxis  . Penicillins Anaphylaxis, Hives and Shortness Of Breath    Has patient had a PCN reaction causing immediate rash, facial/tongue/throat swelling, SOB or lightheadedness with hypotension: Yes Has patient had a PCN reaction causing severe rash involving mucus membranes or skin necrosis: Yes Has patient had a PCN reaction that required hospitalization Yes Has patient had a PCN reaction occurring within the last 10 years: No If all of the above answers are "NO", then may proceed with Cephalosporin use.   . Sudafed  [Pseudoephedrine Hcl] Hives  . Tetracycline Shortness Of Breath    Other reaction(s): SHORTNESS OF BREATH  . Vancomycin Hives  . Doxycycline Rash    Other reaction(s): RASH  . Sulfa Antibiotics Rash    Patient Measurements: Height: 6\' 3"  (190.5 cm) Weight: 181 lb 3.5 oz (82.2 kg) IBW/kg (Calculated) : 84.5  Vital Signs: Temp: 99 F (37.2 C) (04/25 0400) Temp Source: Axillary (04/25 0400) BP: 140/52 mmHg (04/25 0600) Pulse Rate: 79 (04/25 0600)  Labs:  Recent Labs  02/02/16 0336 02/02/16 0847 02/03/16 0015 02/03/16 0325 02/04/16 0341  LABPROT 23.0*  --   --  28.1* 24.7*  INR 2.13*  --   --  2.79* 2.25*  CREATININE 4.67*  --  5.13* 5.24* 5.96*  TROPONINI  --  0.03  --   --   --     Estimated Creatinine Clearance: 11.9 mL/min (by C-G formula based on Cr of 5.96).  Assessment: Patient is a 81 yo M with hx colon cancer and atrial fibrillation on warfarin PTA who is currently admitted for acute renal failure, CAP, and UTI. Pharmacy consulted to assist with dosing warfarin. Lovenox bridging dc'd 4/23 stopped as INR became therapeutic.  Home dose is 5mg  T/R/Sat and 2.5mg  all other days  Today, 02/04/2016 - INR remains therapeutic at 2.25 - CBC stable on 4/22 - No bleeding documented -  Diet: TF - administering warfarin via NG tube - Drug interactions: amiodarone drip started 4/24 AM, but effect on warfarin (increased INR) should not be seen for several weeks  Goal of Therapy:  INR 2-3   Plan:  - Warfarin 5 mg via NGT x1 - Daily INR - Monitor for s/s bleeding  Loralee PacasErin Gaege Sangalang, PharmD, BCPS Pager: [phone]  02/04/2016 7:42 AM

## 2016-02-04 NOTE — Progress Notes (Signed)
Nutrition Follow-up  DOCUMENTATION CODES:   Non-severe (moderate) malnutrition in context of acute illness/injury  INTERVENTION:   -Diet advancement per MD -Once diet is advanced, recommend nutritional supplementation. -RD to continue to monitor for plan.  NUTRITION DIAGNOSIS:   Inadequate oral intake related to inability to eat as evidenced by NPO status.  Ongoing.  GOAL:   Patient will meet greater than or equal to 90% of their needs  Not meeting.  MONITOR:   PO intake, Diet advancement, Labs, Weight trends, I & O's  ASSESSMENT:    81 y.o. male patient with history of chronic pancreatitis, diabetes mellitus, BPH, chronic anemia, remote history of colon cancer status post right hemicolectomy, hypertension, hyperlipidemia, history of recurrent UTIs, history of recurrent kidney stones, history of atrial fibrillation on chronic anticoagulation with Coumadin who [facility] with a elevated creatinine 4 baseline approximately 1.1 and probable UTI and pneumonia. CT stone study protocol which was done showed a right hydronephrosis and concern for obstruction. [facility] spoke with Dr. Thad Rangereynolds urology who accepted the patient in consult. It was noted that patient also had an episode of hypoglycemia which has since resolved. INR there was 4.2. Patient was also noted to have a metabolic acidosis felt to be secondary to acute renal failure.  Patient extubated today. Tube feeding stopped. Continues to be NPO, will monitor for diet advancement.   Medications reviewed. Labs reviewed: CBGs: 172-256 Elevated Na, Phos  Diet Order:  Diet NPO time specified  Skin:  Wound (see comment) (closed perinieal incision)  Last BM:  PTA  Height:   Ht Readings from Last 1 Encounters:  02/02/16 6\' 3"  (1.905 m)    Weight:   Wt Readings from Last 1 Encounters:  02/04/16 181 lb 3.5 oz (82.2 kg)    Ideal Body Weight:  89.1 kg  BMI:  Body  mass index is 22.65 kg/(m^2).  Estimated Nutritional Needs:   Kcal:  2000-2200  Protein:  105-115g  Fluid:  2L/day  EDUCATION NEEDS:   No education needs identified at this time  Paul FrancoLindsey [name], MS, RD, LDN Pager: [phone] After Hours Pager: [phone]

## 2016-02-05 ENCOUNTER — Inpatient Hospital Stay (HOSPITAL_COMMUNITY): Payer: Medicare Other | Admitting: Certified Registered"

## 2016-02-05 ENCOUNTER — Inpatient Hospital Stay (HOSPITAL_COMMUNITY): Payer: Medicare Other

## 2016-02-05 DIAGNOSIS — J151 Pneumonia due to Pseudomonas: Secondary | ICD-10-CM

## 2016-02-05 LAB — GLUCOSE, CAPILLARY
GLUCOSE-CAPILLARY: 216 mg/dL — AB (ref 65–99)
GLUCOSE-CAPILLARY: 238 mg/dL — AB (ref 65–99)
Glucose-Capillary: 188 mg/dL — ABNORMAL HIGH (ref 65–99)
Glucose-Capillary: 184 mg/dL — ABNORMAL HIGH (ref 65–99)
Glucose-Capillary: 162 mg/dL — ABNORMAL HIGH (ref 65–99)

## 2016-02-05 LAB — CULTURE, RESPIRATORY W GRAM STAIN: Special Requests: NORMAL

## 2016-02-05 LAB — BLOOD GAS, ARTERIAL
ACID-BASE DEFICIT: 2.6 mmol/L — AB (ref 0.0–2.0)
Bicarbonate: 21 mEq/L (ref 20.0–24.0)
Drawn by: 422461
FIO2: 1
O2 Saturation: 99.1 %
PEEP: 5 cmH2O
Patient temperature: 101.5
RATE: 16 resp/min
TCO2: 20 mmol/L (ref 0–100)
VT: 650 mL
pCO2 arterial: 36.1 mmHg (ref 35.0–45.0)
pH, Arterial: 7.392 (ref 7.350–7.450)
pO2, Arterial: 443 mmHg — ABNORMAL HIGH (ref 80.0–100.0)

## 2016-02-05 LAB — BASIC METABOLIC PANEL
ANION GAP: 17 — AB (ref 5–15)
BUN: 97 mg/dL — AB (ref 6–20)
CALCIUM: 8.5 mg/dL — AB (ref 8.9–10.3)
CO2: 21 mmol/L — AB (ref 22–32)
Chloride: 109 mmol/L (ref 101–111)
Creatinine, Ser: 5.76 mg/dL — ABNORMAL HIGH (ref 0.61–1.24)
GFR calc Af Amer: 10 mL/min — ABNORMAL LOW (ref >60)
GFR, EST NON AFRICAN AMERICAN: 8 mL/min — AB (ref >60)
GLUCOSE: 219 mg/dL — AB (ref 65–99)
Potassium: 4.2 mmol/L (ref 3.5–5.1)
Sodium: 147 mmol/L — ABNORMAL HIGH (ref 135–145)

## 2016-02-05 LAB — CULTURE, RESPIRATORY

## 2016-02-05 LAB — PROTIME-INR
INR: 2.39 — AB (ref 0.00–1.49)
PROTHROMBIN TIME: 25.8 s — AB (ref 11.6–15.2)

## 2016-02-05 MED ORDER — LANTHANUM CARBONATE 500 MG PO CHEW
1000.0000 mg | CHEWABLE_TABLET | Freq: Three times a day (TID) | ORAL | Status: DC
  Administered 2016-02-05 – 2016-02-07 (×6): 1000 mg via NASOGASTRIC
  Filled 2016-02-05 (×14): qty 2

## 2016-02-05 MED ORDER — VITAL 1.5 CAL PO LIQD
1000.0000 mL | ORAL | Status: DC
  Administered 2016-02-05: 1000 mL
  Filled 2016-02-05 (×2): qty 1000

## 2016-02-05 MED ORDER — ANTISEPTIC ORAL RINSE SOLUTION (CORINZ)
7.0000 mL | Freq: Four times a day (QID) | OROMUCOSAL | Status: DC
  Administered 2016-02-05 – 2016-02-06 (×4): 7 mL via OROMUCOSAL

## 2016-02-05 MED ORDER — PROPOFOL 10 MG/ML IV BOLUS
INTRAVENOUS | Status: AC
  Filled 2016-02-05: qty 20

## 2016-02-05 MED ORDER — FREE WATER
200.0000 mL | Freq: Three times a day (TID) | Status: DC
  Administered 2016-02-05 (×3): 200 mL

## 2016-02-05 MED ORDER — PRO-STAT SUGAR FREE PO LIQD
30.0000 mL | Freq: Two times a day (BID) | ORAL | Status: DC
  Administered 2016-02-05 (×2): 30 mL via ORAL
  Filled 2016-02-05 (×2): qty 30

## 2016-02-05 MED ORDER — FENTANYL CITRATE (PF) 100 MCG/2ML IJ SOLN
25.0000 ug | INTRAMUSCULAR | Status: DC | PRN
  Administered 2016-02-05 (×2): 25 ug via INTRAVENOUS
  Filled 2016-02-05 (×2): qty 2

## 2016-02-05 MED ORDER — CHLORHEXIDINE GLUCONATE 0.12% ORAL RINSE (MEDLINE KIT)
15.0000 mL | Freq: Two times a day (BID) | OROMUCOSAL | Status: DC
  Administered 2016-02-05 (×2): 15 mL via OROMUCOSAL

## 2016-02-05 MED ORDER — SODIUM CHLORIDE 0.9 % IV BOLUS (SEPSIS)
1000.0000 mL | Freq: Once | INTRAVENOUS | Status: AC
  Administered 2016-02-05: 1000 mL via INTRAVENOUS

## 2016-02-05 MED ORDER — WARFARIN SODIUM 2.5 MG PO TABS
2.5000 mg | ORAL_TABLET | Freq: Once | ORAL | Status: AC
Start: 2016-02-05 — End: 2016-02-05
  Administered 2016-02-05: 2.5 mg via NASOGASTRIC
  Filled 2016-02-05: qty 1

## 2016-02-05 MED ORDER — PROPOFOL 10 MG/ML IV BOLUS
INTRAVENOUS | Status: DC | PRN
  Administered 2016-02-05: 100 mg via INTRAVENOUS

## 2016-02-05 MED ORDER — IMIPENEM-CILASTATIN 250 MG IV SOLR
250.0000 mg | Freq: Two times a day (BID) | INTRAVENOUS | Status: DC
  Administered 2016-02-05 – 2016-02-12 (×16): 250 mg via INTRAVENOUS
  Filled 2016-02-05 (×17): qty 250

## 2016-02-05 MED ORDER — INSULIN ASPART 100 UNIT/ML ~~LOC~~ SOLN
8.0000 [IU] | SUBCUTANEOUS | Status: DC
  Administered 2016-02-05 – 2016-02-06 (×6): 8 [IU] via SUBCUTANEOUS

## 2016-02-05 NOTE — Progress Notes (Signed)
Pharmacy Antibiotic Note  Paul Guerra is a 81 y.o. male admitted on 01/17/2016 [facility] with elevated creatinine, probable UTI, and pneumonia. He was treated with 5 days of levaquin for presumed CAP.  On 4/22, he went into respiration distress after fluid resuscitation and required intubation. Sputum cultures reveal Pseudomonas aeruginosa. Given significant history of PCN allergy, Pharmacy has been consulted for Primaxin dosing.  Plan:  Primaxin 250mg  IV q12h  Monitor renal function closely and adjust as needed  Height: 6\' 3"  (190.5 cm) Weight: 176 lb 9.4 oz (80.1 kg) IBW/kg (Calculated) : 84.5  Temp (24hrs), Avg:99.8 F (37.7 C), Min:98.5 F (36.9 C), Tristan:101.5 F (38.6 C)   Recent Labs Lab 01/30/16 0402 01/31/16 0323  02/01/16 0333 02/01/16 0820  02/02/16 0336 02/03/16 0015 02/03/16 0325 02/04/16 0341 02/05/16 0324  WBC 4.6 5.6  --  8.9  --   --   --   --   --   --   --   CREATININE 3.97*  --   < > 3.89*  --   < > 4.67* 5.13* 5.24* 5.96* 5.76*  LATICACIDVEN  --   --   --  1.2 2.4*  --   --   --   --   --   --   < > = values in this interval not displayed.  Estimated Creatinine Clearance: 12 mL/min (by C-G formula based on Cr of 5.76).    Allergies  Allergen Reactions  . Nitroglycerin Anaphylaxis  . Penicillins Anaphylaxis, Hives and Shortness Of Breath    Has patient had a PCN reaction causing immediate rash, facial/tongue/throat swelling, SOB or lightheadedness with hypotension: Yes Has patient had a PCN reaction causing severe rash involving mucus membranes or skin necrosis: Yes Has patient had a PCN reaction that required hospitalization Yes Has patient had a PCN reaction occurring within the last 10 years: No If all of the above answers are "NO", then may proceed with Cephalosporin use.   . Sudafed  [Pseudoephedrine Hcl] Hives  . Tetracycline Shortness Of Breath    Other reaction(s): SHORTNESS OF BREATH  . Vancomycin Hives  . Doxycycline  Rash    Other reaction(s): RASH  . Sulfa Antibiotics Rash    Antimicrobials this admission: 4/7 >> LVQ >> 4/12 4/7 >> Fluconazole >> 4/17 4/26 >> Primaxin >>  Previous cultures (2015?) per urology / care everywhere, UTI with pseudomonas (resistant to cipro), enterococcus and candida glabrata  Microbiology results: 4/7 Strep Pneumo / Legionella UAg: Neg / Neg 4/7 BCx: NGF 4/7 UCx: >100K Yeast (U/A turbid, +bacteria, large WBC) 4/22 MRSA PCR (-) 4/22 bcx x2: ngtd 4/22 Trach asp: moderate Pseudomonas (S - cefepime, ceftaz, imip, zosyn, tob) (R- cipro, I - gent) 4/24 Trach asp: abundant GPR, few GNR on gram stain, cx pending  Thank you for allowing pharmacy to be a part of this patient's care.  Loralee PacasErin Ashna Dorough, PharmD, BCPS Pager: [phone] 02/05/2016 10:15 AM

## 2016-02-05 NOTE — Progress Notes (Signed)
ANTICOAGULATION CONSULT NOTE - Follow Up Consult  Pharmacy Consult for Warfarin Indication: atrial fibrillation  Allergies  Allergen Reactions  . Nitroglycerin Anaphylaxis  . Penicillins Anaphylaxis, Hives and Shortness Of Breath    Has patient had a PCN reaction causing immediate rash, facial/tongue/throat swelling, SOB or lightheadedness with hypotension: Yes Has patient had a PCN reaction causing severe rash involving mucus membranes or skin necrosis: Yes Has patient had a PCN reaction that required hospitalization Yes Has patient had a PCN reaction occurring within the last 10 years: No If all of the above answers are "NO", then may proceed with Cephalosporin use.   . Sudafed  [Pseudoephedrine Hcl] Hives  . Tetracycline Shortness Of Breath    Other reaction(s): SHORTNESS OF BREATH  . Vancomycin Hives  . Doxycycline Rash    Other reaction(s): RASH  . Sulfa Antibiotics Rash    Patient Measurements: Height: 6\' 3"  (190.5 cm) Weight: 176 lb 9.4 oz (80.1 kg) IBW/kg (Calculated) : 84.5  Vital Signs: Temp: 98.5 F (36.9 C) (04/26 0804) Temp Source: Oral (04/26 0804) BP: 129/41 mmHg (04/26 0915) Pulse Rate: 59 (04/26 0915)  Labs:  Recent Labs  02/03/16 0325 02/04/16 0341 02/05/16 0324  LABPROT 28.1* 24.7* 25.8*  INR 2.79* 2.25* 2.39*  CREATININE 5.24* 5.96* 5.76*    Estimated Creatinine Clearance: 12 mL/min (by C-G formula based on Cr of 5.76).  Assessment: Patient is a 81 yo M with hx colon cancer and atrial fibrillation on warfarin PTA who is currently admitted for acute renal failure, CAP, and UTI. Pharmacy consulted to assist with dosing warfarin. Lovenox bridging dc'd 4/23 stopped as INR became therapeutic.  Home dose is 5mg  T/R/Sat and 2.5mg  all other days  Note: 5mg  dose not given 4/25 due to concern for aspiration while patient was briefly extubated  Today, 02/05/2016 - INR remains therapeutic at 2.39 despite no dose yesterday - CBC stable on 4/22 - No  bleeding documented - Diet: TF - administering warfarin via NG tube - Drug interactions:   - amiodarone drip started 4/24 AM, but effect on warfarin (increased INR) should not be seen for several weeks  - broad spectrum antibiotic (primaxin) may increase warfarin sensitivity  Goal of Therapy:  INR 2-3   Plan:  - Warfarin 2.5 mg via NGT x1 - Daily INR - Monitor for s/s bleeding  Loralee PacasErin Farra Nikolic, PharmD, BCPS Pager: [phone]  02/05/2016 10:40 AM

## 2016-02-05 NOTE — Procedures (Signed)
Intubation Procedure Note Paul LodgeMax B Guerra [id number] 1935-06-02  Procedure: Intubation Indications: Airway protection and maintenance  Procedure Details Consent: Risks of procedure as well as the alternatives and risks of each were explained to the (patient/caregiver).  Consent for procedure obtained. Time Out: Verified patient identification, verified procedure, site/side was marked, verified correct patient position, special equipment/implants available, medications/allergies/relevent history reviewed, required imaging and test results available.  {time out performed:Performed  Maximum sterile technique was used including gloves, gown, hand hygiene and mask.  MAC    Evaluation Hemodynamic Status: BP stable throughout; O2 sats: stable throughout Patient's Current Condition: stable Complications: No apparent complications Patient did tolerate procedure well. Chest X-Sula Fetterly ordered to verify placement.  CXR: pending.   Ancil LinseyRay, Chioke Noxon Denise 02/05/2016

## 2016-02-05 NOTE — Progress Notes (Addendum)
eLink Physician-Brief Progress Note Patient Name: Paul LodgeMax B Wery DOB: April 01, 1935 MRN: [id number]   Date of Service  02/05/2016  HPI/Events of Note  Bradycardia - HR = 46. BP = 117/39.  eICU Interventions  Will turn off Amiodarone IV infusion and hold Metoprolol.      Intervention Category Major Interventions: Arrhythmia - evaluation and management  Kymani Shimabukuro Eugene 02/05/2016, 5:01 AM

## 2016-02-05 NOTE — Anesthesia Procedure Notes (Signed)
Procedure Name: Intubation Date/Time: 02/05/2016 2:07 AM Performed by: Early OsmondEARGLE, Paul Marcy E Pre-anesthesia Checklist: Patient identified, Emergency Drugs available, Suction available and Patient being monitored Patient Re-evaluated:Patient Re-evaluated prior to inductionOxygen Delivery Method: Ambu bag Preoxygenation: Pre-oxygenation with 100% oxygen Intubation Type: IV induction Ventilation: Mask ventilation without difficulty Laryngoscope Size: Glidescope and 4 Grade View: Grade I Tube type: Oral Tube size: 7.5 mm Number of attempts: 1 Airway Equipment and Method: Stylet and Video-laryngoscopy Placement Confirmation: ETT inserted through vocal cords under direct vision,  breath sounds checked- equal and bilateral and CO2 detector Secured at: 22 cm Tube secured with: Tape Dental Injury: Teeth and Oropharynx as per pre-operative assessment  Comments: Called for elective intubation d/t impending resp. Failure. Patient extubated earlier yesterday. Sedated with 100 mg propofol, easy intubation with glidescope. O2 sats remain upper 90's

## 2016-02-05 NOTE — Progress Notes (Signed)
eLink Physician-Brief Progress Note Patient Name: Paul LodgeMax B Guerra DOB: [birth year] MRN: [id number]   Date of Service  02/05/2016  HPI/Events of Note  Now intubated and ventilated. BP = 88/48. CXR already ordered.   eICU Interventions  Will order: 1. Ventilator orders: 100%/PRVC 16/TV 550/P 5  2. ABG at 3:30 AM. 3. 0.9 NaCl 1 liter IV over 1 hour now.  4. Fentanyl 25-50 mcg IV Q 2 hours PRN sedation.      Intervention Category Major Interventions: Respiratory failure - evaluation and management  Sommer,Steven Eugene 02/05/2016, 2:35 AM

## 2016-02-05 NOTE — Progress Notes (Signed)
Chaplain paged at [phone] to assist in comforting [name]'s daughter, Paul Guerra, who is upset that her father has been intubated again. He had been extubated this morning and seemed to be "better.'  Mr, Burn's daughter, Paul Guerra, is a person of faith, who is struggling with the why her prayers were answered and then were not. Mr Burn has been living with her a short time and she is his primary care provider and the stress of his illness on her is overwhelming presently.  [name] asked for prayer and one was provided at bedside. [name] responded by twitching his shoulder as we prayed.   Please page the chaplain immediately if [name] need or requests further spiritual care. Recommend follow-up by daytime chaplains to further assist family during this time of transition and care.  Benjie Karvonenharles D. Marvion Bastidas, DMin, MDiv Chaplain

## 2016-02-05 NOTE — Progress Notes (Signed)
PULMONARY / CRITICAL CARE MEDICINE   Name: Paul LodgeMax B Gehrig MRN: [id number] DOB: [birth year]    ADMISSION DATE:  01/17/2016  CHIEF COMPLAINT:  Hypoxemic and hypercapnic respiratory failure  INITIAL PRESENTATION:  Patient is intubated and sedated, HPI taken from chart review. [facility]. Patient is a 81 year old male with history of pancreatitis, DM, HTN, recurrent UTI, recurrent kidney stones, atrial fibrillation, and colon cancer (right hemicolectomy). [facility] with [facility] creatinine, probable UTI, and pneumonia. He has been having urinary frequency, dysuria, confusion, and suprapubic abdominal pain over the past month. He saw PCP 2 days prior to admission and was started on ciprofloxacin for UTI, but on his way home his PCP called to tell him to go to ED for IV fluids and antibiotics. Blood pressure was 97/51, CT showed hydronephrosis and kidney stones, and chest x-ray showed pneumonia. Acute renal failure not improving very well, thought to be secondary to ATN.   SIGNIFICANT EVENTS: 4/7 admitted for abd pain/UTI/AKI 4/22 went into resp distress after fluid resuscitation. Intubated. PCCM consulted   SUBJECTIVE:  Now awake and f/c  VITAL SIGNS: Temp:  [98.5 F (36.9 C)-101.5 F (38.6 C)] 98.5 F (36.9 C) (04/26 0804) Pulse Rate:  [50-177] 59 (04/26 0915) Resp:  [11-24] 16 (04/26 0915) BP: (73-177)/(39-107) 129/41 mmHg (04/26 0915) SpO2:  [85 %-100 %] 100 % (04/26 0915) FiO2 (%):  [60 %-100 %] 60 % (04/26 0915) Weight:  [176 lb 9.4 oz (80.1 kg)] 176 lb 9.4 oz (80.1 kg) (04/26 0500) HEMODYNAMICS:   VENTILATOR SETTINGS: Vent Mode:  [-] PRVC FiO2 (%):  [60 %-100 %] 60 % Set Rate:  [16 bmp] 16 bmp Vt Set:  [650 mL] 650 mL PEEP:  [5 cmH20] 5 cmH20 Plateau Pressure:  [15 cmH20-20 cmH20] 15 cmH20 INTAKE / OUTPUT:  Intake/Output Summary (Last 24 hours) at 02/05/16 0952 Last data filed at 02/05/16 0900  Gross per 24 hour  Intake 1440.67 ml   Output   3150 ml  Net -1709.33 ml    PHYSICAL EXAMINATION: General: awake, alert. Passing SBT  Neuro:  Awake, moves all ext. Follows commands rapidly HEENT:  ETT in place  (-) NVD Cardiovascular:  Irregular, no m/r/g.  Lungs:  Clear t/o,  no accessory use.  Abdomen:  Soft, non tender, non distended normal bowel sounds Musculoskeletal:  Normal bulk and tone Skin:  1+ pitting LE edema b/l  LABS:  CBC  Recent Labs Lab 01/30/16 0402 01/31/16 0323 02/01/16 0333  WBC 4.6 5.6 8.9  HGB 8.3* 9.1* 10.3*  HCT 23.9* 26.5* 30.2*  PLT 161 201 241   Coag's  Recent Labs Lab 02/03/16 0325 02/04/16 0341 02/05/16 0324  INR 2.79* 2.25* 2.39*   BMET  Recent Labs Lab 02/03/16 0325 02/04/16 0341 02/05/16 0324  NA 145 147* 147*  K 4.0 4.1 4.2  CL 111 111 109  CO2 20* 21* 21*  BUN 93* 109* 97*  CREATININE 5.24* 5.96* 5.76*  GLUCOSE 273* 293* 219*   Electrolytes  Recent Labs Lab 02/01/16 1949 02/02/16 0336 02/03/16 0015 02/03/16 0325 02/04/16 0341 02/05/16 0324  CALCIUM 8.5* 8.5* 8.3* 8.2* 8.4* 8.5*  MG 1.7  --  1.7  --   --   --   PHOS  --  6.0*  --  5.6* 5.2*  --    Sepsis Markers  Recent Labs Lab 02/01/16 0333 02/01/16 0820  LATICACIDVEN 1.2 2.4*   ABG  Recent Labs Lab 02/01/16 0016 02/01/16 0258  02/05/16 0336  PHART 7.048* 7.232* 7.392  PCO2ART 63.3* 35.8 36.1  PO2ART 88.2 104* 443*   Liver Enzymes  Recent Labs Lab 02/02/16 0336 02/03/16 0325 02/04/16 0341  ALBUMIN 2.7* 2.8* 2.8*   Cardiac Enzymes  Recent Labs Lab 02/02/16 0847  TROPONINI 0.03   Glucose  Recent Labs Lab 02/04/16 1125 02/04/16 1513 02/04/16 1956 02/04/16 2306 02/05/16 0354 02/05/16 0801  GLUCAP 172* 173* 222* 225* 188* 216*    Imaging Dg Chest Port 1 View  02/05/2016  CLINICAL DATA:  Intubation EXAM: PORTABLE CHEST 1 VIEW COMPARISON:  Two days ago FINDINGS: Endotracheal tube tip is just below the clavicular heads. There is an orogastric tube with side port  at the lower esophagus. Progressive opacification of the right middle and lower lobes with air bronchograms and volume loss. Streaky left basilar opacity. No edema, visible effusion, or air leak. IMPRESSION: 1. Endotracheal tube in good position. 2. Orogastric tube with side port at the lower esophagus. Suggest advancement for more secure positioning. 3. Progressive right middle and lower lobe pneumonia and/or atelectasis. Electronically Signed   By: Marnee Spring M.D.   On: 02/05/2016 02:57  R>L airspace disease. Right w/ patchy infiltrate-->worse .    ASSESSMENT / PLAN:   PULMONARY A: Acute hypoxic respiratory failure 2/2 Cardiogenic pumonary edema w/ recurrent respiratory failure the am of 4/26 d/t Pseudomonas pneumonia and deconditioning  P:   Cont full vent support today  F/u am CXR; hope to resume weaning efforts in am  Wean for O2 sat >88 PAD protocol pepcid Oral hygiene   CARDIOVASCULAR A:  Afib- now in CVR Cardiogenic pulmonary edema. Trop I negative Grade I diastolic dysfxn (echo 4/23) Elevated BNP P:  -keep even volume status - cont amio for now - cont coumadin  - cont tele   RENAL A:   AKI s/p stent for right hydro; f/u US 4/24 negative for hydro.  Post ATN diuresis-->scr finally has leveled off.  Metabolic acidosis Mild hypernatremia  -  P:   - hold lasix again today - aim for euvolemia  - cont strict I&O - renal dose meds - gentle free water replacement w/ both OV and via tube route   GASTROINTESTINAL A:   Protein calorie malnutrition in setting of critical illness  P: PPI on vent   Resume tube feeds   HEMATOLOGIC A:   Anemia of chronic disease Anticoagulation   P:  - monitor - coumadin per pharmacy    INFECTIOUS A:   Pseudomonas PNA (RLL) P:   See above; will complete 14d rx  (imipenem d/t allergy)  ENDOCRINE A:   Hyperglycemia   P:   - CBG q 4. Keep CBG < 180 mg% - added scheduled insulin back as we are resuming tube  feeds  NEUROLOGIC A:   Sedated-->resolved P:   RASS goal: 0 - Daily awakenings. - gentle w/ PAD given slow renal clearance    FAMILY: Updated by Dr Delton Coombes 4/26 at bedside  81 yo male with h/o afib, that was admitted w/ AKI and right hydro. He is s/p stent. Got aggressive IV hydration when he appeared to have post-ATN diuresis. This resulted in pulmonary edema and VDRF. Now more awake. He continues to auto-diurese. Looks like he is hitting a plateau from a renal stand-point. He got re-intubated last night. This was likely a mix of what we have now identified as pseudomonas PNA and weakness from active infection. We will cont hemodynamic and supportive care. Have started antibiotics.  No weaning today, but hope we can resume weaning efforts on 4/27. Daughter updated at the bedside.   Simonne Martinet ACNP-BC [facility] Pulmonary/Critical Care Pager # [phone] OR # [phone] if no answer 02/05/2016, 9:52 AM   Attending Note:  I have examined patient, reviewed labs, studies and notes. I have discussed the case with Kreg Shropshire, and I agree with the data and plans as amended above. 81 yo man with hx A Fib and diastolic dysfxn, intubated with B infiltrates in setting A Fib/RVR, volume administration for Acute obstructive uropathy and renal failure. Extubated 4/25 but reintubated this am, poor airway protection and apparent pseudomonal PNA. Renal fxn appears to be rebounding. On my eval he is awake, comfortable, understands the events of last 24h. Has course B breath sounds.  We will cover with imipenem, follow respiratory and renal status. I explained to family today that he is tenuous but that I am still hopeful that this is a reversible process. Will keep them updated.  Independent critical care time is 35 minutes.   Levy Pupa, MD, PhD 02/05/2016, 12:05 PM [facility] [phone] or if no answer [phone]

## 2016-02-05 NOTE — Progress Notes (Signed)
[facility] Rounding Note  Subjective:  Reintubated Sputum cultures + pseudomonas RLL infiltrate Amio stopped d/t bradycardia  Remains awake, alert, following commands, very animated Good UOP  Objective: Vital signs in last 24 hours: Temp:  [98.5 F (36.9 C)-101.5 F (38.6 C)] 98.8 F (37.1 C) (04/26 1200) Pulse Rate:  [50-177] 70 (04/26 1258) Resp:  [15-24] 17 (04/26 1258) BP: (73-177)/(39-86) 150/46 mmHg (04/26 1258) SpO2:  [89 %-100 %] 100 % (04/26 1258) FiO2 (%):  [40 %-100 %] 40 % (04/26 1258) Weight:  [80.1 kg (176 lb 9.4 oz)] 80.1 kg (176 lb 9.4 oz) (04/26 0500) Weight change: -2.1 kg (-4 lb 10.1 oz)  Intake/Output from previous day: 04/25 0701 - 04/26 0700 In: 1435.8 [I.V.:1320.8; NG/GT:25] Out: 3450 [Urine:3450] Intake/Output this shift: Total I/O In: 385 [I.V.:150; NG/GT:235] Out: -    BP 150/46 mmHg  Pulse 70  Temp(Src) 98.8 F (37.1 C) (Axillary)  Resp 17  Ht 6' 3"  (1.905 m)  Wt 80.1 kg (176 lb 9.4 oz)  BMI 22.07 kg/m2  SpO2 100% Intubated, OG in place Very animated, smiling Lungs anteriorly fairly clear S1S2 No S3 GI: soft, non-tender; bowel sounds normal. No focal tenderness Ext: SCD's in place.  No edema of LE's Cloudy urine with sediment in foley tube   Recent Labs  02/04/16 0341 02/05/16 0324  NA 147* 147*  K 4.1 4.2  CL 111 109  CO2 21* 21*  GLUCOSE 293* 219*  BUN 109* 97*  CREATININE 5.96* 5.76*  CALCIUM 8.4* 8.5*     Recent Labs  02/04/16 2306 02/05/16 0354 02/05/16 0801  GLUCAP 225* 188* 216*    Scheduled medications: . antiseptic oral rinse  7 mL Mouth Rinse QID  . chlorhexidine gluconate (SAGE KIT)  15 mL Mouth Rinse BID  . dutasteride  0.5 mg Oral Daily  . feeding supplement (PRO-STAT SUGAR FREE 64)  30 mL Oral BID  . free water  200 mL Per Tube Q8H  . imipenem-cilastatin  250 mg Intravenous Q12H  . insulin aspart  0-15 Units Subcutaneous Q4H  . insulin aspart  8 Units Subcutaneous Q4H  . lanthanum  1,000 mg Per NG  tube TID  . nicotine  14 mg Transdermal QHS  . warfarin  2.5 mg Per NG tube ONCE-1800  . warfarin  5 mg Oral ONCE-1800  . Warfarin - Pharmacist Dosing Inpatient   Does not apply q1800   Infusions . amiodarone Stopped (02/05/16 0512)  . dextrose 5 % 1,000 mL infusion 50 mL/hr at 02/05/16 1000  . feeding supplement (VITAL 1.5 CAL) 1,000 mL (02/05/16 1012)   US Renal  02/03/2016  CLINICAL DATA:  Right hydronephrosis EXAM: RENAL / URINARY TRACT ULTRASOUND COMPLETE COMPARISON:  01/24/2016 and previous FINDINGS: Right Kidney: Length: 10.6 cm. Isoechoic to adjacent liver. 10 x 8 x 9 mm probable cyst, inferior pole. No hydronephrosis. Left Kidney: Length: 9 cm. Echogenicity within normal limits. No mass or hydronephrosis visualized. Bladder: Decompressed by Foley catheter. IMPRESSION: 1. No hydronephrosis. 2. Small right lower pole renal cyst. Electronically Signed   By: Lucrezia Europe M.D.   On: 02/03/2016 15:53   Dg Chest Port 1 View  02/05/2016  CLINICAL DATA:  Intubation EXAM: PORTABLE CHEST 1 VIEW COMPARISON:  Two days ago FINDINGS: Endotracheal tube tip is just below the clavicular heads. There is an orogastric tube with side port at the lower esophagus. Progressive opacification of the right middle and lower lobes with air bronchograms and volume loss. Streaky left basilar opacity. No  edema, visible effusion, or air leak. IMPRESSION: 1. Endotracheal tube in good position. 2. Orogastric tube with side port at the lower esophagus. Suggest advancement for more secure positioning. 3. Progressive right middle and lower lobe pneumonia and/or atelectasis. Electronically Signed   By: Monte Fantasia M.D.   On: 02/05/2016 02:57    4/7 Urine cx + > 100k yeast 4/7 UA - turbic, tntc WBC and RBC, 30 prot, many bact 4/11 UA - cloudy, no bact/ tntc WBC / tntc RBC, 30 prot 4/22 UA - clear rare bact, mod Hb, neg prot, 6-30 rbc/ wbc, yeast +, 1.008  Background HTN >69yr BPH post TURP, colon CA s/p resx, cardiac  arrest in past, recurrent renal stones with lithotripsy x 3 at least, recurrent UTIs, Afib, DM, [place] [facility] [place] [facility] 4 . Last available was 1.12 from 04/2015. Serial Cr's here 3.7 to 3.9 to 4.1 and then 4.6 and we were asked to see.  Had pyuria, R hydronephrosis and ? pneumonia on initial evaluation. Tx  with IVF with bicarb, R double J stent placed 4/10. High FeNa, had been on Fluconazole, Cipro, levaquin. Was also on Lisinopril PTA.Developed resp distress 4/22 after fluid rescusitation and required intubation. Had excellent UOP after that with lasix (2.8-4 liters) but with no improvement in renal function. As of 02/05/16 creatinine finally taking a downward turn, after peaking at 5.96.  Assessment/Plan:  1. AKI (on ? CKD) Recent baseline creatinine not known (1.12 03/2015) - most likely hemodynamically mediated ATN (ACE, low BP's) plus effects of obstruction, then development of  recurrent hypotension, and hemodynamic effects of AF with RVR factored in (02/01/16).  He is non-oliguric, and for the first time since admission creatinine has actually fallen some today, maybe signalling some return of GFR. Remains without dialysis indications. Lasix still on hold, UOP good. Hopeful that this trend will continue!!! 2. Hypernatremia - Stable - with IV D5W at 50 + free water down tube (replaced when reintubated) 3. Resp failure - extubated 4/25 then reintubated 4. PNA - RML/RLL. Sputum cx + pseudomonas->imipenem. (4/26) 5. R hydro s/p stent - repeat [place]24 no residual hydro.  6. AF with RVR - IV amio - back in sinus now. BP stable. 7. Yeast UTI sp rx 8. Pyuria - ? Leftover from infection vs AIN. No eos in urine but PPI stopped anyway at outside chance causing problem (zantac substituted) 9. DM controlled 10. Hx renal stones  CJamal Maes MD [facility] [phone] 02/05/2016, 2:14 PM

## 2016-02-06 ENCOUNTER — Inpatient Hospital Stay (HOSPITAL_COMMUNITY): Payer: Medicare Other

## 2016-02-06 DIAGNOSIS — J151 Pneumonia due to Pseudomonas: Secondary | ICD-10-CM | POA: Insufficient documentation

## 2016-02-06 LAB — CBC
HEMATOCRIT: 25.5 % — AB (ref 39.0–52.0)
HEMOGLOBIN: 8.4 g/dL — AB (ref 13.0–17.0)
MCH: 30 pg (ref 26.0–34.0)
MCHC: 32.9 g/dL (ref 30.0–36.0)
MCV: 91.1 fL (ref 78.0–100.0)
Platelets: 262 10*3/uL (ref 150–400)
RBC: 2.8 MIL/uL — ABNORMAL LOW (ref 4.22–5.81)
RDW: 14.4 % (ref 11.5–15.5)
WBC: 5.6 10*3/uL (ref 4.0–10.5)

## 2016-02-06 LAB — COMPREHENSIVE METABOLIC PANEL
ALBUMIN: 3 g/dL — AB (ref 3.5–5.0)
ALK PHOS: 68 U/L (ref 38–126)
ALT: 32 U/L (ref 17–63)
ANION GAP: 16 — AB (ref 5–15)
AST: 36 U/L (ref 15–41)
BILIRUBIN TOTAL: 0.6 mg/dL (ref 0.3–1.2)
BUN: 103 mg/dL — ABNORMAL HIGH (ref 6–20)
CALCIUM: 8.8 mg/dL — AB (ref 8.9–10.3)
CO2: 22 mmol/L (ref 22–32)
CREATININE: 5.74 mg/dL — AB (ref 0.61–1.24)
Chloride: 108 mmol/L (ref 101–111)
GFR calc Af Amer: 10 mL/min — ABNORMAL LOW (ref >60)
GFR calc non Af Amer: 8 mL/min — ABNORMAL LOW (ref >60)
GLUCOSE: 175 mg/dL — AB (ref 65–99)
Potassium: 3.3 mmol/L — ABNORMAL LOW (ref 3.5–5.1)
Sodium: 146 mmol/L — ABNORMAL HIGH (ref 135–145)
TOTAL PROTEIN: 6.5 g/dL (ref 6.5–8.1)

## 2016-02-06 LAB — GLUCOSE, CAPILLARY
GLUCOSE-CAPILLARY: 129 mg/dL — AB (ref 65–99)
GLUCOSE-CAPILLARY: 155 mg/dL — AB (ref 65–99)
GLUCOSE-CAPILLARY: 159 mg/dL — AB (ref 65–99)
GLUCOSE-CAPILLARY: 266 mg/dL — AB (ref 65–99)
Glucose-Capillary: 168 mg/dL — ABNORMAL HIGH (ref 65–99)

## 2016-02-06 LAB — PROTIME-INR
INR: 3.18 — AB (ref 0.00–1.49)
Prothrombin Time: 32 seconds — ABNORMAL HIGH (ref 11.6–15.2)

## 2016-02-06 LAB — MAGNESIUM: Magnesium: 2.1 mg/dL (ref 1.7–2.4)

## 2016-02-06 LAB — CULTURE, BLOOD (ROUTINE X 2)
CULTURE: NO GROWTH
Culture: NO GROWTH

## 2016-02-06 LAB — PHOSPHORUS: Phosphorus: 5.1 mg/dL — ABNORMAL HIGH (ref 2.5–4.6)

## 2016-02-06 MED ORDER — POTASSIUM CHLORIDE CRYS ER 20 MEQ PO TBCR
30.0000 meq | EXTENDED_RELEASE_TABLET | Freq: Once | ORAL | Status: AC
  Administered 2016-02-06: 30 meq via ORAL
  Filled 2016-02-06: qty 1

## 2016-02-06 MED ORDER — BUDESONIDE 0.5 MG/2ML IN SUSP
0.5000 mg | Freq: Two times a day (BID) | RESPIRATORY_TRACT | Status: DC
Start: 2016-02-06 — End: 2016-02-07
  Administered 2016-02-06 – 2016-02-07 (×2): 0.5 mg via RESPIRATORY_TRACT
  Filled 2016-02-06 (×2): qty 2

## 2016-02-06 MED ORDER — POTASSIUM CHLORIDE 10 MEQ/100ML IV SOLN
10.0000 meq | INTRAVENOUS | Status: AC
  Administered 2016-02-06: 10 meq via INTRAVENOUS
  Filled 2016-02-06 (×2): qty 100

## 2016-02-06 MED ORDER — HALOPERIDOL LACTATE 5 MG/ML IJ SOLN
1.0000 mg | Freq: Once | INTRAMUSCULAR | Status: AC
  Administered 2016-02-06: 1 mg via INTRAVENOUS
  Filled 2016-02-06: qty 1

## 2016-02-06 MED ORDER — CETYLPYRIDINIUM CHLORIDE 0.05 % MT LIQD
7.0000 mL | Freq: Two times a day (BID) | OROMUCOSAL | Status: DC
  Administered 2016-02-06 – 2016-02-15 (×15): 7 mL via OROMUCOSAL

## 2016-02-06 MED ORDER — DEXTROSE-NACL 5-0.2 % IV SOLN
INTRAVENOUS | Status: DC
  Administered 2016-02-06 – 2016-02-07 (×2): via INTRAVENOUS

## 2016-02-06 MED ORDER — METOPROLOL TARTRATE 5 MG/5ML IV SOLN
2.5000 mg | INTRAVENOUS | Status: DC
  Administered 2016-02-06 – 2016-02-07 (×6): 2.5 mg via INTRAVENOUS
  Filled 2016-02-06 (×6): qty 5

## 2016-02-06 MED ORDER — DEXTROSE-NACL 5-0.2 % IV SOLN
INTRAVENOUS | Status: DC
  Filled 2016-02-06: qty 1000

## 2016-02-06 MED ORDER — DEXTROSE 5 % IV SOLN: INTRAVENOUS | Status: DC

## 2016-02-06 MED ORDER — DEXTROSE 5 % IV SOLN
INTRAVENOUS | Status: DC
  Filled 2016-02-06: qty 1000

## 2016-02-06 NOTE — Progress Notes (Signed)
[facility] Rounding Note  Subjective:  Pt self extubated during the night but has tolerated OK Remains awake, alert, following commands, very animated Good UOP - 3.4 liters out yesterday, 2.2 so far today so for 24 hours is behind on fluids (goal is even) No lasix since 4/22  Objective: Vital signs in last 24 hours: Temp:  [97.5 F (36.4 C)-99.1 F (37.3 C)] 98.1 F (36.7 C) (04/27 1224) Pulse Rate:  [51-128] 76 (04/27 1215) Resp:  [14-23] 17 (04/27 1215) BP: (86-187)/(41-99) 119/65 mmHg (04/27 1215) SpO2:  [94 %-100 %] 100 % (04/27 1215) FiO2 (%):  [40 %] 40 % (04/27 0000) Weight:  [79.7 kg (175 lb 11.3 oz)] 79.7 kg (175 lb 11.3 oz) (04/27 0421) Weight change: -0.4 kg (-14.1 oz)  Intake/Output from previous day: 04/26 0701 - 04/27 0700 In: 2506 [I.V.:1100; NG/GT:1206; IV Piggyback:200] Out: 2200 [Urine:2200] Intake/Output this shift: Total I/O In: 200 [IV Piggyback:200] Out: -    BP 119/65 mmHg  Pulse 76  Temp(Src) 98.1 F (36.7 C) (Oral)  Resp 17  Ht 6\' 3"  (1.905 m)  Wt 79.7 kg (175 lb 11.3 oz)  BMI 21.96 kg/m2  SpO2 100%  Very animated, smiling Lungs anteriorly fairly clear, coarse BS with some crackles post S1S2 No S3 Tachy at 120 right now with SBP 130 Abd soft, non-tender + BS Ext: SCD's in place.  I see no LE edema at all Foley yellow urine   Recent Labs  02/05/16 0324 02/06/16 0318  NA 147* 146*  K 4.2 3.3*  CL 109 108  CO2 21* 22  GLUCOSE 219* 175*  BUN 97* 103*  CREATININE 5.76* 5.74*  CALCIUM 8.5* 8.8*     Recent Labs  02/06/16 0406 02/06/16 0735 02/06/16 1139  GLUCAP 168* 159* 129*    Scheduled medications: . antiseptic oral rinse  7 mL Mouth Rinse BID  . budesonide (PULMICORT) nebulizer solution  0.5 mg Nebulization BID  . dutasteride  0.5 mg Oral Daily  . imipenem-cilastatin  250 mg Intravenous Q12H  . insulin aspart  0-15 Units Subcutaneous Q4H  . lanthanum  1,000 mg Per NG tube TID  . metoprolol  2.5 mg Intravenous Q3H  .  Warfarin - Pharmacist Dosing Inpatient   Does not apply q1800   Infusions . amiodarone Stopped (02/05/16 0512)  . dextrose 75 mL (02/06/16 1025)   Dg Chest Port 1 View  02/05/2016  CLINICAL DATA:  Intubation EXAM: PORTABLE CHEST 1 VIEW COMPARISON:  Two days ago FINDINGS: Endotracheal tube tip is just below the clavicular heads. There is an orogastric tube with side port at the lower esophagus. Progressive opacification of the right middle and lower lobes with air bronchograms and volume loss. Streaky left basilar opacity. No edema, visible effusion, or air leak. IMPRESSION: 1. Endotracheal tube in good position. 2. Orogastric tube with side port at the lower esophagus. Suggest advancement for more secure positioning. 3. Progressive right middle and lower lobe pneumonia and/or atelectasis. Electronically Signed   By: Marnee SpringJonathon  Watts M.D.   On: 02/05/2016 02:57    4/7 Urine cx + > 100k yeast 4/7 UA - turbic, tntc WBC and RBC, 30 prot, many bact 4/11 UA - cloudy, no bact/ tntc WBC / tntc RBC, 30 prot 4/22 UA - clear rare bact, mod Hb, neg prot, 6-30 rbc/ wbc, yeast +, 1.008  Background HTN >[age]yr, BPH post TURP, colon CA s/p resx, cardiac arrest in past, recurrent renal stones with lithotripsy x 3 at least, recurrent UTIs, Afib,  DM, presented to [facility] hosp from primary MD with Cr of 4 . Last available was 1.12 from 04/2015. Serial Cr's here 3.7 to 3.9 to 4.1 and then 4.6 and we were asked to see.  Had pyuria, R hydronephrosis and ? pneumonia on initial evaluation. Tx  with IVF with bicarb, R double J stent placed 4/10. High FeNa, had been on Fluconazole, Cipro, levaquin. Was also on Lisinopril PTA.Developed resp distress 4/22 after fluid rescusitation and required intubation. Had excellent UOP after that with lasix (2.8-4 liters) but with no improvement in renal function. As of 02/05/16 creatinine finally taking a downward turn, after peaking at 5.96.  Assessment/Plan:  1. AKI (on ? CKD) Recent  baseline creatinine not known (1.12 03/2015) - most likely hemodynamically mediated ATN (ACE, low BP's) plus effects of obstruction, then development of  recurrent hypotension, and hemodynamic effects of AF with RVR factored in (02/01/16).  He is non-oliguric, and creatinine has levelled off. I'm afraid he may be getting behind on fluids - UOP excellent (>3 liters yesterday) and has had no lasix since 02/01/16. Looks dry to me. Only fluids he is getting = free water as D5W. Will change to D5 1/4 NS at 100 for next 24 hours and reevaluate. Remains without dialysis indications.  2. Hypokalemia - appears was given 1 run of 10 + 40 po today for K 3.3 3. Hypernatremia - Stable - only route for free water IV until taking po (no tube for free water now) 4. Resp failure - extubated 4/25 then reintubated - self extubated last night and tolerating 5. PNA - RML/RLL. Sputum cx + pseudomonas->imipenem. (4/26) 6. R hydro s/p stent - repeat US 4/24 no residual hydro.  7. AF with RVR - IV amio - back in sinus now. BP stable. 8. Yeast UTI sp rx 9. Pyuria - No eos in urine but PPI stopped anyway at outside chance causing problem (zantac substituted) 10. DM controlled 11. Hx renal stones  Camille Bal, MD [facility] [phone] Pager 02/06/2016, 2:56 PM

## 2016-02-06 NOTE — Progress Notes (Addendum)
ANTICOAGULATION CONSULT NOTE - Follow Up Consult  Pharmacy Consult for Warfarin Indication: atrial fibrillation  Allergies  Allergen Reactions  . Nitroglycerin Anaphylaxis  . Penicillins Anaphylaxis, Hives and Shortness Of Breath    Has patient had a PCN reaction causing immediate rash, facial/tongue/throat swelling, SOB or lightheadedness with hypotension: Yes Has patient had a PCN reaction causing severe rash involving mucus membranes or skin necrosis: Yes Has patient had a PCN reaction that required hospitalization Yes Has patient had a PCN reaction occurring within the last 10 years: No If all of the above answers are "NO", then may proceed with Cephalosporin use.   . Sudafed  [Pseudoephedrine Hcl] Hives  . Tetracycline Shortness Of Breath    Other reaction(s): SHORTNESS OF BREATH  . Vancomycin Hives  . Doxycycline Rash    Other reaction(s): RASH  . Sulfa Antibiotics Rash    Patient Measurements: Height: 6\' 3"  (190.5 cm) Weight: 175 lb 11.3 oz (79.7 kg) IBW/kg (Calculated) : 84.5  Vital Signs: Temp: 97.5 F (36.4 C) (04/27 0756) Temp Source: Oral (04/27 0756) BP: 116/68 mmHg (04/27 0600) Pulse Rate: 88 (04/27 0600)  Labs:  Recent Labs  02/04/16 0341 02/05/16 0324 02/06/16 0318  HGB  --   --  8.4*  HCT  --   --  25.5*  PLT  --   --  262  LABPROT 24.7* 25.8* 32.0*  INR 2.25* 2.39* 3.18*  CREATININE 5.96* 5.76* 5.74*    Estimated Creatinine Clearance: 12 mL/min (by C-G formula based on Cr of 5.74).  Assessment: Patient is a 81 yo M with hx colon cancer and atrial fibrillation on warfarin PTA who is currently admitted for acute renal failure, CAP, and UTI. Pharmacy consulted to assist with dosing warfarin. Lovenox bridging dc'd 4/23 stopped as INR became therapeutic.  Home dose is 5mg  T/R/Sat and 2.5mg  all other days  Note: 5mg  dose not given 4/25 due to concern for aspiration while patient was briefly extubated  Today, 02/06/2016 - INR SUPRAtherapeutic at  3.18.  - CBC: Hgb low, pltc WNL - No bleeding documented - Diet: TF - administering warfarin via NG tube - Drug interactions:   - amiodarone drip started 4/24 AM, but effect on warfarin (increased INR) should not be seen for several weeks.  Appears this was turned off 4/26a  - broad spectrum antibiotic (primaxin) may increase warfarin sensitivity  - TF may reduce effects of warfarin  Goal of Therapy:  INR 2-3   Plan:  - No warfarin tonight for supratherapeutic INR - Daily INR - Monitor for s/s bleeding  Juliette Alcideustin Anija Brickner, PharmD, BCPS.   Pager: [phone] 02/06/2016 10:05 AM

## 2016-02-06 NOTE — Progress Notes (Addendum)
PULMONARY / CRITICAL CARE MEDICINE   Name: Paul Guerra B Sivley MRN: [id number] DOB: [birth year]    ADMISSION DATE:  01/17/2016  CHIEF COMPLAINT:  Hypoxemic and hypercapnic respiratory failure  INITIAL PRESENTATION:  Patient is intubated and sedated, HPI taken from chart review. [facility]. Patient is a 81 year old male with history of pancreatitis, DM, HTN, recurrent UTI, recurrent kidney stones, atrial fibrillation, and colon cancer (right hemicolectomy). [facility] with elevated creatinine, probable UTI, and pneumonia. He has been having urinary frequency, dysuria, confusion, and suprapubic abdominal pain over the past month. He saw PCP 2 days prior to admission and was started on ciprofloxacin for UTI, but on his way home his PCP called to tell him to go to ED for IV fluids and antibiotics. Blood pressure was 97/51, CT showed hydronephrosis and kidney stones, and chest x-ray showed pneumonia. Acute renal failure not improving very well, thought to be secondary to ATN.   SIGNIFICANT EVENTS: 4/7 admitted for abd pain/UTI/AKI 4/22 went into resp distress after fluid resuscitation. Intubated. PCCM consulted   SUBJECTIVE:  Now awake and f/c (-) subj complaints Weak cough.  He is hungry  VITAL SIGNS: Temp:  [97.5 F (36.4 C)-99.1 F (37.3 C)] 97.5 F (36.4 C) (04/27 0756) Pulse Rate:  [51-128] 88 (04/27 0600) Resp:  [14-23] 23 (04/27 0600) BP: (104-187)/(41-99) 116/68 mmHg (04/27 0600) SpO2:  [94 %-100 %] 100 % (04/27 0600) FiO2 (%):  [40 %-60 %] 40 % (04/27 0000) Weight:  [175 lb 11.3 oz (79.7 kg)] 175 lb 11.3 oz (79.7 kg) (04/27 0421) HEMODYNAMICS:   VENTILATOR SETTINGS: Vent Mode:  [-] PRVC FiO2 (%):  [40 %-60 %] 40 % Set Rate:  [16 bmp] 16 bmp Vt Set:  [650 mL] 650 mL PEEP:  [5 cmH20] 5 cmH20 Plateau Pressure:  [17 cmH20-18 cmH20] 17 cmH20 INTAKE / OUTPUT:  Intake/Output Summary (Last 24 hours) at 02/06/16 0908 Last data filed at  02/06/16 0600  Gross per 24 hour  Intake   2406 ml  Output   2200 ml  Net    206 ml    PHYSICAL EXAMINATION: General: awake, alert. No distress Neuro:  Awake, moves all ext. Follows commands rapidly; oriented x 2.  HEENT:(-) JVD Cardiovascular:  Irregular, no m/r/g.  Lungs:  Decreased RLL, no accessory use. Some crackles at the bases.  Abdomen:  Soft, non tender, non distended normal bowel sounds Musculoskeletal:  Normal bulk and tone Skin:  1+ pitting LE edema b/l > less  LABS:  CBC  Recent Labs Lab 01/31/16 0323 02/01/16 0333 02/06/16 0318  WBC 5.6 8.9 5.6  HGB 9.1* 10.3* 8.4*  HCT 26.5* 30.2* 25.5*  PLT 201 241 262   Coag's  Recent Labs Lab 02/04/16 0341 02/05/16 0324 02/06/16 0318  INR 2.25* 2.39* 3.18*   BMET  Recent Labs Lab 02/04/16 0341 02/05/16 0324 02/06/16 0318  NA 147* 147* 146*  K 4.1 4.2 3.3*  CL 111 109 108  CO2 21* 21* 22  BUN 109* 97* 103*  CREATININE 5.96* 5.76* 5.74*  GLUCOSE 293* 219* 175*   Electrolytes  Recent Labs Lab 02/01/16 1949  02/03/16 0015 02/03/16 0325 02/04/16 0341 02/05/16 0324 02/06/16 0318  CALCIUM 8.5*  < > 8.3* 8.2* 8.4* 8.5* 8.8*  MG 1.7  --  1.7  --   --   --  2.1  PHOS  --   < >  --  5.6* 5.2*  --  5.1*  < > =  values in this interval not displayed. Sepsis Markers  Recent Labs Lab 02/01/16 0333 02/01/16 0820  LATICACIDVEN 1.2 2.4*   ABG  Recent Labs Lab 02/01/16 0016 02/01/16 0258 02/05/16 0336  PHART 7.048* 7.232* 7.392  PCO2ART 63.3* 35.8 36.1  PO2ART 88.2 104* 443*   Liver Enzymes  Recent Labs Lab 02/03/16 0325 02/04/16 0341 02/06/16 0318  AST  --   --  36  ALT  --   --  32  ALKPHOS  --   --  68  BILITOT  --   --  0.6  ALBUMIN 2.8* 2.8* 3.0*   Cardiac Enzymes  Recent Labs Lab 02/02/16 0847  TROPONINI 0.03   Glucose  Recent Labs Lab 02/05/16 1155 02/05/16 1634 02/05/16 1922 02/05/16 2335 02/06/16 0406 02/06/16 0735  GLUCAP 238* 184* 162* 155* 168* 159*     Imaging No results found.R>L airspace disease. Right w/ patchy infiltrate-->worse .    ASSESSMENT / PLAN:   PULMONARY A: Acute hypoxic respiratory failure 2/2 Cardiogenic pumonary edema w/ recurrent respiratory failure the am of 4/26 d/t Pseudomonas pneumonia and deconditioning  4/27 self extubated  P:   Wean for O2 sat >88 Aggressive pulm toilet  Repeat am CXR  CARDIOVASCULAR A:  Afib- now in CVR Cardiogenic pulmonary edema. Trop I negative Grade I diastolic dysfxn (echo 4/23) Elevated BNP P:  -keep even volume status - cont amio for now - cont coumadin  - cont tele   RENAL A:   AKI s/p stent for right hydro; f/u US 4/24 negative for hydro.  Post ATN diuresis-->scr finally has leveled off.  Metabolic acidosis-->resolved Mild hypernatremia -->improving  hypokalemia -  P:   - hold lasix again today - aim for euvolemia  - cont strict I&O - renal dose meds - gentle free water replacement w/ IV  - replace K  GASTROINTESTINAL A:   Protein calorie malnutrition in setting of critical illness  Concern for dysphagia  P: Dc PPI SLP eval-->if fails will need alt form of nutrition until he is stronger    HEMATOLOGIC A:   Anemia of chronic disease-->hgb has drifted but no evidence of bleeding (suspect that prior lab draws represent hemoconcentration) Anticoagulation   P:  - monitor - coumadin per pharmacy    INFECTIOUS A:   Pseudomonas PNA (RLL) P:   See above; will complete 14d rx  (imipenem d/t allergy). Imipenem started on 4/26 > needs 2weeks IV abx for PSA.   ENDOCRINE A:   Hyperglycemia   P:   - CBG q 4. Keep CBG < 180 mg% - added scheduled insulin when he was back on tubefeeds; cont for now  NEUROLOGIC A:   Sedated-->resolved Physical deconditioning  P:   RASS goal: 0 D/c all sedation  Start PT/OT today Mobilize   FAMILY: No family at bedise.   81 yo male with h/o afib, that was admitted w/ AKI and right hydro. He is s/p stent. Got  aggressive IV hydration when he appeared to have post-ATN diuresis. This resulted in pulmonary edema and VDRF. Now more awake. He continues to auto-diurese. Looks like he is hitting a plateau from a renal stand-point. He got re-intubated last night. This was likely a mix of what we have now identified as pseudomonas PNA and weakness from active infection. We will cont hemodynamic and supportive care. Have started antibiotics. No weaning today, but hope we can resume weaning efforts on 4/27. Daughter updated at the bedside.   Simonne Martinet ACNP-BC [facility]  Pulmonary/Critical Care Pager # [phone] OR # [phone] if no answer 02/06/2016, 9:08 AM   ATTENDING NOTE / ATTESTATION NOTE :   I have discussed the case with the resident/APP  Anders Simmonds  I agree with the resident/APP's  history, physical examination, assessment, and plans.  I have edited the above note and modified it according to our agreed history, physical examination, assessment and plan.       Pollie Meyer, MD 02/06/2016, 9:48 AM [place] [facility] Pager [phone] After 3 pm or if no answer, call [phone]

## 2016-02-06 NOTE — Progress Notes (Addendum)
E-link, Dr. Sung AmabileSimonds, called regarding patient HR of 130-150 and rhythm change to A-fib. RN ordered to give PRN Metoprolol. Will continue to monitor.

## 2016-02-06 NOTE — Plan of Care (Signed)
Problem: Phase II Progression Outcomes Goal: Date pt extubated/weaned off vent Outcome: Completed/Met Date Met:  02/06/16 Self  Extubated 02/05/16 at 0400

## 2016-02-06 NOTE — Progress Notes (Signed)
Patient self extubated at 0350. Patient oxygen saturation at 96% on 2LNC. MD made aware. Will continue to monitor.

## 2016-02-06 NOTE — Progress Notes (Signed)
RT on floor to start rounds when notified by Pt's RN that Pt had self extubated.  Pt maintaining normal range vital signs on 6Lpm nasal cannula.  No respiratory distress noted.  Pt verbal and with a strong productive cough.  RT will continue to monitor as needed.

## 2016-02-06 NOTE — Progress Notes (Signed)
Date:  February 06, 2016 Chart reviewed for concurrent status and case management needs. Will continue to follow patient for changes and needs: extubated and hfnc at 60% [address] Asyia Hornung, BSN, [place]RN3, [place]   [phone]

## 2016-02-06 NOTE — Progress Notes (Signed)
eLink Physician-Brief Progress Note Patient Name: Dorann LodgeMax B Ketchem DOB: [birth year] MRN: [id number]   Date of Service  02/06/2016  HPI/Events of Note  Self extubated. Tolerating it so far. SpO2 adequate on [place] O2  eICU Interventions  Monitor     Intervention Category Intermediate Interventions: Other:  Billy FischerDavid Simonds 02/06/2016, 3:58 AM

## 2016-02-07 DIAGNOSIS — J151 Pneumonia due to Pseudomonas: Secondary | ICD-10-CM | POA: Insufficient documentation

## 2016-02-07 LAB — GLUCOSE, CAPILLARY
GLUCOSE-CAPILLARY: 181 mg/dL — AB (ref 65–99)
GLUCOSE-CAPILLARY: 187 mg/dL — AB (ref 65–99)
GLUCOSE-CAPILLARY: 83 mg/dL (ref 65–99)
Glucose-Capillary: 158 mg/dL — ABNORMAL HIGH (ref 65–99)
Glucose-Capillary: 271 mg/dL — ABNORMAL HIGH (ref 65–99)
Glucose-Capillary: 159 mg/dL — ABNORMAL HIGH (ref 65–99)

## 2016-02-07 LAB — CULTURE, RESPIRATORY W GRAM STAIN

## 2016-02-07 LAB — RENAL FUNCTION PANEL
ALBUMIN: 2.9 g/dL — AB (ref 3.5–5.0)
ANION GAP: 14 (ref 5–15)
BUN: 103 mg/dL — ABNORMAL HIGH (ref 6–20)
CHLORIDE: 110 mmol/L (ref 101–111)
CO2: 22 mmol/L (ref 22–32)
Calcium: 9 mg/dL (ref 8.9–10.3)
Creatinine, Ser: 5.11 mg/dL — ABNORMAL HIGH (ref 0.61–1.24)
GFR, EST AFRICAN AMERICAN: 11 mL/min — AB (ref >60)
GFR, EST NON AFRICAN AMERICAN: 10 mL/min — AB (ref >60)
Glucose, Bld: 239 mg/dL — ABNORMAL HIGH (ref 65–99)
PHOSPHORUS: 4.4 mg/dL (ref 2.5–4.6)
POTASSIUM: 3.7 mmol/L (ref 3.5–5.1)
Sodium: 146 mmol/L — ABNORMAL HIGH (ref 135–145)

## 2016-02-07 LAB — PROTIME-INR
INR: 3.04 — ABNORMAL HIGH (ref 0.00–1.49)
Prothrombin Time: 30.9 seconds — ABNORMAL HIGH (ref 11.6–15.2)

## 2016-02-07 LAB — CULTURE, RESPIRATORY

## 2016-02-07 MED ORDER — ENSURE ENLIVE PO LIQD
237.0000 mL | Freq: Two times a day (BID) | ORAL | Status: DC
  Administered 2016-02-07: 237 mL via ORAL

## 2016-02-07 MED ORDER — HALOPERIDOL LACTATE 5 MG/ML IJ SOLN
5.0000 mg | Freq: Once | INTRAMUSCULAR | Status: AC
  Administered 2016-02-07: 5 mg via INTRAVENOUS
  Filled 2016-02-07: qty 1

## 2016-02-07 MED ORDER — AMITRIPTYLINE HCL 25 MG PO TABS
25.0000 mg | ORAL_TABLET | Freq: Every day | ORAL | Status: DC
  Administered 2016-02-08 – 2016-02-14 (×6): 25 mg via ORAL
  Filled 2016-02-07 (×8): qty 1

## 2016-02-07 MED ORDER — INSULIN GLARGINE 100 UNIT/ML ~~LOC~~ SOLN
10.0000 [IU] | Freq: Every day | SUBCUTANEOUS | Status: DC
  Administered 2016-02-07 – 2016-02-15 (×9): 10 [IU] via SUBCUTANEOUS
  Filled 2016-02-07 (×10): qty 0.1

## 2016-02-07 MED ORDER — AMLODIPINE BESYLATE 5 MG PO TABS
2.5000 mg | ORAL_TABLET | Freq: Every day | ORAL | Status: DC
  Administered 2016-02-07 – 2016-02-11 (×5): 2.5 mg via ORAL
  Filled 2016-02-07 (×6): qty 1

## 2016-02-07 MED ORDER — METOPROLOL TARTRATE 50 MG PO TABS
50.0000 mg | ORAL_TABLET | Freq: Two times a day (BID) | ORAL | Status: DC
  Administered 2016-02-07 – 2016-02-09 (×4): 50 mg via ORAL
  Filled 2016-02-07: qty 2
  Filled 2016-02-07: qty 1
  Filled 2016-02-07: qty 2
  Filled 2016-02-07 (×3): qty 1

## 2016-02-07 MED ORDER — DEXTROSE-NACL 5-0.2 % IV SOLN
INTRAVENOUS | Status: DC
  Administered 2016-02-07 – 2016-02-13 (×10): via INTRAVENOUS

## 2016-02-07 MED ORDER — LORAZEPAM 2 MG/ML IJ SOLN
0.5000 mg | Freq: Once | INTRAMUSCULAR | Status: AC
  Administered 2016-02-07: 0.5 mg via INTRAVENOUS
  Filled 2016-02-07: qty 1

## 2016-02-07 MED ORDER — INSULIN ASPART 100 UNIT/ML ~~LOC~~ SOLN
0.0000 [IU] | Freq: Three times a day (TID) | SUBCUTANEOUS | Status: DC
  Administered 2016-02-07: 3 [IU] via SUBCUTANEOUS
  Administered 2016-02-07: 8 [IU] via SUBCUTANEOUS
  Administered 2016-02-08: 5 [IU] via SUBCUTANEOUS
  Administered 2016-02-08: 3 [IU] via SUBCUTANEOUS
  Administered 2016-02-08: 2 [IU] via SUBCUTANEOUS
  Administered 2016-02-09: 5 [IU] via SUBCUTANEOUS
  Administered 2016-02-09 – 2016-02-10 (×4): 3 [IU] via SUBCUTANEOUS
  Administered 2016-02-10 – 2016-02-11 (×2): 5 [IU] via SUBCUTANEOUS
  Administered 2016-02-11: 8 [IU] via SUBCUTANEOUS
  Administered 2016-02-11: 5 [IU] via SUBCUTANEOUS
  Administered 2016-02-11 – 2016-02-12 (×2): 3 [IU] via SUBCUTANEOUS
  Administered 2016-02-12: 5 [IU] via SUBCUTANEOUS
  Administered 2016-02-12 – 2016-02-13 (×4): 3 [IU] via SUBCUTANEOUS
  Administered 2016-02-13: 5 [IU] via SUBCUTANEOUS
  Administered 2016-02-13: 3 [IU] via SUBCUTANEOUS
  Administered 2016-02-14 (×2): 8 [IU] via SUBCUTANEOUS
  Administered 2016-02-14: 2 [IU] via SUBCUTANEOUS
  Administered 2016-02-14: 3 [IU] via SUBCUTANEOUS
  Administered 2016-02-15: 8 [IU] via SUBCUTANEOUS

## 2016-02-07 MED ORDER — ATORVASTATIN CALCIUM 10 MG PO TABS
20.0000 mg | ORAL_TABLET | Freq: Every day | ORAL | Status: DC
  Administered 2016-02-08 – 2016-02-14 (×6): 20 mg via ORAL
  Filled 2016-02-07 (×8): qty 2

## 2016-02-07 MED ORDER — GLUCERNA SHAKE PO LIQD
237.0000 mL | Freq: Three times a day (TID) | ORAL | Status: DC
  Administered 2016-02-07 – 2016-02-13 (×9): 237 mL via ORAL
  Filled 2016-02-07 (×26): qty 237

## 2016-02-07 NOTE — Evaluation (Signed)
Physical Therapy Evaluation Patient Details Name: Paul Guerra MRN: [id number] DOB: [birth year] Today's Date: 02/07/2016   History of Present Illness  pt admitted to the hosptial  with acute renal failure  on 01/17/16. On 02/01/16 transfer to ICU forb a fib and  VDRF. Self extubated 02/05/16.  Clinical Impression  The patient tolerated stand and pivot to the recliner and stood a second time. The daughter present and very supportive and indicates plans for Home with services. HR 76-, sats 93% RA , BP 158/71 after transfer to recliner.     Follow Up Recommendations Home health PT;Supervision/Assistance - 24 hour (SNF if progress is slow. and daughter  agreeable)    Geophysical data processor (measurements PT) may  Need for decreased endurance for DC home, daughter reports that patient is planning to get a scooter.    Recommendations for Other Services OT consult     Precautions / Restrictions Precautions Precautions: Fall Precaution Comments: monitor VS Restrictions Weight Bearing Restrictions: No      Mobility  Bed Mobility Overal bed mobility: Needs Assistance       Supine to sit: Supervision;HOB elevated     General bed mobility comments: extra time, to self mobilize, cues for legs around the bed rail  Transfers Overall transfer level: Needs assistance Equipment used: Rolling walker (2 wheeled) Transfers: Sit to/from Stand Sit to Stand: Mod assist;+2 safety/equipment Stand pivot transfers: Mod assist;+2 safety/equipment       General transfer comment: multimodal cues, assist to power up to stabnd from bed and from the recliner. small shuffle steps with knees flexed to pivot to the recliner.  cues for hands to reach back, decreased control of descent.  Ambulation/Gait                Stairs            Wheelchair Mobility    Modified Rankin (Stroke Patients Only)       Balance Overall balance assessment: Needs assistance Sitting-balance  support: Feet supported;Bilateral upper extremity supported Sitting balance-Leahy Scale: Fair     Standing balance support: During functional activity;Bilateral upper extremity supported Standing balance-Leahy Scale: Poor                               Pertinent Vitals/Pain Pain Assessment: No/denies pain    Home Living Family/patient expects to be discharged to:: Private residence     Type of Home: Mobile home Home Access: Stairs to enter   [employer] of Steps: to have ramp built and to get a scooter. Home Layout: One level Home Equipment: Walker - 2 wheels Additional Comments: daughter wants to have more home health services     Prior Function Level of Independence: Independent         Comments: priot to admission, driving     Hand Dominance        Extremity/Trunk Assessment   Upper Extremity Assessment: Generalized weakness           Lower Extremity Assessment: Generalized weakness      Cervical / Trunk Assessment: Kyphotic  Communication   Communication:  (voice is very soft and difficult to understand. Daughter seem s to understand him better.)  Cognition Arousal/Alertness: Awake/alert Behavior During Therapy: WFL for tasks assessed/performed Overall Cognitive Status: Impaired/Different from baseline Area of Impairment: Memory;Safety/judgement;Awareness Orientation Level: Disoriented to       Safety/Judgement: Decreased awareness of safety     General  Comments: stated Paul GainerMoses Guerra,     General Comments      Exercises        Assessment/Plan    PT Assessment Patient needs continued PT services  PT Diagnosis Difficulty walking;Generalized weakness   PT Problem List Decreased strength;Decreased activity tolerance;Decreased balance;Decreased mobility  PT Treatment Interventions DME instruction;Gait training;Stair training;Functional mobility training;Therapeutic activities;Therapeutic exercise;Balance  training;Neuromuscular re-education;Patient/family education   PT Goals (Current goals can be found in the Care Plan section) Acute Rehab PT Goals Patient Stated Goal: to go home  PT Goal Formulation: With family Time For Goal Achievement: 02/21/16 Potential to Achieve Goals: Good    Frequency Min 3X/week   Barriers to discharge   daughter reports that  she plans to have caregivers, not certain who that will be besides herself. wants HH therapies. does not want SNF apparantly.    Co-evaluation               End of Session Equipment Utilized During Treatment: Gait belt Activity Tolerance: Patient tolerated treatment well Patient left: in chair;with call bell/phone within reach;with chair alarm set;with family/visitor present Nurse Communication: Mobility status         Time: [id number] PT Time Calculation (min) (ACUTE ONLY): 23 min   Charges:   PT Evaluation $PT Eval Moderate Complexity: 1 Procedure PT Treatments $Therapeutic Activity: 8-22 mins   PT G Codes:        Rada HayHill, Schelly Chuba Elizabeth 02/07/2016, 10:16 AM Blanchard KelchKaren Gyan Cambre PT [phone]

## 2016-02-07 NOTE — Progress Notes (Signed)
eLink Physician-Brief Progress Note Patient Name: Paul Guerra DOB: 09/12/1935 MRN: [id number]   Date of Service  02/07/2016  HPI/Events of Note  Call from bedside nurse that patient is confused, pulling at lines including foley and refusing medications/telemetry.  QTc is less than 500.  HD stable.  eICU Interventions  Plan: 5 mg Haldol IV times one Nurse [facility] in 30 minutes if patient remains confused      Intervention Category Major Interventions: Delirium, psychosis, severe agitation - evaluation and management  Nicola Heinemann 02/07/2016, 11:02 PM

## 2016-02-07 NOTE — Progress Notes (Signed)
Inpatient Diabetes Program Recommendations  AACE/ADA: New Consensus Statement on Inpatient Glycemic Control (2015)  Target Ranges:  Prepandial:   less than 140 mg/dL      Peak postprandial:   less than 180 mg/dL (1-2 hours)      Critically ill patients:  140 - 180 mg/dL   Review of Glycemic Control  Results for Gest, Audria NineMAX B (MRN [id number]) as of 02/07/2016 13:21  Ref. Range 02/06/2016 19:16 02/06/2016 23:32 02/07/2016 03:49 02/07/2016 08:07 02/07/2016 13:00  Glucose-Capillary Latest Ref Range: 65-99 mg/dL 829266 (H) 562187 (H) 130181 (H) 158 (H) 271 (H)  Results for Hanna, Audria NineMAX B (MRN [id number]) as of 02/07/2016 13:21  Ref. Range 01/17/2016 16:36  Hemoglobin A1C Latest Ref Range: 4.8-5.6 % 9.7 (H)   Post-prandial blood sugar elevated.  Inpatient Diabetes Program Recommendations:    Add Novolog 4 units tidwc for meal coverage insulin. (Pt on Humalog 7 units tidwc at home)  Will continue to follow. Thank you. Ailene Ardshonda Latara Micheli, RD, LDN, CDE Inpatient Diabetes Coordinator [phone]

## 2016-02-07 NOTE — Progress Notes (Signed)
ANTICOAGULATION CONSULT NOTE - Follow Up Consult  Pharmacy Consult for Warfarin Indication: atrial fibrillation  Allergies  Allergen Reactions  . Nitroglycerin Anaphylaxis  . Penicillins Anaphylaxis, Hives and Shortness Of Breath    Has patient had a PCN reaction causing immediate rash, facial/tongue/throat swelling, SOB or lightheadedness with hypotension: Yes Has patient had a PCN reaction causing severe rash involving mucus membranes or skin necrosis: Yes Has patient had a PCN reaction that required hospitalization Yes Has patient had a PCN reaction occurring within the last 10 years: No If all of the above answers are "NO", then may proceed with Cephalosporin use.   . Sudafed  [Pseudoephedrine Hcl] Hives  . Tetracycline Shortness Of Breath    Other reaction(s): SHORTNESS OF BREATH  . Vancomycin Hives  . Doxycycline Rash    Other reaction(s): RASH  . Sulfa Antibiotics Rash    Patient Measurements: Height: 6\' 3"  (190.5 cm) Weight: 169 lb 12.1 oz (77 kg) IBW/kg (Calculated) : 84.5  Vital Signs: Temp: 97.8 F (36.6 C) (04/28 0800) Temp Source: Oral (04/28 0800) BP: 188/80 mmHg (04/28 0630) Pulse Rate: 74 (04/28 0630)  Labs:  Recent Labs  02/05/16 0324 02/06/16 0318 02/07/16 0324  HGB  --  8.4*  --   HCT  --  25.5*  --   PLT  --  262  --   LABPROT 25.8* 32.0* 30.9*  INR 2.39* 3.18* 3.04*  CREATININE 5.76* 5.74* 5.11*    Estimated Creatinine Clearance: 13 mL/min (by C-G formula based on Cr of 5.11).  Assessment: Patient is a 81 yo M with hx colon cancer and atrial fibrillation on warfarin PTA who is currently admitted for acute renal failure, CAP, and UTI. Pharmacy consulted to assist with dosing warfarin. Lovenox bridging dc'd 4/23 stopped as INR became therapeutic.  Home dose is 5mg  T/R/Sat and 2.5mg  all other days  Note: 5mg  dose not given 4/25 due to concern for aspiration while patient was briefly extubated  Today, 02/07/2016 - INR SUPRAtherapeutic at  3.04, slight improvement from yesterday - CBC: Hgb low, pltc WNL (4/27) - No bleeding documented - Diet: TF off s/p extubation - Drug interactions:   - amiodarone drip started 4/24 AM, but effect on warfarin (increased INR) should not be seen for several weeks.  Appears this was turned off 4/26a  - broad spectrum antibiotic (primaxin) may increase warfarin sensitivity  Goal of Therapy:  INR 2-3   Plan:  - Hold warfarin again tonight for supratherapeutic INR - Daily INR - Monitor for s/s bleeding  Loralee PacasErin Korinna Tat, PharmD, BCPS Pager: [phone]  02/07/2016 9:41 AM

## 2016-02-07 NOTE — Progress Notes (Signed)
Patient ID: Paul Guerra, male   DOB: [birth year], 81 y.o.   MRN: [id number]  [facility] Progress Note    Assessment/ Plan:   1. AKI possibly on chronic kidney disease stage III: Suspected to be hemodynamically mediated ischemic ATN with relative hypotension/A. Fib with RVR in the face of ongoing ACE inhibitor plus/minus obstruction. He remains nonoliguric and improvement of creatinine noted overnight.  he does not have any acute electrolyte abnormalities to prompt intervention and does not have uremic signs or symptoms. Fluid wise, appears to be fairly matched. We'll continue to monitor him closely on current therapy for continued recovery/need for intervention. 2.  Hypernatremia: Encouraged oral intake of fluid-continue to monitor for need to start hypotonic intravenous fluids. 3. Right middle lobe/right lower lobe pneumonia with sputum cultures positive for Pseudomonas: On Primaxin 4. History of right hydronephrosis status post stent with chronic history of nephrolithiasis 5. Atrial fibrillation: Transient RVR-now back in sinus rhythm   Subjective:   Reports to be feeling better-denies any chest pain or shortness of breath    Objective:   BP 173/62 mmHg  Pulse 55  Temp(Src) 98.3 F (36.8 C) (Oral)  Resp 20  Ht  (1.905 m)  Wt 77 kg (169 lb 12.1 oz)  BMI 21.22 kg/m2  SpO2 96%  Intake/Output Summary (Last 24 hours) at 02/07/16 1335 Last data filed at 02/07/16 1200  Gross per 24 hour  Intake 2660.42 ml  Output   3650 ml  Net -989.58 ml   Weight change: -2.7 kg (-5 lb 15.2 oz)  Physical Exam: WUJ:[id number] resting in chair  CVS: Pulse regular rhythm, normal rate  Resp: Clear to auscultation, no rales  Abd: Soft, flat, nontender  Ext: No edema   Imaging: No results found.  Labs: BMET  Recent Labs Lab 02/01/16 0333  02/02/16 0336 02/03/16 0015 02/03/16 0325 02/04/16 0341 02/05/16 0324 02/06/16 0318 02/07/16 0324  NA 143  < > 144 144 145 147*  147* 146* 146*  K 5.6*  < > 4.4 4.2 4.0 4.1 4.2 3.3* 3.7  CL 117*  < > 112* 112* 111 111 109 108 110  CO2 16*  < > 19* 20* 20* 21* 21* 22 22  GLUCOSE 188*  < > 230* 322* 273* 293* 219* 175* 239*  BUN 53*  < > 72* 91* 93* 109* 97* 103* 103*  CREATININE 3.89*  < > 4.67* 5.13* 5.24* 5.96* 5.76* 5.74* 5.11*  CALCIUM 8.1*  < > 8.5* 8.3* 8.2* 8.4* 8.5* 8.8* 9.0  PHOS 6.1*  --  6.0*  --  5.6* 5.2*  --  5.1* 4.4  < > = values in this interval not displayed. CBC  Recent Labs Lab 02/01/16 0333 02/06/16 0318  WBC 8.9 5.6  HGB 10.3* 8.4*  HCT 30.2* 25.5*  MCV 88.6 91.1  PLT 241 262    Medications:    . amitriptyline  25 mg Oral QHS  . amLODipine  2.5 mg Oral Daily  . antiseptic oral rinse  7 mL Mouth Rinse BID  . atorvastatin  20 mg Oral q1800  . dutasteride  0.5 mg Oral Daily  . feeding supplement (ENSURE ENLIVE)  237 mL Oral BID BM  . imipenem-cilastatin  250 mg Intravenous Q12H  . insulin aspart  0-15 Units Subcutaneous TID AC & HS  . insulin glargine  10 Units Subcutaneous Daily  . lanthanum  1,000 mg Per NG tube TID  . metoprolol tartrate  50 mg Oral BID  .  Warfarin - Pharmacist Dosing Inpatient   Does not apply [id number]      Zetta BillsJay Korra Christine, MD 02/07/2016, 1:35 PM

## 2016-02-07 NOTE — Progress Notes (Signed)
PULMONARY / CRITICAL CARE MEDICINE   Name: Paul Guerra MRN: [id number] DOB: September 07, 1935    ADMISSION DATE:  01/17/2016  CHIEF COMPLAINT:  Hypoxemic and hypercapnic respiratory failure  INITIAL PRESENTATION:  Patient is intubated and sedated, HPI taken from chart review. Transfer from [facility] on 01/17/16. Patient is a 81 year old male with history of pancreatitis, DM, HTN, recurrent UTI, recurrent kidney stones, atrial fibrillation, and colon cancer (right hemicolectomy). Presented to [facility] ED with elevated creatinine, probable UTI, and pneumonia. He has been having urinary frequency, dysuria, confusion, and suprapubic abdominal pain over the past month. He saw PCP 2 days prior to admission and was started on ciprofloxacin for UTI, but on his way home his PCP called to tell him to go to ED for IV fluids and antibiotics. Blood pressure was 97/51, CT showed hydronephrosis and kidney stones, and chest x-ray showed pneumonia. Acute renal failure not improving very well, thought to be secondary to ATN.   SIGNIFICANT EVENTS: 4/7 admitted for abd pain/UTI/AKI 4/22 went into resp distress after fluid resuscitation. Intubated. PCCM consulted 4/25 extubated 4/26 re-intubated. Sputum growing pseudomonas. ABX started.  4/27 self extubated.   SUBJECTIVE:  Looks and feels better Eating.   VITAL SIGNS: Temp:  [97.5 F (36.4 C)-98.5 F (36.9 C)] 97.8 F (36.6 C) (04/28 0800) Pulse Rate:  [59-123] 74 (04/28 0630) Resp:  [13-22] 18 (04/28 0630) BP: (118-188)/(48-88) 188/80 mmHg (04/28 0630) SpO2:  [89 %-100 %] 95 % (04/28 0630) Weight:  [169 lb 12.1 oz (77 kg)] 169 lb 12.1 oz (77 kg) (04/28 0402)  Room air  HEMODYNAMICS:   VENTILATOR SETTINGS:   INTAKE / OUTPUT:  Intake/Output Summary (Last 24 hours) at 02/07/16 0929 Last data filed at 02/07/16 0800  Gross per 24 hour  Intake 2260.42 ml  Output   3475 ml  Net -1214.58 ml    PHYSICAL EXAMINATION: General: awake, alert. No  distress; up in chair . Fair cough Neuro:  Awake, moves all ext. Follows commands rapidly; oriented x 2-3.  HEENT:(-) JVD; MMM Cardiovascular:  Irregular, no m/r/g.  Lungs:  Decreased RLL, no accessory use. Some crackles at the bases.  Abdomen:  Soft, non tender, non distended normal bowel sounds Musculoskeletal:  Normal bulk and tone Skin:  1+ pitting LE edema b/l > less  LABS:  CBC  Recent Labs Lab 02/01/16 0333 02/06/16 0318  WBC 8.9 5.6  HGB 10.3* 8.4*  HCT 30.2* 25.5*  PLT 241 262   Coag's  Recent Labs Lab 02/05/16 0324 02/06/16 0318 02/07/16 0324  INR 2.39* 3.18* 3.04*   BMET  Recent Labs Lab 02/05/16 0324 02/06/16 0318 02/07/16 0324  NA 147* 146* 146*  K 4.2 3.3* 3.7  CL 109 108 110  CO2 21* 22 22  BUN 97* 103* 103*  CREATININE 5.76* 5.74* 5.11*  GLUCOSE 219* 175* 239*   Electrolytes  Recent Labs Lab 02/01/16 1949  02/03/16 0015  02/04/16 0341 02/05/16 0324 02/06/16 0318 02/07/16 0324  CALCIUM 8.5*  < > 8.3*  < > 8.4* 8.5* 8.8* 9.0  MG 1.7  --  1.7  --   --   --  2.1  --   PHOS  --   < >  --   < > 5.2*  --  5.1* 4.4  < > = values in this interval not displayed. Sepsis Markers  Recent Labs Lab 02/01/16 0333 02/01/16 0820  LATICACIDVEN 1.2 2.4*   ABG  Recent Labs Lab 02/01/16 0016 02/01/16 0258  02/05/16 0336  PHART 7.048* 7.232* 7.392  PCO2ART 63.3* 35.8 36.1  PO2ART 88.2 104* 443*   Liver Enzymes  Recent Labs Lab 02/04/16 0341 02/06/16 0318 02/07/16 0324  AST  --  36  --   ALT  --  32  --   ALKPHOS  --  68  --   BILITOT  --  0.6  --   ALBUMIN 2.8* 3.0* 2.9*   Cardiac Enzymes  Recent Labs Lab 02/02/16 0847  TROPONINI 0.03   Glucose  Recent Labs Lab 02/06/16 0735 02/06/16 1139 02/06/16 1916 02/06/16 2332 02/07/16 0349 02/07/16 0807  GLUCAP 159* 129* 266* 187* 181* 158*    Imaging No results found.R>L airspace disease. Right w/ patchy infiltrate-->worse .    ASSESSMENT /  PLAN:   PULMONARY A: Acute hypoxic respiratory failure 2/2 Cardiogenic pumonary edema w/ recurrent respiratory failure the am of 4/26 d/t Pseudomonas pneumonia and deconditioning  4/27 self extubated --> now on room air  P:   Wean for O2 sat >88 Aggressive pulm toilet  IS Repeat am CXR Cont abx for PSA PNA  CARDIOVASCULAR A:  Afib- now in CVR Cardiogenic pulmonary edema. Trop I negative Grade I diastolic dysfxn (echo 4/23) Elevated BNP P:  -keep even volume status - cont home lopressor - cont coumadin  - cont tele  - add back low dose Norvasc   RENAL A:   AKI s/p stent for right hydro; f/u US 4/24 negative for hydro.  Post ATN diuresis-->scr finally has leveled off.  Metabolic acidosis-->resolved Mild hypernatremia -->improving  hypokalemia -  P:   - hold lasix again today - aim for euvolemia; no change in current D5W @ 100 as was negative balance for last 24 hrs.  - cont strict I&O - renal dose meds - replace K  GASTROINTESTINAL A:   Protein calorie malnutrition in setting of critical illness  P: Adv diet as tol    HEMATOLOGIC A:   Anemia of chronic disease-->hgb has drifted but no evidence of bleeding (suspect that prior lab draws represent hemoconcentration) Anticoagulation   P:  - monitor - coumadin per pharmacy    INFECTIOUS A:   Pseudomonas PNA (RLL) P:   See above; will complete 14d rx  (imipenem d/t allergy). Imipenem started on 4/26 > needs 2weeks IV abx for PSA.   ENDOCRINE A:   Hyperglycemia   P:   - CBG to change AC/HS. - add back his lantus and titrate as needed.   NEUROLOGIC A:   Mild delirium; required haldol 4/27 Physical deconditioning  Insomnia  P:   Start PT/OT today Mobilize  Frequent re-orienting  Try to re-establish day/night schedule  Will resume home dose of elavil to help with insomnia. Daughter mentioned about ativan 0.5 mg hs which pt has been on for 5 yrs at least. Consider adding lower ativan dose at hs if  elavil does not help him.   FAMILY: No family at bedise.   81 yo male with h/o afib, that was admitted w/ AKI and right hydro. He is s/p stent. Got aggressive IV hydration when he appeared to have post-ATN diuresis. This resulted in pulmonary edema and VDRF. Now more awake. He continues to auto-diurese. Looks like he is hitting a plateau from a renal stand-point. He got re-intubated last night. This was likely a mix of what we have now identified as pseudomonas PNA and weakness from active infection. He self extubated 4/27. He has been making slow progress. As of 4/28: he  is now on room air, more oriented, tolerating a diet and his renal fxn is improving. Plan for today: cont imipenem and pulm toilet (needs 14d total rx), cont PT/OT and mobilize, cont tele, cont free water replacement for hypernatremia (need to keep him euvolemic and avoid water imbalance). PCCM will sign off he is cleared for transfer to SDU. Pt will be under TH starting 4/29.   Simonne Martinet ACNP-BC [facility] Pulmonary/Critical Care Pager # [phone] OR # [phone] if no answer 02/07/2016, 9:29 AM  ATTENDING NOTE / ATTESTATION NOTE :   I have discussed the case with the resident/APP  Anders Simmonds  I agree with the resident/APP's  history, physical examination, assessment, and plans.  I have edited the above note and modified it according to our agreed history, physical examination, assessment and plan.    Family :Family updated at length today.  Discussed plan with daughter.    Pollie Meyer, MD 02/07/2016, 10:08 AM [facility] Pager [phone] After 3 pm or if no answer, call [phone]

## 2016-02-07 NOTE — Progress Notes (Signed)
Patient continues to be agitated, combative, and uncooperative. Attempted to give patient his meds crushed in applesauce, he took one bite and refused the rest. Constantly grabbing at this RN and the nurse tech, Angelique Blonderenise. Will not allow us to place his telemetry back on, will not allow me to connect him to his IV fluids/antibiotics (per day shift RN, he snapped the tubing in half on day shift). Pulling at his foley catheter and everything that is connected to him, therefore I placed protective mittens on patient, but he took them off. Offered pt food and drink, refused both. Orders received from MD, will carry out and continue to monitor pt.

## 2016-02-07 NOTE — Progress Notes (Signed)
Pt continues to refuse telemetry. Daughter at bedside and aware. Dr. Christene Slatese Dios paged at 1300 and 1332 to make aware.  Delford FieldGagliano, Tonilynn Bieker E, RN

## 2016-02-07 NOTE — Progress Notes (Signed)
Pt admitted to 1438 from ICU. Refusing to have tele leads applied. Daughter present and encouraging pt to allow tele to be applied. Will attempt to apply leads in a few minutes.  Delford FieldGagliano, Laverta Harnisch E, RN

## 2016-02-07 NOTE — Progress Notes (Signed)
Nutrition Follow-up  DOCUMENTATION CODES:   Non-severe (moderate) malnutrition in context of acute illness/injury  INTERVENTION:  -Ensure Enlive po BID, each supplement provides 350 kcal and 20 grams of protein -RD continue to monitor  NUTRITION DIAGNOSIS:   Increased nutrient needs related to chronic illness as evidenced by estimated needs. New Dx GOAL:   Patient will meet greater than or equal to 90% of their needs Meeting MONITOR:   PO intake, Diet advancement, Labs, Weight trends, I & O's  REASON FOR ASSESSMENT:   Consult, Ventilator Enteral/tube feeding initiation and management  ASSESSMENT:    81 y.o. male patient with history of chronic pancreatitis, diabetes mellitus, BPH, chronic anemia, remote history of colon cancer status post right hemicolectomy, hypertension, hyperlipidemia, history of recurrent UTIs, history of recurrent kidney stones, history of atrial fibrillation on chronic anticoagulation with Coumadin who [facility] with a elevated creatinine 4 baseline approximately 1.1 and probable UTI and pneumonia. CT stone study protocol which was done showed a right hydronephrosis and concern for obstruction. [facility] spoke with Dr. Thad Rangereynolds urology who accepted the patient in consult. It was noted that patient also had an episode of hypoglycemia which has since resolved. INR there was 4.2. Patient was also noted to have a metabolic acidosis felt to be secondary to acute renal failure.  Paul Guerra is transferring upstairs today.  PO intake 100% Pt was eating during follow-up visit - 100% of tray consumed. Weight fluctuating since admission from 67 kg - 83kg - 77kg Denies nausea/vomiting Denies chewing/swallowing problems  RD will continue to monitor for PO intake.  Labs and Medications reviewed. D5 @ 11600mL/hr -> 408 calories  Diet Order:  Diet Carb Modified Fluid consistency:: Thin; Room service appropriate?:  Yes  Skin:  Wound (see comment) (closed perinieal incision)  Last BM:  PTA  Height:   Ht Readings from Last 1 Encounters:  02/05/16 6\' 3"  (1.905 m)    Weight:   Wt Readings from Last 1 Encounters:  02/07/16 169 lb 12.1 oz (77 kg)    Ideal Body Weight:  89.1 kg  BMI:  Body mass index is 21.22 kg/(m^2).  Estimated Nutritional Needs:   Kcal:  2000-2200  Protein:  105-115g  Fluid:  2L/day  EDUCATION NEEDS:   No education needs identified at this time  Dionne AnoWilliam M. Jsiah Menta, MS, RD LDN After Hours/Weekend Pager [phone]

## 2016-02-07 NOTE — Progress Notes (Addendum)
Pt physically combative with this RN and NT SenegalSimone and Shajuana. Broke IV tubing and pulled off tele leads. T/C to Dr. Solon AugustaBynum. Order obtained for Ativan 0.5mg  IV x1.   Delford FieldGagliano, Ayren Zumbro E, RN

## 2016-02-08 ENCOUNTER — Inpatient Hospital Stay (HOSPITAL_COMMUNITY): Payer: Medicare Other

## 2016-02-08 DIAGNOSIS — E440 Moderate protein-calorie malnutrition: Secondary | ICD-10-CM

## 2016-02-08 LAB — URINALYSIS, ROUTINE W REFLEX MICROSCOPIC
BILIRUBIN URINE: NEGATIVE
GLUCOSE, UA: 250 mg/dL — AB
KETONES UR: NEGATIVE mg/dL
NITRITE: NEGATIVE
PH: 7 (ref 5.0–8.0)
Protein, ur: 30 mg/dL — AB
Specific Gravity, Urine: 1.012 (ref 1.005–1.030)

## 2016-02-08 LAB — URINE MICROSCOPIC-ADD ON

## 2016-02-08 LAB — BASIC METABOLIC PANEL
Anion gap: 15 (ref 5–15)
BUN: 88 mg/dL — AB (ref 6–20)
CALCIUM: 8.9 mg/dL (ref 8.9–10.3)
CHLORIDE: 109 mmol/L (ref 101–111)
CO2: 21 mmol/L — AB (ref 22–32)
CREATININE: 4.62 mg/dL — AB (ref 0.61–1.24)
GFR calc Af Amer: 13 mL/min — ABNORMAL LOW (ref >60)
GFR calc non Af Amer: 11 mL/min — ABNORMAL LOW (ref >60)
GLUCOSE: 221 mg/dL — AB (ref 65–99)
Potassium: 4.3 mmol/L (ref 3.5–5.1)
Sodium: 145 mmol/L (ref 135–145)

## 2016-02-08 LAB — GLUCOSE, CAPILLARY
GLUCOSE-CAPILLARY: 179 mg/dL — AB (ref 65–99)
GLUCOSE-CAPILLARY: 150 mg/dL — AB (ref 65–99)
Glucose-Capillary: 238 mg/dL — ABNORMAL HIGH (ref 65–99)
Glucose-Capillary: 226 mg/dL — ABNORMAL HIGH (ref 65–99)

## 2016-02-08 LAB — PROTIME-INR
INR: 3.17 — ABNORMAL HIGH (ref 0.00–1.49)
PROTHROMBIN TIME: 31.9 s — AB (ref 11.6–15.2)

## 2016-02-08 MED ORDER — LANTHANUM CARBONATE 500 MG PO CHEW
1000.0000 mg | CHEWABLE_TABLET | Freq: Three times a day (TID) | ORAL | Status: DC
  Administered 2016-02-08 – 2016-02-15 (×15): 1000 mg via ORAL
  Filled 2016-02-08 (×24): qty 2

## 2016-02-08 MED ORDER — METOPROLOL TARTRATE 5 MG/5ML IV SOLN
10.0000 mg | INTRAVENOUS | Status: DC | PRN
  Administered 2016-02-08: 10 mg via INTRAVENOUS
  Filled 2016-02-08: qty 10

## 2016-02-08 NOTE — Progress Notes (Signed)
Patient ID: Paul Guerra, male   DOB: 1934-11-04, 81 y.o.   MRN: [id number]  [facility] Progress Note    Assessment/ Plan:   1. AKI possibly on chronic kidney disease stage III: Suspected to be hemodynamically mediated ischemic ATN with relative hypotension/A. Fib with RVR in the face of ongoing ACE inhibitor plus/minus obstruction. He continues to maintain good urine output and now evidence of continued renal recovery-he does not have any concerning acute electrolyte abnormalities to prompt intervention and does not have any uremic signs or symptoms.  On hypotonic fluids at 50 mL-D5 1/4NS with net negative fluid balance. We'll continue these fluids given his mentation and most likely inability to adequately take in orally. 2.  Hypernatremia: Encouraged oral intake of fluid-continue on current low rate of hypotonic fluids to minimize rapid correction/brain edema. 3. Right middle lobe/right lower lobe pneumonia with sputum cultures positive for Pseudomonas: On Primaxin 4. History of right hydronephrosis status post stent with chronic history of nephrolithiasis 5. Atrial fibrillation: Transient RVR-now back in sinus rhythm and appears to be rate controlled-earlier appears to have suffered sinus tachycardia  Subjective:   Earlier today was confused/combative tachycardic-low-grade fever of 99.6 as well. Awaiting chest x-ray.    Objective:   BP 142/80 mmHg  Pulse 86  Temp(Src) 99.6 F (37.6 C) (Axillary)  Resp 18  Ht 6\' 3"  (1.905 m)  Wt 73.7 kg (162 lb 7.7 oz)  BMI 20.31 kg/m2  SpO2 98%  Intake/Output Summary (Last 24 hours) at 02/08/16 1133 Last data filed at 02/08/16 1034  Gross per 24 hour  Intake 1146.66 ml  Output   2225 ml  Net -1078.34 ml   Weight change: -3.3 kg (-7 lb 4.4 oz)  Physical Exam: Gen: Appears to be somewhat uncomfortable resting in bed-less talkative/communicative than yesterday  CVS: Pulse regular rhythm, normal rate  Resp: Coarse bilaterally to  auscultation, no rales /rhonchi Abd: Soft, flat, nontender  Ext: No edema   Imaging: No results found.  Labs: BMET  Recent Labs Lab 02/02/16 0336 02/03/16 0015 02/03/16 0325 02/04/16 0341 02/05/16 0324 02/06/16 0318 02/07/16 0324 02/08/16 0423  NA 144 144 145 147* 147* 146* 146* 145  K 4.4 4.2 4.0 4.1 4.2 3.3* 3.7 4.3  CL 112* 112* 111 111 109 108 110 109  CO2 19* 20* 20* 21* 21* 22 22 21*  GLUCOSE 230* 322* 273* 293* 219* 175* 239* 221*  BUN 72* 91* 93* 109* 97* 103* 103* 88*  CREATININE 4.67* 5.13* 5.24* 5.96* 5.76* 5.74* 5.11* 4.62*  CALCIUM 8.5* 8.3* 8.2* 8.4* 8.5* 8.8* 9.0 8.9  PHOS 6.0*  --  5.6* 5.2*  --  5.1* 4.4  --    CBC  Recent Labs Lab 02/06/16 0318  WBC 5.6  HGB 8.4*  HCT 25.5*  MCV 91.1  PLT 262    Medications:    . amitriptyline  25 mg Oral QHS  . amLODipine  2.5 mg Oral Daily  . antiseptic oral rinse  7 mL Mouth Rinse BID  . atorvastatin  20 mg Oral q1800  . dutasteride  0.5 mg Oral Daily  . feeding supplement (GLUCERNA SHAKE)  237 mL Oral TID BM  . imipenem-cilastatin  250 mg Intravenous Q12H  . insulin aspart  0-15 Units Subcutaneous TID AC & HS  . insulin glargine  10 Units Subcutaneous Daily  . lanthanum  1,000 mg Oral TID WC  . metoprolol tartrate  50 mg Oral BID  . Warfarin - Pharmacist Dosing Inpatient  Does not apply q1800      Zetta Bills, MD 02/08/2016, 11:33 AM

## 2016-02-08 NOTE — Progress Notes (Signed)
Pharmacy Antibiotic Note  Paul Guerra is a 81 y.o. male admitted on 01/17/2016 after [facility] with elevated creatinine, probable UTI, and pneumonia. He was treated with 5 days of levaquin for presumed CAP.  On 4/22, he went into respiration distress after fluid resuscitation and required intubation. Sputum cultures reveal Pseudomonas aeruginosa. Given significant history of PCN allergy, Pharmacy has been consulted for Primaxin dosing.  4/29: Afebrile. SCr improving, CrCl ~4814ml/min. Cultures from 4/24 also growing pseudomonas, sensitive to imipenem.  Plan:  Cont Primaxin 250mg  IV q12h  Monitor renal function closely and adjust as needed  Height: 6\' 3"  (190.5 cm) Weight: 162 lb 7.7 oz (73.7 kg) IBW/kg (Calculated) : 84.5  Temp (24hrs), Avg:98.8 F (37.1 C), Min:98.3 F (36.8 C), [name]:99.6 F (37.6 C)   Recent Labs Lab 02/04/16 0341 02/05/16 0324 02/06/16 0318 02/07/16 0324 02/08/16 0423  WBC  --   --  5.6  --   --   CREATININE 5.96* 5.76* 5.74* 5.11* 4.62*    Estimated Creatinine Clearance: 13.7 mL/min (by C-G formula based on Cr of 4.62).    Allergies  Allergen Reactions  . Nitroglycerin Anaphylaxis  . Penicillins Anaphylaxis, Hives and Shortness Of Breath    Has patient had a PCN reaction causing immediate rash, facial/tongue/throat swelling, SOB or lightheadedness with hypotension: Yes Has patient had a PCN reaction causing severe rash involving mucus membranes or skin necrosis: Yes Has patient had a PCN reaction that required hospitalization Yes Has patient had a PCN reaction occurring within the last 10 years: No If all of the above answers are "NO", then may proceed with Cephalosporin use.   . Sudafed  [Pseudoephedrine Hcl] Hives  . Tetracycline Shortness Of Breath    Other reaction(s): SHORTNESS OF BREATH  . Vancomycin Hives  . Doxycycline Rash    Other reaction(s): RASH  . Sulfa Antibiotics Rash    Antimicrobials this admission: 4/7 >> LVQ >>  4/12 4/7 >> Fluconazole >> 4/17 4/26 >> Primaxin >>  Dose adjustments this admission: 4/7: LVQ adjusted for renal fxn-750mg  x 1, then 500mg  q48h  Previous cultures (2015?) per urology / care everywhere, UTI with pseudomonas (resistant to cipro), enterococcus and candida glabrata  Microbiology results: 4/7 Strep Pneumo / Legionella UAg: Neg / Neg 4/7 BCx: NGF 4/7 UCx: >100K Yeast (U/A turbid, +bacteria, large WBC) 4/22 MRSA PCR (-) 4/22 bcx x2: NGF 4/22 Trach asp: moderate P. Aeruginosa (S - cefepime, ceftaz, imip, zosyn, tob) (R- cipro, I - gent) 4/24 Trach asp: abundant P. Aeruginosa (S - imip, zosyn, tob) (R - cipro, I - gent)  Thank you for allowing pharmacy to be a part of this patient's care.  Charolotte Ekeom Catrina Fellenz, PharmD, pager [phone]. 02/08/2016,9:24 AM.

## 2016-02-08 NOTE — Progress Notes (Signed)
PROGRESS NOTE  Paul Guerra:[id number] DOB: Nov 13, 1934 DOA: 01/17/2016 PCP: No primary care provider on file. Outpatient Specialists:    LOS: 22 days   Subjective: Seen with RN at bedside, he is confused, combative. Tachycardic at 120. Had low-grade temperature of 99.6 earlier today continue to follow closely, continue antibiotic.  Brief Narrative: Transfer from [facility] on 01/17/16. Patient is a 81 year old male with history of pancreatitis, DM, HTN, recurrent UTI, recurrent kidney stones, atrial fibrillation, and colon cancer (right hemicolectomy). Presented to [facility] ED with elevated creatinine, probable UTI, and pneumonia. He has been having urinary frequency, dysuria, confusion, and suprapubic abdominal pain over the past month. He saw PCP 2 days prior to admission and was started on ciprofloxacin for UTI, but on his way home his PCP called to tell him to go to ED for IV fluids and antibiotics. Blood pressure was 97/51, CT showed hydronephrosis and kidney stones, and chest x-ray showed pneumonia. Acute renal failure not improving very well, thought to be secondary to ATN.  Patient sent to the ICU on 4/22 after fluid overload and developed acute renal failure, also developed HCAP secondary to pneumonia currently recovering very slowly.  Assessment & Plan:   Acute respiratory failure with hypoxia Patient developed respiratory failure with hypoxia likely secondary to fluid overload which required mechanical ventilation. Patient was intubated on 4/22 and extubated on 4/25, on 4/26 reintubated, self extubated on 427. Currently not even requiring oxygen.  Pseudomonas aeruginosa HCAP Tracheal aspirate grew Pseudomonas, patient currently on Primaxin. CXR showed infiltrates in the RML and the RLL. Still has some productive cough. Continue supportive management with bronchodilators, mucolytics, antitussives and oxygen as needed.  Acute renal failure ATN and hydronephrosis related  plus or minus ACE inhibitor, I appreciate nephrology's help Creatinine stabilized but no improvement so far, on admission was 3.7, currently creatinine 0.6 down from 5.1 yesterday. Patient s/p cystoscopy with right ureteral stenting 01/20/2016, Hydronephrosis resolved based on last [place]. Continues to have good urine output, made 1.85 L yesterday.  Atrial fibrillation Chronic atrial fibrillation, rate controlled on metoprolol  CHA2DS2-VASc of at least 4 for DM, HTN and 2 points for age. INR subtherapeutic, continue coumadin  Metabolic acidosis Mild metabolic acidosis likely secondary to his acute renal failure.  Chest pain - 01/18/16 patient stated chest pain lasted only 30 seconds with both atypical features. - Patient with prior cardiac history. EKG with atrial fibrillation. Cardiac enzymes negative 3. - Resolved   Urinary tract infection/candiduria Was growing yeast on his urine given Diflucan for 8 days.  Hypertension - Lisinopril stopped secondary to worsening renal function -BP trending higher  Hyperlipidemia - Continue Lipitor  Right hydronephrosis/probable kidney stones - Per CT from outside hospital.  - Renal ultrasound with moderate right hydronephrosis and chronic UPJ stenosis. No evidence of left hydronephrosis area and left lower pole renal calculi. Patient status post cystoscopy and right retrograde pyelogram with double-J stent placement 01/20/2016 - Resolved per urology   Chronic anemia - No overt bleeding. - Status post 2 units PRBC on 4/10  - hb improved and stable since  BPH - Status post Foley catheter placement.  - Continue Avodart.   Diabetes mellitus - Hemoglobin A1c on 4/7 was 9.7.  - CBGs stable now  - Continue Lantus and SSI,   Confusion/combativeness Altered mental status likely secondary to the low-grade temperature he had this morning of 9.6. Continue Primaxin, repeat chest x-ray keep close eye on him, very low threshold to send back to  stepdown.  Code Status: Full Family Communication: None at bedside Disposition Plan:    Consultants: Urology Nephrology Was under PCCM till 02/07/2016  Procedures: Renal ultrasound 01/18/2016 Chest x-ray 01/17/2016 Renal US 01/18/2016 and 01/24/2016 2 units PRBCs 01/20/2016  Antibiotics: IV Levaquin 01/17/2016  Objective: Filed Vitals:   02/07/16 1200 02/07/16 1301 02/07/16 2224 02/08/16 0424  BP: 185/87 173/62 151/65 142/80  Pulse: 70 55 133 86  Temp:  98.3 F (36.8 C) 98.4 F (36.9 C) 99.6 F (37.6 C)  TempSrc:  Oral Axillary Axillary  Resp: 20 20 18 18   Height:      Weight:    73.7 kg (162 lb 7.7 oz)  SpO2: 96% 96% 91% 98%    Intake/Output Summary (Last 24 hours) at 02/08/16 1110 Last data filed at 02/08/16 1034  Gross per 24 hour  Intake 1146.66 ml  Output   2225 ml  Net -1078.34 ml   Filed Weights   02/06/16 0421 02/07/16 0402 02/08/16 0424  Weight: 79.7 kg (175 lb 11.3 oz) 77 kg (169 lb 12.1 oz) 73.7 kg (162 lb 7.7 oz)    Examination:  Filed Vitals:   02/07/16 1200 02/07/16 1301 02/07/16 2224 02/08/16 0424  BP: 185/87 173/62 151/65 142/80  Pulse: 70 55 133 86  Temp:  98.3 F (36.8 C) 98.4 F (36.9 C) 99.6 F (37.6 C)  TempSrc:  Oral Axillary Axillary  Resp: 20 20 18 18   Height:      Weight:    73.7 kg (162 lb 7.7 oz)  SpO2: 96% 96% 91% 98%   Constitutional: well nourished, well developed, Alert, awake, Oriented x2  Respiratory: clear to auscultation bilaterally, no wheezing, no crackles. Normal respiratory effort. No accessory muscle use.  Cardiovascular: Regular rate and rhythm. No extremity edema. Abdomen: Soft, non tender, non distended. Bowel sounds positive.  Musculoskeletal: no clubbing / cyanosis.  Neurologic: non focal     Data Reviewed: I have personally reviewed following labs and imaging studies  CBC:  Recent Labs Lab 02/06/16 0318  WBC 5.6  HGB 8.4*  HCT 25.5*  MCV 91.1  PLT 262   Basic Metabolic Panel:  Recent  Labs Lab 02/01/16 1949 02/02/16 0336 02/03/16 0015 02/03/16 0325 02/04/16 0341 02/05/16 0324 02/06/16 0318 02/07/16 0324 02/08/16 0423  NA 145 144 144 145 147* 147* 146* 146* 145  K 4.5 4.4 4.2 4.0 4.1 4.2 3.3* 3.7 4.3  CL 114* 112* 112* 111 111 109 108 110 109  CO2 18* 19* 20* 20* 21* 21* 22 22 21*  GLUCOSE 200* 230* 322* 273* 293* 219* 175* 239* 221*  BUN 66* 72* 91* 93* 109* 97* 103* 103* 88*  CREATININE 4.36* 4.67* 5.13* 5.24* 5.96* 5.76* 5.74* 5.11* 4.62*  CALCIUM 8.5* 8.5* 8.3* 8.2* 8.4* 8.5* 8.8* 9.0 8.9  MG 1.7  --  1.7  --   --   --  2.1  --   --   PHOS  --  6.0*  --  5.6* 5.2*  --  5.1* 4.4  --    GFR: Estimated Creatinine Clearance: 13.7 mL/min (by C-G formula based on Cr of 4.62). Liver Function Tests:  Recent Labs Lab 02/02/16 0336 02/03/16 0325 02/04/16 0341 02/06/16 0318 02/07/16 0324  AST  --   --   --  36  --   ALT  --   --   --  32  --   ALKPHOS  --   --   --  68  --   BILITOT  --   --   --  0.6  --   PROT  --   --   --  6.5  --   ALBUMIN 2.7* 2.8* 2.8* 3.0* 2.9*   Coagulation Profile:  Recent Labs Lab 02/04/16 0341 02/05/16 0324 02/06/16 0318 02/07/16 0324 02/08/16 0423  INR 2.25* 2.39* 3.18* 3.04* 3.17*   Cardiac Enzymes:  Recent Labs Lab 02/02/16 0847  TROPONINI 0.03    Recent Labs Lab 02/07/16 0807 02/07/16 1300 02/07/16 1647 02/07/16 2216 02/08/16 0727  GLUCAP 158* 271* 159* 83 179*   Urine analysis:    Component Value Date/Time   COLORURINE YELLOW 02/01/2016 1719   APPEARANCEUR CLEAR 02/01/2016 1719   LABSPEC 1.008 02/01/2016 1719   PHURINE 5.0 02/01/2016 1719   GLUCOSEU NEGATIVE 02/01/2016 1719   HGBUR MODERATE* 02/01/2016 1719   BILIRUBINUR NEGATIVE 02/01/2016 1719   KETONESUR NEGATIVE 02/01/2016 1719   PROTEINUR NEGATIVE 02/01/2016 1719   UROBILINOGEN 0.2 05/30/2011 1144   NITRITE NEGATIVE 02/01/2016 1719   LEUKOCYTESUR SMALL* 02/01/2016 1719   Sepsis Labs: Invalid input(s): PROCALCITONIN,  LACTICIDVEN  Recent Results (from the past 240 hour(s))  MRSA PCR Screening     Status: None   Collection Time: 02/01/16  4:03 AM  Result Value Ref Range Status   MRSA by PCR NEGATIVE NEGATIVE Final    Comment:        The GeneXpert MRSA Assay (FDA approved for NASAL specimens only), is one component of a comprehensive MRSA colonization surveillance program. It is not intended to diagnose MRSA infection nor to guide or monitor treatment for MRSA infections.   Culture, blood (routine x 2)     Status: None   Collection Time: 02/01/16  9:02 AM  Result Value Ref Range Status   Specimen Description BLOOD RIGHT ANTECUBITAL  Final   Special Requests BOTTLES DRAWN AEROBIC AND ANAEROBIC 10CC  Final   Culture   Final    NO GROWTH 5 DAYS Performed [facility]    Report Status 02/06/2016 FINAL  Final  Culture, blood (routine x 2)     Status: None   Collection Time: 02/01/16  9:08 AM  Result Value Ref Range Status   Specimen Description BLOOD LEFT ANTECUBITAL  Final   Special Requests BOTTLES DRAWN AEROBIC AND ANAEROBIC 10CC  Final   Culture   Final    NO GROWTH 5 DAYS Performed at [facility]    Report Status 02/06/2016 FINAL  Final  Culture, respiratory (NON-Expectorated)     Status: None   Collection Time: 02/01/16 10:37 AM  Result Value Ref Range Status   Specimen Description TRACHEAL ASPIRATE  Final   Special Requests Normal  Final   Gram Stain   Final    FEW WBC PRESENT, PREDOMINANTLY PMN NO SQUAMOUS EPITHELIAL CELLS SEEN ABUNDANT GRAM POSITIVE RODS MODERATE GRAM POSITIVE COCCI IN PAIRS IN CLUSTERS FEW GRAM NEGATIVE RODS Performed at [facility]    Culture   Final    MODERATE PSEUDOMONAS AERUGINOSA RARE GROUP B STREP(S.AGALACTIAE)ISOLATED Note: Beta hemolytic streptococci are predictably susceptible to penicillin and other beta lactams. Susceptibility testing not routinely performed. Performed at [facility]    Report Status  02/05/2016 FINAL  Final   Organism ID, Bacteria PSEUDOMONAS AERUGINOSA  Final      Susceptibility   Pseudomonas aeruginosa - MIC*    CEFEPIME 8 SENSITIVE Sensitive     CEFTAZIDIME 4 SENSITIVE Sensitive     CIPROFLOXACIN >=4 RESISTANT Resistant     GENTAMICIN 8 INTERMEDIATE Intermediate  IMIPENEM <=0.25 SENSITIVE Sensitive     PIP/TAZO <=4 SENSITIVE Sensitive     TOBRAMYCIN <=1 SENSITIVE Sensitive     * MODERATE PSEUDOMONAS AERUGINOSA  Culture, respiratory (NON-Expectorated)     Status: None   Collection Time: 02/03/16  9:00 PM  Result Value Ref Range Status   Specimen Description TRACHEAL ASPIRATE  Final   Special Requests NONE  Final   Gram Stain   Final    ABUNDANT WBC PRESENT, PREDOMINANTLY PMN NO SQUAMOUS EPITHELIAL CELLS SEEN ABUNDANT GRAM POSITIVE RODS FEW GRAM NEGATIVE RODS Performed at [facility]    Culture   Final    ABUNDANT PSEUDOMONAS AERUGINOSA Performed at [facility]    Report Status 02/07/2016 FINAL  Final   Organism ID, Bacteria PSEUDOMONAS AERUGINOSA  Final      Susceptibility   Pseudomonas aeruginosa - MIC*    CIPROFLOXACIN >=4 RESISTANT Resistant     GENTAMICIN 8 INTERMEDIATE Intermediate     IMIPENEM <=0.25 SENSITIVE Sensitive     PIP/TAZO <=4 SENSITIVE Sensitive     TOBRAMYCIN <=1 SENSITIVE Sensitive     * ABUNDANT PSEUDOMONAS AERUGINOSA      Radiology Studies: No results found.   Scheduled Meds: . amitriptyline  25 mg Oral QHS  . amLODipine  2.5 mg Oral Daily  . antiseptic oral rinse  7 mL Mouth Rinse BID  . atorvastatin  20 mg Oral q1800  . dutasteride  0.5 mg Oral Daily  . feeding supplement (GLUCERNA SHAKE)  237 mL Oral TID BM  . imipenem-cilastatin  250 mg Intravenous Q12H  . insulin aspart  0-15 Units Subcutaneous TID AC & HS  . insulin glargine  10 Units Subcutaneous Daily  . lanthanum  1,000 mg Oral TID WC  . metoprolol tartrate  50 mg Oral BID  . Warfarin - Pharmacist Dosing Inpatient   Does not apply q1800    Continuous Infusions: . dextrose 5 % and 0.2 % NaCl 100 mL/hr at 02/07/16 0600  . dextrose 5 % and 0.2 % NaCl 50 mL/hr at 02/08/16 0038    [facility] A, MD [facility] Pager [phone]  If 7PM-7AM, please contact night-coverage [email or web address] Password [email or web address] 02/08/2016, 11:10 AM

## 2016-02-08 NOTE — Progress Notes (Signed)
Pt somewhat resting, was able to connect him to IV fluids and give him his IV antibiotic, still cannot apply telemetry due to pt being quickly agitated/combative. Will continue to make attempts at reapplying telemetry and will continue to monitor pt. Bed is in lowest position and call bell is within his reach. Bed alarm is on, door is open.

## 2016-02-08 NOTE — Progress Notes (Signed)
ANTICOAGULATION CONSULT NOTE - Follow Up Consult  Pharmacy Consult for Warfarin Indication: atrial fibrillation  Allergies  Allergen Reactions  . Nitroglycerin Anaphylaxis  . Penicillins Anaphylaxis, Hives and Shortness Of Breath    Has patient had a PCN reaction causing immediate rash, facial/tongue/throat swelling, SOB or lightheadedness with hypotension: Yes Has patient had a PCN reaction causing severe rash involving mucus membranes or skin necrosis: Yes Has patient had a PCN reaction that required hospitalization Yes Has patient had a PCN reaction occurring within the last 10 years: No If all of the above answers are "NO", then may proceed with Cephalosporin use.   . Sudafed  [Pseudoephedrine Hcl] Hives  . Tetracycline Shortness Of Breath    Other reaction(s): SHORTNESS OF BREATH  . Vancomycin Hives  . Doxycycline Rash    Other reaction(s): RASH  . Sulfa Antibiotics Rash    Patient Measurements: Height: 6\' 3"  (190.5 cm) Weight: 162 lb 7.7 oz (73.7 kg) IBW/kg (Calculated) : 84.5  Vital Signs: Temp: 99.6 F (37.6 C) (04/29 0424) Temp Source: Axillary (04/29 0424) BP: 142/80 mmHg (04/29 0424) Pulse Rate: 86 (04/29 0424)  Labs:  Recent Labs  02/06/16 0318 02/07/16 0324 02/08/16 0423  HGB 8.4*  --   --   HCT 25.5*  --   --   PLT 262  --   --   LABPROT 32.0* 30.9* 31.9*  INR 3.18* 3.04* 3.17*  CREATININE 5.74* 5.11* 4.62*    Estimated Creatinine Clearance: 13.7 mL/min (by C-G formula based on Cr of 4.62).  Assessment: Patient is a 81 yo M with hx colon cancer and atrial fibrillation on warfarin PTA who is currently admitted for acute renal failure, CAP, and UTI. Pharmacy consulted to assist with dosing warfarin. Lovenox bridging dc'd 4/23 stopped as INR became therapeutic.  Home dose is 5mg  T/R/Sat and 2.5mg  all other days  Today, 02/08/2016 - INR still SUPRAtherapeutic at 3.17. - CBC: Hgb low, pltc WNL (4/27) - No bleeding documented - Diet: Carb mod -  10-25% intake. Ensure x 2 cans yesterday. - Drug interactions:   - amiodarone drip from 4/24 to 4/26.   - broad spectrum antibiotic (primaxin) may increase warfarin sensitivity  Goal of Therapy:  INR 2-3   Plan:  - Hold warfarin again tonight for supratherapeutic INR - Daily INR. CBC in the AM. - Monitor for s/s bleeding  Charolotte Ekeom Estill Llerena, PharmD, pager [phone]. 02/08/2016,9:34 AM.

## 2016-02-08 NOTE — Progress Notes (Signed)
I was able to place telemetry back on patient. Will continue to monitor pt. Bed in lowest position, call bell in reach.

## 2016-02-08 NOTE — Progress Notes (Signed)
Informed from central telemetry that pt was in A. Fib. Paged MD to make aware. No new orders received. Will continue to monitor.

## 2016-02-08 NOTE — Progress Notes (Signed)
Lab unable to collect blood cultures due to lack of IV access and patient being combative. Next lab shift will re-attempt blood draw for blood culture.  Earnest ConroyBrooke M. Clelia CroftShaw, RN

## 2016-02-09 LAB — RENAL FUNCTION PANEL
ALBUMIN: 3.1 g/dL — AB (ref 3.5–5.0)
ANION GAP: 16 — AB (ref 5–15)
BUN: 84 mg/dL — ABNORMAL HIGH (ref 6–20)
CALCIUM: 8.7 mg/dL — AB (ref 8.9–10.3)
CO2: 20 mmol/L — ABNORMAL LOW (ref 22–32)
Chloride: 110 mmol/L (ref 101–111)
Creatinine, Ser: 4.64 mg/dL — ABNORMAL HIGH (ref 0.61–1.24)
GFR calc Af Amer: 13 mL/min — ABNORMAL LOW (ref >60)
GFR calc non Af Amer: 11 mL/min — ABNORMAL LOW (ref >60)
GLUCOSE: 147 mg/dL — AB (ref 65–99)
PHOSPHORUS: 4 mg/dL (ref 2.5–4.6)
Potassium: 3.8 mmol/L (ref 3.5–5.1)
SODIUM: 146 mmol/L — AB (ref 135–145)

## 2016-02-09 LAB — PROTIME-INR
INR: 3.41 — AB (ref 0.00–1.49)
PROTHROMBIN TIME: 33.7 s — AB (ref 11.6–15.2)

## 2016-02-09 LAB — GLUCOSE, CAPILLARY
GLUCOSE-CAPILLARY: 116 mg/dL — AB (ref 65–99)
GLUCOSE-CAPILLARY: 222 mg/dL — AB (ref 65–99)
Glucose-Capillary: 109 mg/dL — ABNORMAL HIGH (ref 65–99)
Glucose-Capillary: 163 mg/dL — ABNORMAL HIGH (ref 65–99)

## 2016-02-09 LAB — CBC
HCT: 25.3 % — ABNORMAL LOW (ref 39.0–52.0)
HEMOGLOBIN: 8.4 g/dL — AB (ref 13.0–17.0)
MCH: 29.4 pg (ref 26.0–34.0)
MCHC: 33.2 g/dL (ref 30.0–36.0)
MCV: 88.5 fL (ref 78.0–100.0)
Platelets: 285 10*3/uL (ref 150–400)
RBC: 2.86 MIL/uL — AB (ref 4.22–5.81)
RDW: 14 % (ref 11.5–15.5)
WBC: 7.6 10*3/uL (ref 4.0–10.5)

## 2016-02-09 LAB — MAGNESIUM: Magnesium: 1.9 mg/dL (ref 1.7–2.4)

## 2016-02-09 MED ORDER — GUAIFENESIN ER 600 MG PO TB12
1200.0000 mg | ORAL_TABLET | Freq: Two times a day (BID) | ORAL | Status: DC
  Administered 2016-02-09 – 2016-02-14 (×11): 1200 mg via ORAL
  Filled 2016-02-09 (×13): qty 2

## 2016-02-09 NOTE — Progress Notes (Signed)
ANTICOAGULATION CONSULT NOTE - Follow Up Consult  Pharmacy Consult for Warfarin Indication: atrial fibrillation  Allergies  Allergen Reactions  . Nitroglycerin Anaphylaxis  . Penicillins Anaphylaxis, Hives and Shortness Of Breath    Has patient had a PCN reaction causing immediate rash, facial/tongue/throat swelling, SOB or lightheadedness with hypotension: Yes Has patient had a PCN reaction causing severe rash involving mucus membranes or skin necrosis: Yes Has patient had a PCN reaction that required hospitalization Yes Has patient had a PCN reaction occurring within the last 10 years: No If all of the above answers are "NO", then may proceed with Cephalosporin use.   . Sudafed  [Pseudoephedrine Hcl] Hives  . Tetracycline Shortness Of Breath    Other reaction(s): SHORTNESS OF BREATH  . Vancomycin Hives  . Doxycycline Rash    Other reaction(s): RASH  . Sulfa Antibiotics Rash    Patient Measurements: Height: 6\' 3"  (190.5 cm) Weight: 162 lb 7.7 oz (73.7 kg) IBW/kg (Calculated) : 84.5  Vital Signs: Temp: 97.7 F (36.5 C) (04/30 0656) Temp Source: Oral (04/30 0656) BP: 161/79 mmHg (04/30 0656) Pulse Rate: 67 (04/30 0656)  Labs:  Recent Labs  02/07/16 0324 02/08/16 0423 02/09/16 0440 02/09/16 0444  HGB  --   --  8.4*  --   HCT  --   --  25.3*  --   PLT  --   --  285  --   LABPROT 30.9* 31.9* 33.7*  --   INR 3.04* 3.17* 3.41*  --   CREATININE 5.11* 4.62*  --  4.64*    Estimated Creatinine Clearance: 13.7 mL/min (by C-G formula based on Cr of 4.64).  Assessment: Patient is a 81 yo M with hx colon cancer and atrial fibrillation on warfarin PTA who is currently admitted for acute renal failure, CAP, and UTI. Pharmacy consulted to assist with dosing warfarin. Lovenox bridging dc'd 4/23 stopped as INR became therapeutic.  Home dose is 5mg  T/R/Sat and 2.5mg  all other days  Today, 02/09/2016 - INR SUPRAtherapeutic and rising. Last dose given on 4/26. - CBC: Hgb low,  pltc WNL. - No bleeding documented - Diet: Carb mod + Ensure - Intake is improved. - Drug interactions:   - amiodarone drip from 4/24 to 4/26.   - broad spectrum antibiotic (primaxin) may increase warfarin sensitivity  Goal of Therapy:  INR 2-3   Plan:  - Hold warfarin again tonight for supratherapeutic INR. - Daily INR. - Monitor for s/s bleeding  Charolotte Ekeom Alioune Hodgkin, PharmD, pager [phone]. 02/09/2016,8:49 AM.

## 2016-02-09 NOTE — Progress Notes (Signed)
PROGRESS NOTE  Paul Guerra ZOX:[id number] DOB: 1935-05-11 DOA: 01/17/2016 PCP: No primary care provider on file. Outpatient Specialists:    LOS: 23 days   Subjective: Seen with his daughter at bedside, denies any complaints this morning. Appears more appropriate, slight confusion but no combativeness. CXR is negative, urinalysis showed significant pyuria and many bacteria, patient is on Primaxin, continue.  Brief Narrative: Transfer from [facility] on 01/17/16. Patient is a 81 year old male with history of pancreatitis, DM, HTN, recurrent UTI, recurrent kidney stones, atrial fibrillation, and colon cancer (right hemicolectomy). Presented to [facility] ED with elevated creatinine, probable UTI, and pneumonia. He has been having urinary frequency, dysuria, confusion, and suprapubic abdominal pain over the past month. He saw PCP 2 days prior to admission and was started on ciprofloxacin for UTI, but on his way home his PCP called to tell him to go to ED for IV fluids and antibiotics. Blood pressure was 97/51, CT showed hydronephrosis and kidney stones, and chest x-ray showed pneumonia. Acute renal failure not improving very well, thought to be secondary to ATN.  Patient sent to the ICU on 4/22 after fluid overload and developed acute renal failure, also developed HCAP secondary to pneumonia currently recovering very slowly.  Assessment & Plan:   Acute respiratory failure with hypoxia Patient developed respiratory failure with hypoxia likely secondary to fluid overload which required mechanical ventilation. Patient was intubated on 4/22 and extubated on 4/25, on 4/26 reintubated, self extubated on 427. Currently not even requiring oxygen.  Pseudomonas aeruginosa HCAP Tracheal aspirate grew Pseudomonas, patient currently on Primaxin. CXR showed infiltrates in the RML and the RLL. Still has some productive cough. Continue supportive management with bronchodilators, mucolytics, antitussives and  oxygen as needed.  Acute renal failure ATN and hydronephrosis related plus or minus ACE inhibitor, I appreciate nephrology's help Creatinine stabilized but no improvement so far, on admission was 3.7, currently creatinine 0.6 down from 5.1 yesterday. Patient s/p cystoscopy with right ureteral stenting 01/20/2016, Hydronephrosis resolved based on last [place]. Continues to have good urine output, made about 2.7 L urine yesterday.  Atrial fibrillation Chronic atrial fibrillation, rate controlled on metoprolol  CHA2DS2-VASc of at least 4 for DM, HTN and 2 points for age. INR subtherapeutic, continue coumadin  Metabolic acidosis Mild metabolic acidosis likely secondary to his acute renal failure.  Chest pain - 01/18/16 patient stated chest pain lasted only 30 seconds with both atypical features. - Patient with prior cardiac history. EKG with atrial fibrillation. Cardiac enzymes negative 3. - Resolved   Urinary tract infection/candiduria Was growing yeast on his urine given Diflucan for 8 days.  Hypertension - Lisinopril stopped secondary to worsening renal function - BP trending higher  Hyperlipidemia - Continue Lipitor  Right hydronephrosis/probable kidney stones - Per CT from outside hospital.  - Renal ultrasound with moderate right hydronephrosis and chronic UPJ stenosis. No evidence of left hydronephrosis area and left lower pole renal calculi. Patient status post cystoscopy and right retrograde pyelogram with double-J stent placement 01/20/2016 - Resolved per urology   Chronic anemia - No overt bleeding. - Status post 2 units PRBC on 4/10  - hb improved and stable since  BPH - Status post Foley catheter placement.  - Continue Avodart.   Diabetes mellitus - Hemoglobin A1c on 4/7 was 9.7.  - CBGs stable now  - Continue Lantus and SSI,   Confusion/combativeness Altered mental status likely secondary to the low-grade temperature he had this morning of 99.6. Continue  Primaxin, CXR repeated  showed clear exam, urinalysis pyuria and bacteriuria, is on Primaxin, follow cultures.  Code Status: Full Family Communication: None at bedside Disposition Plan:    Consultants: Urology Nephrology Was under PCCM till 02/07/2016  Procedures: Renal ultrasound 01/18/2016 Chest x-ray 01/17/2016 Renal US 01/18/2016 and 01/24/2016 2 units PRBCs 01/20/2016  Antibiotics: IV Levaquin 01/17/2016  Objective: Filed Vitals:   02/08/16 1334 02/08/16 1801 02/08/16 2204 02/09/16 0656  BP: 152/78  152/57 161/79  Pulse:  84 67 67  Temp: 98.7 F (37.1 C)  98.2 F (36.8 C) 97.7 F (36.5 C)  TempSrc: Axillary  Oral Oral  Resp: Height:      Weight:      SpO2:   96% 97%    Intake/Output Summary (Last 24 hours) at 02/09/16 1117 Last data filed at 02/09/16 1026  Gross per 24 hour  Intake 1906.66 ml  Output   2200 ml  Net -293.34 ml   Filed Weights   02/06/16 0421 02/07/16 0402 02/08/16 0424  Weight: 79.7 kg (175 lb 11.3 oz) 77 kg (169 lb 12.1 oz) 73.7 kg (162 lb 7.7 oz)    Examination:  Filed Vitals:   02/08/16 1334 02/08/16 1801 02/08/16 2204 02/09/16 0656  BP: 152/78  152/57 161/79  Pulse:  84 67 67  Temp: 98.7 F (37.1 C)  98.2 F (36.8 C) 97.7 F (36.5 C)  TempSrc: Axillary  Oral Oral  Resp: Height:      Weight:      SpO2:   96% 97%   Constitutional: well nourished, well developed, Alert, awake, Oriented x2  Respiratory: clear to auscultation bilaterally, no wheezing, no crackles. Normal respiratory effort. No accessory muscle use.  Cardiovascular: Regular rate and rhythm. No extremity edema. Abdomen: Soft, non tender, non distended. Bowel sounds positive.  Musculoskeletal: no clubbing / cyanosis.  Neurologic: non focal     Data Reviewed: I have personally reviewed following labs and imaging studies  CBC:  Recent Labs Lab 02/06/16 0318 02/09/16 0440  WBC 5.6 7.6  HGB 8.4* 8.4*  HCT 25.5* 25.3*  MCV 91.1 88.5  PLT  262 285   Basic Metabolic Panel:  Recent Labs Lab 02/03/16 0015 02/03/16 0325 02/04/16 0341 02/05/16 0324 02/06/16 0318 02/07/16 0324 02/08/16 0423 02/09/16 0440 02/09/16 0444  NA 144 145 147* 147* 146* 146* 145  --  146*  K 4.2 4.0 4.1 4.2 3.3* 3.7 4.3  --  3.8  CL 112* 111 111 109 108 110 109  --  110  CO2 20* 20* 21* 21* 22 22 21*  --  20*  GLUCOSE 322* 273* 293* 219* 175* 239* 221*  --  147*  BUN 91* 93* 109* 97* 103* 103* 88*  --  84*  CREATININE 5.13* 5.24* 5.96* 5.76* 5.74* 5.11* 4.62*  --  4.64*  CALCIUM 8.3* 8.2* 8.4* 8.5* 8.8* 9.0 8.9  --  8.7*  MG 1.7  --   --   --  2.1  --   --  1.9  --   PHOS  --  5.6* 5.2*  --  5.1* 4.4  --   --  4.0   GFR: Estimated Creatinine Clearance: 13.7 mL/min (by C-G formula based on Cr of 4.64). Liver Function Tests:  Recent Labs Lab 02/03/16 0325 02/04/16 0341 02/06/16 0318 02/07/16 0324 02/09/16 0444  AST  --   --  36  --   --   ALT  --   --  32  --   --   ALKPHOS  --   --  68  --   --   BILITOT  --   --  0.6  --   --   PROT  --   --  6.5  --   --   ALBUMIN 2.8* 2.8* 3.0* 2.9* 3.1*   Coagulation Profile:  Recent Labs Lab 02/05/16 0324 02/06/16 0318 02/07/16 0324 02/08/16 0423 02/09/16 0440  INR 2.39* 3.18* 3.04* 3.17* 3.41*   Cardiac Enzymes: No results for input(s): CKTOTAL, CKMB, CKMBINDEX, TROPONINI in the last 168 hours.  Recent Labs Lab 02/07/16 2216 02/08/16 0727 02/08/16 1627 02/08/16 2200 02/09/16 0712  GLUCAP 83 179* 226* 150* 116*   Urine analysis:    Component Value Date/Time   COLORURINE YELLOW 02/08/2016 1345   APPEARANCEUR CLEAR 02/08/2016 1345   LABSPEC 1.012 02/08/2016 1345   PHURINE 7.0 02/08/2016 1345   GLUCOSEU 250* 02/08/2016 1345   HGBUR MODERATE* 02/08/2016 1345   BILIRUBINUR NEGATIVE 02/08/2016 1345   KETONESUR NEGATIVE 02/08/2016 1345   PROTEINUR 30* 02/08/2016 1345   UROBILINOGEN 0.2 05/30/2011 1144   NITRITE NEGATIVE 02/08/2016 1345   LEUKOCYTESUR MODERATE* 02/08/2016  1345   Sepsis Labs: Invalid input(s): PROCALCITONIN, LACTICIDVEN  Recent Results (from the past 240 hour(s))  MRSA PCR Screening     Status: None   Collection Time: 02/01/16  4:03 AM  Result Value Ref Range Status   MRSA by PCR NEGATIVE NEGATIVE Final    Comment:        The GeneXpert MRSA Assay (FDA approved for NASAL specimens only), is one component of a comprehensive MRSA colonization surveillance program. It is not intended to diagnose MRSA infection nor to guide or monitor treatment for MRSA infections.   Culture, blood (routine x 2)     Status: None   Collection Time: 02/01/16  9:02 AM  Result Value Ref Range Status   Specimen Description BLOOD RIGHT ANTECUBITAL  Final   Special Requests BOTTLES DRAWN AEROBIC AND ANAEROBIC 10CC  Final   Culture   Final    NO GROWTH 5 DAYS Performed at [facility]    Report Status 02/06/2016 FINAL  Final  Culture, blood (routine x 2)     Status: None   Collection Time: 02/01/16  9:08 AM  Result Value Ref Range Status   Specimen Description BLOOD LEFT ANTECUBITAL  Final   Special Requests BOTTLES DRAWN AEROBIC AND ANAEROBIC 10CC  Final   Culture   Final    NO GROWTH 5 DAYS Performed at [facility]    Report Status 02/06/2016 FINAL  Final  Culture, respiratory (NON-Expectorated)     Status: None   Collection Time: 02/01/16 10:37 AM  Result Value Ref Range Status   Specimen Description TRACHEAL ASPIRATE  Final   Special Requests Normal  Final   Gram Stain   Final    FEW WBC PRESENT, PREDOMINANTLY PMN NO SQUAMOUS EPITHELIAL CELLS SEEN ABUNDANT GRAM POSITIVE RODS MODERATE GRAM POSITIVE COCCI IN PAIRS IN CLUSTERS FEW GRAM NEGATIVE RODS Performed at [facility]    Culture   Final    MODERATE PSEUDOMONAS AERUGINOSA RARE GROUP B STREP(S.AGALACTIAE)ISOLATED Note: Beta hemolytic streptococci are predictably susceptible to penicillin and other beta lactams. Susceptibility testing not routinely  performed. Performed at [facility]    Report Status 02/05/2016 FINAL  Final   Organism ID, Bacteria PSEUDOMONAS AERUGINOSA  Final      Susceptibility   Pseudomonas aeruginosa - MIC*  CEFEPIME 8 SENSITIVE Sensitive     CEFTAZIDIME 4 SENSITIVE Sensitive     CIPROFLOXACIN >=4 RESISTANT Resistant     GENTAMICIN 8 INTERMEDIATE Intermediate     IMIPENEM <=0.25 SENSITIVE Sensitive     PIP/TAZO <=4 SENSITIVE Sensitive     TOBRAMYCIN <=1 SENSITIVE Sensitive     * MODERATE PSEUDOMONAS AERUGINOSA  Culture, respiratory (NON-Expectorated)     Status: None   Collection Time: 02/03/16  9:00 PM  Result Value Ref Range Status   Specimen Description TRACHEAL ASPIRATE  Final   Special Requests NONE  Final   Gram Stain   Final    ABUNDANT WBC PRESENT, PREDOMINANTLY PMN NO SQUAMOUS EPITHELIAL CELLS SEEN ABUNDANT GRAM POSITIVE RODS FEW GRAM NEGATIVE RODS Performed at [facility]    Culture   Final    ABUNDANT PSEUDOMONAS AERUGINOSA Performed at [facility]    Report Status 02/07/2016 FINAL  Final   Organism ID, Bacteria PSEUDOMONAS AERUGINOSA  Final      Susceptibility   Pseudomonas aeruginosa - MIC*    CIPROFLOXACIN >=4 RESISTANT Resistant     GENTAMICIN 8 INTERMEDIATE Intermediate     IMIPENEM <=0.25 SENSITIVE Sensitive     PIP/TAZO <=4 SENSITIVE Sensitive     TOBRAMYCIN <=1 SENSITIVE Sensitive     * ABUNDANT PSEUDOMONAS AERUGINOSA      Radiology Studies: Dg Chest Port 1v Same Day  02/08/2016  CLINICAL DATA:  SOB. H/o chronic a-fib, diabetes, cardiac arrest, HTN. Surgical h/o cardiac cath. Former smoker. EXAM: PORTABLE CHEST 1 VIEW COMPARISON:  02/05/2016 FINDINGS: Heart size upper normal. Uncoiling of the aorta. Vascular pattern normal. Lungs are clear. 4 mm nodule right lung base stable from 01/16/2016 CT scan at which time it was demonstrated to represent small calcified granuloma. IMPRESSION: No active disease. Electronically Signed   By: Esperanza Heir  M.D.   On: 02/08/2016 11:52     Scheduled Meds: . amitriptyline  25 mg Oral QHS  . amLODipine  2.5 mg Oral Daily  . antiseptic oral rinse  7 mL Mouth Rinse BID  . atorvastatin  20 mg Oral q1800  . dutasteride  0.5 mg Oral Daily  . feeding supplement (GLUCERNA SHAKE)  237 mL Oral TID BM  . imipenem-cilastatin  250 mg Intravenous Q12H  . insulin aspart  0-15 Units Subcutaneous TID AC & HS  . insulin glargine  10 Units Subcutaneous Daily  . lanthanum  1,000 mg Oral TID WC  . metoprolol tartrate  50 mg Oral BID  . Warfarin - Pharmacist Dosing Inpatient   Does not apply q1800   Continuous Infusions: . dextrose 5 % and 0.2 % NaCl 50 mL/hr at 02/08/16 0038    [facility] A, MD [facility] Pager [phone]  If 7PM-7AM, please contact night-coverage [email or web address] Password [email or web address] 02/09/2016, 11:17 AM

## 2016-02-09 NOTE — Progress Notes (Signed)
Patient ID: Paul Guerra, male   DOB: Sep 24, 1935, 81 y.o.   MRN: [id number]  [facility] Progress Note    Assessment/ Plan:   1. AKI possibly on chronic kidney disease stage III: Suspected to be hemodynamically mediated ischemic ATN with relative hypotension/A. Fib with RVR in the face of ongoing ACE inhibitor plus/minus obstruction. He continues to maintain good urine output and does not have any concerning acute electrolyte abnormalities to prompt intervention.Renal function changed between yesterday and today after some improvement noted yesterday. Continue to monitor for recovery. 2.  Hypernatremia: Encouraged oral intake of fluid-continue on current low rate of hypotonic fluids to minimize rapid correction/brain edema. Rise in sodium level is likely a result of his fluctuating mental status that has limited adequate oral intake. I will increase the rate of his hypotonic fluids (he has a deficit of about 2 L). 3. Right middle lobe/right lower lobe pneumonia with sputum cultures positive for Pseudomonas: On Primaxin 4. History of right hydronephrosis status post stent with chronic history of nephrolithiasis 5. Atrial fibrillation: Transient RVR-now back in sinus rhythm and appears to be rate controlled-earlier appears to have suffered sinus tachycardia  Subjective:   Mental status improved overnight and chest x-ray done yesterday did not show any acute pathology.    Objective:   BP 161/79 mmHg  Pulse 67  Temp(Src) 97.7 F (36.5 C) (Oral)  Resp 20  Ht  (1.905 m)  Wt 73.7 kg (162 lb 7.7 oz)  BMI 20.31 kg/m2  SpO2 97%  Intake/Output Summary (Last 24 hours) at 02/09/16 1243 Last data filed at 02/09/16 1151  Gross per 24 hour  Intake 1966.66 ml  Output   2200 ml  Net -233.34 ml   Weight change:   Physical Exam: Gen:  Comfortably sitting in recliner, doctor is at bedside CVS: Pulse regular rhythm, normal rate  Resp: Coarse bilaterally to auscultation, no rales  /rhonchi Abd: Soft, flat, nontender  Ext: No edema over lower extremities  Imaging: Dg Chest Port 1v Same Day  02/08/2016  CLINICAL DATA:  SOB. H/o chronic a-fib, diabetes, cardiac arrest, HTN. Surgical h/o cardiac cath. Former smoker. EXAM: PORTABLE CHEST 1 VIEW COMPARISON:  02/05/2016 FINDINGS: Heart size upper normal. Uncoiling of the aorta. Vascular pattern normal. Lungs are clear. 4 mm nodule right lung base stable from 01/16/2016 CT scan at which time it was demonstrated to represent small calcified granuloma. IMPRESSION: No active disease. Electronically Signed   By: Esperanza Heir M.D.   On: 02/08/2016 11:52    Labs: BMET  Recent Labs Lab 02/03/16 0325 02/04/16 0341 02/05/16 0324 02/06/16 0318 02/07/16 0324 02/08/16 0423 02/09/16 0444  NA 145 147* 147* 146* 146* 145 146*  K 4.0 4.1 4.2 3.3* 3.7 4.3 3.8  CL 111 111 109 108 110 109 110  CO2 20* 21* 21* 22 22 21* 20*  GLUCOSE 273* 293* 219* 175* 239* 221* 147*  BUN 93* 109* 97* 103* 103* 88* 84*  CREATININE 5.24* 5.96* 5.76* 5.74* 5.11* 4.62* 4.64*  CALCIUM 8.2* 8.4* 8.5* 8.8* 9.0 8.9 8.7*  PHOS 5.6* 5.2*  --  5.1* 4.4  --  4.0   CBC  Recent Labs Lab 02/06/16 0318 02/09/16 0440  WBC 5.6 7.6  HGB 8.4* 8.4*  HCT 25.5* 25.3*  MCV 91.1 88.5  PLT 262 285   Medications:    . amitriptyline  25 mg Oral QHS  . amLODipine  2.5 mg Oral Daily  . antiseptic oral rinse  7 mL  Mouth Rinse BID  . atorvastatin  20 mg Oral q1800  . dutasteride  0.5 mg Oral Daily  . feeding supplement (GLUCERNA SHAKE)  237 mL Oral TID BM  . guaiFENesin  1,200 mg Oral BID  . imipenem-cilastatin  250 mg Intravenous Q12H  . insulin aspart  0-15 Units Subcutaneous TID AC & HS  . insulin glargine  10 Units Subcutaneous Daily  . lanthanum  1,000 mg Oral TID WC  . metoprolol tartrate  50 mg Oral BID  . Warfarin - Pharmacist Dosing Inpatient   Does not apply [id number]   Zetta BillsJay Raushanah Osmundson, MD 02/09/2016, 12:43 PM

## 2016-02-10 LAB — CBC
HCT: 24.9 % — ABNORMAL LOW (ref 39.0–52.0)
HEMOGLOBIN: 8.3 g/dL — AB (ref 13.0–17.0)
MCH: 29.6 pg (ref 26.0–34.0)
MCHC: 33.3 g/dL (ref 30.0–36.0)
MCV: 88.9 fL (ref 78.0–100.0)
PLATELETS: 287 10*3/uL (ref 150–400)
RBC: 2.8 MIL/uL — AB (ref 4.22–5.81)
RDW: 13.7 % (ref 11.5–15.5)
WBC: 6.8 10*3/uL (ref 4.0–10.5)

## 2016-02-10 LAB — GLUCOSE, CAPILLARY
GLUCOSE-CAPILLARY: 164 mg/dL — AB (ref 65–99)
GLUCOSE-CAPILLARY: 160 mg/dL — AB (ref 65–99)
Glucose-Capillary: 174 mg/dL — ABNORMAL HIGH (ref 65–99)
Glucose-Capillary: 209 mg/dL — ABNORMAL HIGH (ref 65–99)

## 2016-02-10 LAB — RENAL FUNCTION PANEL
ALBUMIN: 3.1 g/dL — AB (ref 3.5–5.0)
Anion gap: 14 (ref 5–15)
BUN: 77 mg/dL — ABNORMAL HIGH (ref 6–20)
CO2: 21 mmol/L — AB (ref 22–32)
CREATININE: 4.21 mg/dL — AB (ref 0.61–1.24)
Calcium: 8.7 mg/dL — ABNORMAL LOW (ref 8.9–10.3)
Chloride: 108 mmol/L (ref 101–111)
GFR calc non Af Amer: 12 mL/min — ABNORMAL LOW (ref >60)
GFR, EST AFRICAN AMERICAN: 14 mL/min — AB (ref >60)
GLUCOSE: 205 mg/dL — AB (ref 65–99)
PHOSPHORUS: 4.8 mg/dL — AB (ref 2.5–4.6)
Potassium: 3.8 mmol/L (ref 3.5–5.1)
SODIUM: 143 mmol/L (ref 135–145)

## 2016-02-10 LAB — PROTIME-INR
INR: 2.88 — AB (ref 0.00–1.49)
PROTHROMBIN TIME: 29.7 s — AB (ref 11.6–15.2)

## 2016-02-10 LAB — MAGNESIUM: Magnesium: 1.8 mg/dL (ref 1.7–2.4)

## 2016-02-10 LAB — URINE CULTURE: Culture: 1000 — AB

## 2016-02-10 MED ORDER — WARFARIN SODIUM 1 MG PO TABS
1.0000 mg | ORAL_TABLET | Freq: Once | ORAL | Status: DC
  Filled 2016-02-10: qty 1

## 2016-02-10 MED ORDER — VITAMIN K1 10 MG/ML IJ SOLN
5.0000 mg | Freq: Once | INTRAVENOUS | Status: AC
  Administered 2016-02-10: 5 mg via INTRAVENOUS
  Filled 2016-02-10: qty 0.5

## 2016-02-10 NOTE — Progress Notes (Signed)
Patient ID: Paul Guerra, male   DOB: 07/06/1935, 81 y.o.   MRN: [id number] Request received for US guided random renal bx on pt. Hx noted. Labs checked. hgb 8.3, plts 287k, PT 29.7/ INR 2.88, creat 4.21. During conversation with patient regarding procedure he stated that he did not want to undergo biopsy at this time. Call was made to patient's daughter and she was made aware of tentative plans for biopsy. She will plan to visit patient later this evening to discuss issue further. Would prefer that INR be at 1.6 or less prior to potential renal biopsy. Will cont to monitor pt's status and schedule bx accordingly once all parties are in agreement with proceeding and INR at safe level.

## 2016-02-10 NOTE — Progress Notes (Signed)
PROGRESS NOTE  Paul Guerra ZOX:[id number] DOB: October 17, 1934 DOA: 01/17/2016 PCP: No primary care provider on file. Outpatient Specialists:    LOS: 24 days   Subjective: Appears to be much better, awake, alert and oriented 3. Discussed briefly with nephrology, US guided renal biopsy to be done. Give small dose of vitamin K to reverse the INR for the procedure  Brief Narrative: Transfer from [facility] on 01/17/16. Patient is a 81 year old male with history of pancreatitis, DM, HTN, recurrent UTI, recurrent kidney stones, atrial fibrillation, and colon cancer (right hemicolectomy). Presented to [facility] ED with elevated creatinine, probable UTI, and pneumonia. He has been having urinary frequency, dysuria, confusion, and suprapubic abdominal pain over the past month. He saw PCP 2 days prior to admission and was started on ciprofloxacin for UTI, but on his way home his PCP called to tell him to go to ED for IV fluids and antibiotics. Blood pressure was 97/51, CT showed hydronephrosis and kidney stones, and chest x-ray showed pneumonia. Acute renal failure not improving very well, thought to be secondary to ATN.  Patient sent to the ICU on 4/22 after fluid overload and developed acute renal failure, also developed HCAP secondary to pneumonia currently recovering very slowly.  Assessment & Plan:   Acute respiratory failure with hypoxia Patient developed respiratory failure with hypoxia likely secondary to fluid overload which required mechanical ventilation. Patient was intubated on 4/22 and extubated on 4/25, on 4/26 reintubated, self extubated on 427. This is resolved.  Pseudomonas aeruginosa HCAP Tracheal aspirate grew Pseudomonas, patient currently on Primaxin. CXR showed infiltrates in the RML and the RLL. Continue supportive management with bronchodilators, mucolytics, antitussives and oxygen as needed. He is improving, chest x-ray showed resolved infiltrates  Acute renal failure ATN  and hydronephrosis related plus or minus ACE inhibitor, I appreciate nephrology's help Creatinine stabilized but no improvement so far, on admission was 3.7, currently creatinine 0.6 down from 5.1 yesterday. Patient s/p cystoscopy with right ureteral stenting 01/20/2016, Hydronephrosis resolved based on last [place]. Continues to have good urine output, renal biopsy to be done when the INR allows.  Atrial fibrillation Chronic atrial fibrillation, rate controlled on metoprolol  CHA2DS2-VASc of at least 4 for DM, HTN and 2 points for age. Hold Coumadin for now. We will even consider to switch to DOAC  Metabolic acidosis Mild metabolic acidosis likely secondary to his acute renal failure.  Chest pain - 01/18/16 patient stated chest pain lasted only 30 seconds with both atypical features. - Patient with prior cardiac history. EKG with atrial fibrillation. Cardiac enzymes negative 3. - Resolved   Urinary tract infection/candiduria Was growing yeast on his urine given Diflucan for 8 days.  Hypertension - Lisinopril stopped secondary to worsening renal function - BP trending higher  Hyperlipidemia - Continue Lipitor  Right hydronephrosis/probable kidney stones - Per CT from outside hospital.  - Renal ultrasound with moderate right hydronephrosis and chronic UPJ stenosis. No evidence of left hydronephrosis area and left lower pole renal calculi. Patient status post cystoscopy and right retrograde pyelogram with double-J stent placement 01/20/2016 - Resolved per urology   Chronic anemia - No overt bleeding. - Status post 2 units PRBC on 4/10  - hb improved and stable since  BPH - Status post Foley catheter placement.  - Continue Avodart.   Diabetes mellitus - Hemoglobin A1c on 4/7 was 9.7.  - CBGs stable now  - Continue Lantus and SSI,   Confusion/combativeness Altered mental status likely secondary to the low-grade  temperature he had this morning of 99.6. Continue Primaxin, CXR  repeated showed clear exam, urinalysis pyuria and bacteriuria, is on Primaxin, follow cultures.  Code Status: Full Family Communication: None at bedside Disposition Plan:    Consultants: Urology Nephrology Was under PCCM till 02/07/2016  Procedures: Renal ultrasound 01/18/2016 Chest x-ray 01/17/2016 Renal US 01/18/2016 and 01/24/2016 2 units PRBCs 01/20/2016  Antibiotics: IV Levaquin 01/17/2016  Objective: Filed Vitals:   02/09/16 2046 02/10/16 0305 02/10/16 0609 02/10/16 0839  BP: 130/49 158/57 102/47 184/73  Pulse: 56 63 58 73  Temp: 97.7 F (36.5 C) 98.4 F (36.9 C) 97.3 F (36.3 C)   TempSrc: Oral Oral Oral   Resp: 18 20 18    Height:      Weight:   74.1 kg (163 lb 5.8 oz)   SpO2: 100% 99% 100%     Intake/Output Summary (Last 24 hours) at 02/10/16 1221 Last data filed at 02/10/16 1016  Gross per 24 hour  Intake 1447.5 ml  Output   2250 ml  Net -802.5 ml   Filed Weights   02/07/16 0402 02/08/16 0424 02/10/16 0609  Weight: 77 kg (169 lb 12.1 oz) 73.7 kg (162 lb 7.7 oz) 74.1 kg (163 lb 5.8 oz)    Examination:  Filed Vitals:   02/09/16 2046 02/10/16 0305 02/10/16 0609 02/10/16 0839  BP: 130/49 158/57 102/47 184/73  Pulse: 56 63 58 73  Temp: 97.7 F (36.5 C) 98.4 F (36.9 C) 97.3 F (36.3 C)   TempSrc: Oral Oral Oral   Resp: 18 20 18    Height:      Weight:   74.1 kg (163 lb 5.8 oz)   SpO2: 100% 99% 100%    Constitutional: well nourished, well developed, Alert, awake, Oriented x2  Respiratory: clear to auscultation bilaterally, no wheezing, no crackles. Normal respiratory effort. No accessory muscle use.  Cardiovascular: Regular rate and rhythm. No extremity edema. Abdomen: Soft, non tender, non distended. Bowel sounds positive.  Musculoskeletal: no clubbing / cyanosis.  Neurologic: non focal     Data Reviewed: I have personally reviewed following labs and imaging studies  CBC:  Recent Labs Lab 02/06/16 0318 02/09/16 0440 02/10/16 0447  WBC  5.6 7.6 6.8  HGB 8.4* 8.4* 8.3*  HCT 25.5* 25.3* 24.9*  MCV 91.1 88.5 88.9  PLT 262 285 287   Basic Metabolic Panel:  Recent Labs Lab 02/04/16 0341  02/06/16 0318 02/07/16 0324 02/08/16 0423 02/09/16 0440 02/09/16 0444 02/10/16 0447  NA 147*  < > 146* 146* 145  --  146* 143  K 4.1  < > 3.3* 3.7 4.3  --  3.8 3.8  CL 111  < > 108 110 109  --  110 108  CO2 21*  < > 22 22 21*  --  20* 21*  GLUCOSE 293*  < > 175* 239* 221*  --  147* 205*  BUN 109*  < > 103* 103* 88*  --  84* 77*  CREATININE 5.96*  < > 5.74* 5.11* 4.62*  --  4.64* 4.21*  CALCIUM 8.4*  < > 8.8* 9.0 8.9  --  8.7* 8.7*  MG  --   --  2.1  --   --  1.9  --  1.8  PHOS 5.2*  --  5.1* 4.4  --   --  4.0 4.8*  < > = values in this interval not displayed. GFR: Estimated Creatinine Clearance: 15.2 mL/min (by C-G formula based on Cr of 4.21). Liver Function Tests:  Recent Labs Lab 02/04/16 0341 02/06/16 0318 02/07/16 0324 02/09/16 0444 02/10/16 0447  AST  --  36  --   --   --   ALT  --  32  --   --   --   ALKPHOS  --  68  --   --   --   BILITOT  --  0.6  --   --   --   PROT  --  6.5  --   --   --   ALBUMIN 2.8* 3.0* 2.9* 3.1* 3.1*   Coagulation Profile:  Recent Labs Lab 02/06/16 0318 02/07/16 0324 02/08/16 0423 02/09/16 0440 02/10/16 0447  INR 3.18* 3.04* 3.17* 3.41* 2.88*   Cardiac Enzymes: No results for input(s): CKTOTAL, CKMB, CKMBINDEX, TROPONINI in the last 168 hours.  Recent Labs Lab 02/09/16 1125 02/09/16 1622 02/09/16 2045 02/10/16 0734 02/10/16 1151  GLUCAP 222* 109* 163* 174* 209*   Urine analysis:    Component Value Date/Time   COLORURINE YELLOW 02/08/2016 1345   APPEARANCEUR CLEAR 02/08/2016 1345   LABSPEC 1.012 02/08/2016 1345   PHURINE 7.0 02/08/2016 1345   GLUCOSEU 250* 02/08/2016 1345   HGBUR MODERATE* 02/08/2016 1345   BILIRUBINUR NEGATIVE 02/08/2016 1345   KETONESUR NEGATIVE 02/08/2016 1345   PROTEINUR 30* 02/08/2016 1345   UROBILINOGEN 0.2 05/30/2011 1144   NITRITE  NEGATIVE 02/08/2016 1345   LEUKOCYTESUR MODERATE* 02/08/2016 1345   Sepsis Labs: Invalid input(s): PROCALCITONIN, LACTICIDVEN  Recent Results (from the past 240 hour(s))  MRSA PCR Screening     Status: None   Collection Time: 02/01/16  4:03 AM  Result Value Ref Range Status   MRSA by PCR NEGATIVE NEGATIVE Final    Comment:        The GeneXpert MRSA Assay (FDA approved for NASAL specimens only), is one component of a comprehensive MRSA colonization surveillance program. It is not intended to diagnose MRSA infection nor to guide or monitor treatment for MRSA infections.   Culture, blood (routine x 2)     Status: None   Collection Time: 02/01/16  9:02 AM  Result Value Ref Range Status   Specimen Description BLOOD RIGHT ANTECUBITAL  Final   Special Requests BOTTLES DRAWN AEROBIC AND ANAEROBIC 10CC  Final   Culture   Final    NO GROWTH 5 DAYS Performed at [facility]    Report Status 02/06/2016 FINAL  Final  Culture, blood (routine x 2)     Status: None   Collection Time: 02/01/16  9:08 AM  Result Value Ref Range Status   Specimen Description BLOOD LEFT ANTECUBITAL  Final   Special Requests BOTTLES DRAWN AEROBIC AND ANAEROBIC 10CC  Final   Culture   Final    NO GROWTH 5 DAYS Performed at [facility]    Report Status 02/06/2016 FINAL  Final  Culture, respiratory (NON-Expectorated)     Status: None   Collection Time: 02/01/16 10:37 AM  Result Value Ref Range Status   Specimen Description TRACHEAL ASPIRATE  Final   Special Requests Normal  Final   Gram Stain   Final    FEW WBC PRESENT, PREDOMINANTLY PMN NO SQUAMOUS EPITHELIAL CELLS SEEN ABUNDANT GRAM POSITIVE RODS MODERATE GRAM POSITIVE COCCI IN PAIRS IN CLUSTERS FEW GRAM NEGATIVE RODS Performed at [facility]    Culture   Final    MODERATE PSEUDOMONAS AERUGINOSA RARE GROUP B STREP(S.AGALACTIAE)ISOLATED Note: Beta hemolytic streptococci are predictably susceptible to penicillin and other beta  lactams. Susceptibility testing not routinely  performed. Performed at [facility]    Report Status 02/05/2016 FINAL  Final   Organism ID, Bacteria PSEUDOMONAS AERUGINOSA  Final      Susceptibility   Pseudomonas aeruginosa - MIC*    CEFEPIME 8 SENSITIVE Sensitive     CEFTAZIDIME 4 SENSITIVE Sensitive     CIPROFLOXACIN >=4 RESISTANT Resistant     GENTAMICIN 8 INTERMEDIATE Intermediate     IMIPENEM <=0.25 SENSITIVE Sensitive     PIP/TAZO <=4 SENSITIVE Sensitive     TOBRAMYCIN <=1 SENSITIVE Sensitive     * MODERATE PSEUDOMONAS AERUGINOSA  Culture, respiratory (NON-Expectorated)     Status: None   Collection Time: 02/03/16  9:00 PM  Result Value Ref Range Status   Specimen Description TRACHEAL ASPIRATE  Final   Special Requests NONE  Final   Gram Stain   Final    ABUNDANT WBC PRESENT, PREDOMINANTLY PMN NO SQUAMOUS EPITHELIAL CELLS SEEN ABUNDANT GRAM POSITIVE RODS FEW GRAM NEGATIVE RODS Performed at [facility]    Culture   Final    ABUNDANT PSEUDOMONAS AERUGINOSA Performed at [facility]    Report Status 02/07/2016 FINAL  Final   Organism ID, Bacteria PSEUDOMONAS AERUGINOSA  Final      Susceptibility   Pseudomonas aeruginosa - MIC*    CIPROFLOXACIN >=4 RESISTANT Resistant     GENTAMICIN 8 INTERMEDIATE Intermediate     IMIPENEM <=0.25 SENSITIVE Sensitive     PIP/TAZO <=4 SENSITIVE Sensitive     TOBRAMYCIN <=1 SENSITIVE Sensitive     * ABUNDANT PSEUDOMONAS AERUGINOSA  Urine culture     Status: Abnormal   Collection Time: 02/08/16  1:45 PM  Result Value Ref Range Status   Specimen Description URINE, CATHETERIZED  Final   Special Requests NONE  Final   Culture (A)  Final    1,000 COLONIES/mL INSIGNIFICANT GROWTH Performed at [facility]    Report Status 02/10/2016 FINAL  Final  Culture, blood (Routine X 2) w Reflex to ID Panel     Status: None (Preliminary result)   Collection Time: 02/08/16  7:15 PM  Result Value Ref Range Status    Specimen Description BLOOD RIGHT ARM  Final   Special Requests IN PEDIATRIC BOTTLE 2CC  Final   Culture   Final    NO GROWTH 2 DAYS Performed at [facility]    Report Status PENDING  Incomplete  Culture, blood (Routine X 2) w Reflex to ID Panel     Status: None (Preliminary result)   Collection Time: 02/08/16  7:20 PM  Result Value Ref Range Status   Specimen Description BLOOD LEFT ARM  Final   Special Requests BOTTLES DRAWN AEROBIC ONLY 8CC  Final   Culture   Final    NO GROWTH 2 DAYS Performed at [facility]    Report Status PENDING  Incomplete      Radiology Studies: No results found.   Scheduled Meds: . amitriptyline  25 mg Oral QHS  . amLODipine  2.5 mg Oral Daily  . antiseptic oral rinse  7 mL Mouth Rinse BID  . atorvastatin  20 mg Oral q1800  . dutasteride  0.5 mg Oral Daily  . feeding supplement (GLUCERNA SHAKE)  237 mL Oral TID BM  . guaiFENesin  1,200 mg Oral BID  . imipenem-cilastatin  250 mg Intravenous Q12H  . insulin aspart  0-15 Units Subcutaneous TID AC & HS  . insulin glargine  10 Units Subcutaneous Daily  . lanthanum  1,000 mg  Oral TID WC  . warfarin  1 mg Oral ONCE-1800  . Warfarin - Pharmacist Dosing Inpatient   Does not apply q1800   Continuous Infusions: . dextrose 5 % and 0.2 % NaCl 75 mL/hr at 02/10/16 1016    Alexavier Tsutsui A, MD [facility] Pager [phone]  If 7PM-7AM, please contact night-coverage [email or web address] Password [email or web address] 02/10/2016, 12:21 PM

## 2016-02-10 NOTE — Progress Notes (Signed)
ANTICOAGULATION CONSULT NOTE - Follow Up Consult  Pharmacy Consult for Warfarin Indication: atrial fibrillation  Allergies  Allergen Reactions  . Nitroglycerin Anaphylaxis  . Penicillins Anaphylaxis, Hives and Shortness Of Breath    Has patient had a PCN reaction causing immediate rash, facial/tongue/throat swelling, SOB or lightheadedness with hypotension: Yes Has patient had a PCN reaction causing severe rash involving mucus membranes or skin necrosis: Yes Has patient had a PCN reaction that required hospitalization Yes Has patient had a PCN reaction occurring within the last 10 years: No If all of the above answers are "NO", then may proceed with Cephalosporin use.   . Sudafed  [Pseudoephedrine Hcl] Hives  . Tetracycline Shortness Of Breath    Other reaction(s): SHORTNESS OF BREATH  . Vancomycin Hives  . Doxycycline Rash    Other reaction(s): RASH  . Sulfa Antibiotics Rash    Patient Measurements: Height: 6\' 3"  (190.5 cm) Weight: 163 lb 5.8 oz (74.1 kg) IBW/kg (Calculated) : 84.5  Vital Signs: Temp: 97.3 F (36.3 C) (05/01 0609) Temp Source: Oral (05/01 0609) BP: 102/47 mmHg (05/01 0609) Pulse Rate: 58 (05/01 0609)  Labs:  Recent Labs  02/08/16 0423 02/09/16 0440 02/09/16 0444 02/10/16 0447  HGB  --  8.4*  --  8.3*  HCT  --  25.3*  --  24.9*  PLT  --  285  --  287  LABPROT 31.9* 33.7*  --  29.7*  INR 3.17* 3.41*  --  2.88*  CREATININE 4.62*  --  4.64* 4.21*    Estimated Creatinine Clearance: 15.2 mL/min (by C-G formula based on Cr of 4.21).  Assessment: Patient is a 81 yo M with hx colon cancer and atrial fibrillation on warfarin PTA who is currently admitted for acute renal failure, CAP, and UTI. Pharmacy consulted to assist with dosing warfarin. Lovenox bridging dc'd 4/23 stopped as INR became therapeutic.  Home dose is 5mg  T/R/Sat and 2.5mg  all other days  Today, 02/10/2016 - INR decreased into therapeutic range today after holding doses. Last dose  4/26. - CBC: Hgb low but stable, pltc WNL. - No bleeding documented - Diet: Carb mod - Intake is improved. - Drug interactions:   - amiodarone drip from 4/24 to 4/26.   - broad spectrum antibiotic (primaxin) may increase warfarin sensitivity  Goal of Therapy:  INR 2-3   Plan:  - Resume warfarin today with 1 mg dose x 1.  Lower dose since patient has been sensitive to warfarin during this admission. - Daily INR. - Monitor for s/s bleeding  Clance BollAmanda Damarri Rampy, PharmD, BCPS Pager: [phone] 02/10/2016 8:31 AM

## 2016-02-10 NOTE — Progress Notes (Addendum)
[facility] Progress Note   Subjective: "I'm getting better" .   Filed Vitals:   02/09/16 1341 02/09/16 2046 02/10/16 0305 02/10/16 0609  BP: 165/64 130/49 158/57 102/47  Pulse: 57 56 63 58  Temp: 97.6 F (36.4 C) 97.7 F (36.5 C) 98.4 F (36.9 C) 97.3 F (36.3 C)  TempSrc: Oral Oral Oral Oral  Resp: 20 18 20 18   Height:      Weight:    74.1 kg (163 lb 5.8 oz)  SpO2: 100% 100% 99% 100%    Inpatient medications: . amitriptyline  25 mg Oral QHS  . amLODipine  2.5 mg Oral Daily  . antiseptic oral rinse  7 mL Mouth Rinse BID  . atorvastatin  20 mg Oral q1800  . dutasteride  0.5 mg Oral Daily  . feeding supplement (GLUCERNA SHAKE)  237 mL Oral TID BM  . guaiFENesin  1,200 mg Oral BID  . imipenem-cilastatin  250 mg Intravenous Q12H  . insulin aspart  0-15 Units Subcutaneous TID AC & HS  . insulin glargine  10 Units Subcutaneous Daily  . lanthanum  1,000 mg Oral TID WC  . warfarin  1 mg Oral ONCE-1800  . Warfarin - Pharmacist Dosing Inpatient   Does not apply q1800   . dextrose 5 % and 0.2 % NaCl 75 mL/hr at 02/09/16 2132   ipratropium, levalbuterol, triamcinolone cream  Presentation: 81 y.o. male history of chronic pancreatitis, diabetes mellitus, BPH, chronic anemia, remote history of colon cancer status post right hemicolectomy, hypertension, hyperlipidemia, recurrent UTIs, recurrent kidney stones, atrial fibrillation on chronic anticoagulation on Coumadin who [facility] with a elevated creatinine 4 baseline approximately 1.1 w probable UTI and pneumonia. CT showed R hydronephrosis and concern for obstruction. [facility] spoke with Dr. Thad Rangereynolds urology who accepted the patient in consult.   Creat 3.90 on 4/7, peaked at 5.02 on 4/13, improved to 3.9 on 4/21, peaked again to 5.9 on 4/24, now down to 4.21 on 5/1. Had episode resp failure from iatrogenic vol overload/ pulm edema, resolved, was on vent, also had  pseudomonas PNA.   Exam: Alert, calm, no distress, pleasant No jvd Chest slight rales L base, o/w clear RRR no mrg Abd soft ntnd no ascites/ hsm +bs GU foley in place Ext no edema Neuro no asterixis, nf, O to town, day, hospital, not year  UA 4/7  tntc rbc/ wbc, many bact 30 prot > cx grew yeast >100k UA 4/22 6-30 rbc/ wbc, +yeast, rare bact, prot neg UA  4/29 6-30 rbc/ tntc wbc, many bact, clear, prot 30 > cx to young to read Renal US 4/8 > R hydro prob chron UPJ in absence of stone Renal US 4/14, 4/24 > resolved R hydro CXR 4/29 hazy slight infiltrate on R   Assessment: 1 Renal failure - presented high creat 4.0 (suspected ATN d/t low BP/ acei/ R hydro vs AIN from cipro). Hasn't gotten any better in 3 weeks. Will ask IR to do renal biopsy.  Not grossly uremic, no indication for HD yet. Good UOP.  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 3 Yeast UTI sp Rx 4 Pyuria - persistent, repeat urine Cx pending 5 HTN low dose norvasc 6 Pseudomonas pna 7 DM on insulin  Plan - renal biopsy ordered, will INR reversed, have d/w primary   Vinson Moselleob Kashari Chalmers MD [facility] pager [phone]    cell [phone] 02/10/2016, 8:39 AM    Recent Labs  Lab 02/07/16 0324 02/08/16 0423 02/09/16 0444 02/10/16 0447  NA 146* 145 146* 143  K 3.7 4.3 3.8 3.8  CL 110 109 110 108  CO2 22 21* 20* 21*  GLUCOSE 239* 221* 147* 205*  BUN 103* 88* 84* 77*  CREATININE 5.11* 4.62* 4.64* 4.21*  CALCIUM 9.0 8.9 8.7* 8.7*  PHOS 4.4  --  4.0 4.8*    Recent Labs Lab 02/06/16 0318 02/07/16 0324 02/09/16 0444 02/10/16 0447  AST 36  --   --   --   ALT 32  --   --   --   ALKPHOS 68  --   --   --   BILITOT 0.6  --   --   --   PROT 6.5  --   --   --   ALBUMIN 3.0* 2.9* 3.1* 3.1*    Recent Labs Lab 02/06/16 0318 02/09/16 0440 02/10/16 0447  WBC 5.6 7.6 6.8  HGB 8.4* 8.4* 8.3*  HCT 25.5* 25.3* 24.9*  MCV 91.1 88.5 88.9  PLT 262 285 287

## 2016-02-10 NOTE — Care Management Important Message (Signed)
Important Message  Patient Details  Name: Paul Guerra MRN: [id number] Date of Birth: 23-Jul-1935   Medicare Important Message Given:  Yes    Haskell FlirtJamison, Ayo Smoak 02/10/2016, 11:00 AMImportant Message  Patient Details  Name: Paul Guerra MRN: [id number] Date of Birth: 23-Jul-1935   Medicare Important Message Given:  Yes    Haskell FlirtJamison, Derwin Reddy 02/10/2016, 11:00 AM

## 2016-02-10 NOTE — Progress Notes (Signed)
Physical Therapy Treatment Patient Details Name: Paul Guerra MRN: [id number] DOB: 1934-12-25 Today's Date: 02/10/2016    History of Present Illness pt admitted to the hosptial  with acute renal failure  on 01/17/16. On 02/01/16 transfer to ICU forb a fib and  VDRF. Patient was intubated on 4/22 and extubated on 4/25, on 4/26 reintubated, self extubated on 4/27.    PT Comments    The nurse reports that patient was alert earlier. At this visit the patient was lethargic and did not allow PT to assist him to the edge of the bed. Somewhat agitated.   Daughter not present.   Follow Up Recommendations  SNF;Supervision/Assistance - 24 hour unless improves that daughter can provide care.     Equipment Recommendations  Wheelchair (measurements PT)    Recommendations for Other Services       Precautions / Restrictions Precautions Precautions: Fall Precaution Comments: monitor VS    Mobility  Bed Mobility Overal bed mobility: Needs Assistance;+ 2 for safety/equipment;+2 for physical assistance Bed Mobility: Supine to Sit           General bed mobility comments: attempted to manually assist the legs to the bed edge as the patient was not participating. the patient became agitated and pulled legs back onto be.  Transfers                    Ambulation/Gait                 Stairs            Wheelchair Mobility    Modified Rankin (Stroke Patients Only)       Balance                                    Cognition Arousal/Alertness: Lethargic Behavior During Therapy: Agitated                   General Comments: patient barely aroused, became agitated and resistive with attempts to sit up.    Exercises      General Comments        Pertinent Vitals/Pain Pain Assessment: Faces Faces Pain Scale: No hurt    Home Living                      Prior Function            PT Goals (current goals can now be found in the  care plan section) Progress towards PT goals: Not progressing toward goals - comment (not able to participate.)    Frequency  Min 3X/week    PT Plan Discharge plan needs to be updated    Co-evaluation             End of Session   Activity Tolerance: Patient limited by lethargy;Treatment limited secondary to agitation Patient left: in bed;with call bell/phone within reach;with bed alarm set     Time: [id number] PT Time Calculation (min) (ACUTE ONLY): 9 min  Charges:  $Therapeutic Activity: 8-22 mins                    G Codes:      Paul Guerra, Paul Guerra 02/10/2016, 6:33 PM Blanchard KelchKaren Omesha Bowerman PT [phone]

## 2016-02-11 ENCOUNTER — Encounter (HOSPITAL_COMMUNITY): Payer: Self-pay | Admitting: General Surgery

## 2016-02-11 LAB — CBC
HEMATOCRIT: 26 % — AB (ref 39.0–52.0)
HEMOGLOBIN: 9 g/dL — AB (ref 13.0–17.0)
MCH: 30.4 pg (ref 26.0–34.0)
MCHC: 34.6 g/dL (ref 30.0–36.0)
MCV: 87.8 fL (ref 78.0–100.0)
Platelets: 294 10*3/uL (ref 150–400)
RBC: 2.96 MIL/uL — AB (ref 4.22–5.81)
RDW: 13.6 % (ref 11.5–15.5)
WBC: 6.8 10*3/uL (ref 4.0–10.5)

## 2016-02-11 LAB — GLUCOSE, CAPILLARY
GLUCOSE-CAPILLARY: 207 mg/dL — AB (ref 65–99)
GLUCOSE-CAPILLARY: 257 mg/dL — AB (ref 65–99)
Glucose-Capillary: 160 mg/dL — ABNORMAL HIGH (ref 65–99)
Glucose-Capillary: 230 mg/dL — ABNORMAL HIGH (ref 65–99)

## 2016-02-11 LAB — RENAL FUNCTION PANEL
ANION GAP: 12 (ref 5–15)
Albumin: 3.1 g/dL — ABNORMAL LOW (ref 3.5–5.0)
BUN: 68 mg/dL — ABNORMAL HIGH (ref 6–20)
CALCIUM: 8.5 mg/dL — AB (ref 8.9–10.3)
CHLORIDE: 104 mmol/L (ref 101–111)
CO2: 21 mmol/L — AB (ref 22–32)
Creatinine, Ser: 3.71 mg/dL — ABNORMAL HIGH (ref 0.61–1.24)
GFR, EST AFRICAN AMERICAN: 17 mL/min — AB (ref >60)
GFR, EST NON AFRICAN AMERICAN: 14 mL/min — AB (ref >60)
Glucose, Bld: 233 mg/dL — ABNORMAL HIGH (ref 65–99)
Phosphorus: 4.3 mg/dL (ref 2.5–4.6)
Potassium: 3.7 mmol/L (ref 3.5–5.1)
Sodium: 137 mmol/L (ref 135–145)

## 2016-02-11 LAB — PROTIME-INR
INR: 1.24 (ref 0.00–1.49)
Prothrombin Time: 15.7 seconds — ABNORMAL HIGH (ref 11.6–15.2)

## 2016-02-11 NOTE — Consult Note (Signed)
Chief Complaint: acute renal failure  Referring Physician: Dr. Roney Jaffe  Supervising Physician: Aletta Edouard  Patient Status: In-pt   HPI: Paul Guerra is an 81 y.o. male with a history of UTIs and nephrolithiasis who presented to [facility] ED with acute renal failure and possible UTI.  He was TX to [facility] for further care.  He has been treated in the past at [facility] for such issues.  Due to his renal failure a request has been made for a random renal biopsy.  Past Medical History:  Past Medical History  Diagnosis Date  . Recurrent nephrolithiasis 01/17/2016  . Chronic a-fib ([place]) 01/17/2016  . DM (diabetes mellitus) ([place]) 01/17/2016  . BPH (benign prostatic hyperplasia) 01/17/2016  . History of colon cancer 01/17/2016  . Constipation 01/17/2016  . HTN (hypertension), benign 01/17/2016  . Chronic pancreatitis ([place]) 01/17/2016  . Hyperlipidemia 01/17/2016  . Cardiac arrest ([place]) 01/17/2016  . Chewing tobacco use 01/17/2016    Past Surgical History:  Past Surgical History  Procedure Laterality Date  . Cholecystectomy    . Colon surgery      right hemicolectomy  . Cardiac catheterization      cardiac stents  . Transurethral resection of prostate    . Hernia repair Right 09/03/2014    incisional hernia repair/impantation of mesh/ventral hernia repair  . Lithotripsy    . Eye surgery      bilateral cataracts  . Cystoscopy w/ ureteral stent placement Right 01/20/2016    Procedure: CYSTOSCOPY WITH RETROGRADE PYELOGRAM/URETERAL STENT PLACEMENT;  Surgeon: Franchot Gallo, MD;  Location: [facility] ORS;  Service: Urology;  Laterality: Right;    Family History:  Family History  Problem Relation Age of Onset  . Leukemia Father     Social History:  reports that he quit smoking about 37 years ago. His smoking use included Cigarettes. His smokeless tobacco use includes Chew. He reports that he does not drink alcohol or use illicit drugs.  Allergies:  Allergies  Allergen Reactions  .  Nitroglycerin Anaphylaxis  . Penicillins Anaphylaxis, Hives and Shortness Of Breath    Has patient had a PCN reaction causing immediate rash, facial/tongue/throat swelling, SOB or lightheadedness with hypotension: Yes Has patient had a PCN reaction causing severe rash involving mucus membranes or skin necrosis: Yes Has patient had a PCN reaction that required hospitalization Yes Has patient had a PCN reaction occurring within the last 10 years: No If all of the above answers are "NO", then may proceed with Cephalosporin use.   . Sudafed  [Pseudoephedrine Hcl] Hives  . Tetracycline Shortness Of Breath    Other reaction(s): SHORTNESS OF BREATH  . Vancomycin Hives  . Doxycycline Rash    Other reaction(s): RASH  . Sulfa Antibiotics Rash    Medications:   Medication List    ASK your doctor about these medications        ALPRAZolam 0.5 MG tablet  Commonly known as:  XANAX  Take 0.5 mg by mouth 3 (three) times daily as needed for anxiety or sleep.     amitriptyline 25 MG tablet  Commonly known as:  ELAVIL  Take 25 mg by mouth at bedtime.     amLODipine 2.5 MG tablet  Commonly known as:  NORVASC  Take 2.5 mg by mouth daily.     atorvastatin 20 MG tablet  Commonly known as:  LIPITOR  Take 20 mg by mouth daily.     dutasteride 0.5 MG capsule  Commonly known as:  AVODART  Take 0.5 mg by mouth daily.     GARLIC PO  Take 1 capsule by mouth daily.     insulin glargine 100 UNIT/ML injection  Commonly known as:  LANTUS  Inject into the skin at bedtime. Inject 35 units daily increase by 2 units daily until fasting sugars are consistently below 160 [place] 50 units per day     insulin lispro 100 UNIT/ML injection  Commonly known as:  HUMALOG  Inject 7 Units into the skin 3 (three) times daily before meals.     lisinopril 20 MG tablet  Commonly known as:  PRINIVIL,ZESTRIL  Take 20 mg by mouth daily.     metoprolol 50 MG tablet  Commonly known as:  LOPRESSOR  Take 50 mg by mouth 2  (two) times daily.     mineral oil liquid  Take 30 mLs by mouth daily as needed for moderate constipation.     multivitamin with minerals Tabs tablet  Take 1 tablet by mouth daily.     triamcinolone cream 0.1 %  Commonly known as:  KENALOG  Apply 1 application topically 2 (two) times daily as needed (rash).     warfarin 5 MG tablet  Commonly known as:  COUMADIN  Take 5 mg by mouth as directed. Take 37ms daily on Tuesday, Thursday, and Saturday and take 2.536m daily on all other days of the week        Please HPI for pertinent positives, otherwise complete 10 system ROS negative.  Mallampati Score: MD Evaluation Airway: WNL Heart: WNL Abdomen: WNL Chest/ Lungs: WNL ASA  Classification: 3 Mallampati/Airway Score: One  Physical Exam: BP 174/73 mmHg  Pulse 86  Temp(Src) 98.6 F (37 C) (Oral)  Resp 20  Ht 6' 3"  (1.905 m)  Wt 161 lb 2.5 oz (73.1 kg)  BMI 20.14 kg/m2  SpO2 99% Body mass index is 20.14 kg/(m^2). General: pleasant, WD, WN white male who is laying in bed in NAD HEENT: head is normocephalic, atraumatic.  Sclera are noninjected.  PERRL.  Ears and nose without any masses or lesions.  Mouth is pink and moist Heart: regular, rate, and rhythm.  Normal s1,s2. No obvious murmurs, gallops, or rubs noted.  Palpable radial and pedal pulses bilaterally Lungs: CTAB, no wheezes, rhonchi, or rales noted.  Respiratory effort nonlabored Abd: soft, NT, ND, +BS, no masses, hernias, or organomegaly Psych: A&Ox3 with an appropriate affect.   Labs: Results for orders placed or performed during the hospital encounter of 01/17/16 (from the past 48 hour(s))  Glucose, capillary     Status: Abnormal   Collection Time: 02/09/16  4:22 PM  Result Value Ref Range   Glucose-Capillary 109 (H) 65 - 99 mg/dL  Glucose, capillary     Status: Abnormal   Collection Time: 02/09/16  8:45 PM  Result Value Ref Range   Glucose-Capillary 163 (H) 65 - 99 mg/dL  Protime-INR     Status: Abnormal    Collection Time: 02/10/16  4:47 AM  Result Value Ref Range   Prothrombin Time 29.7 (H) 11.6 - 15.2 seconds   INR 2.88 (H) 0.00 - 1.49  CBC     Status: Abnormal   Collection Time: 02/10/16  4:47 AM  Result Value Ref Range   WBC 6.8 4.0 - 10.5 K/uL   RBC 2.80 (L) 4.22 - 5.81 MIL/uL   Hemoglobin 8.3 (L) 13.0 - 17.0 g/dL   HCT 24.9 (L) 39.0 - 52.0 %   MCV 88.9 78.0 - 100.0 fL  MCH 29.6 26.0 - 34.0 pg   MCHC 33.3 30.0 - 36.0 g/dL   RDW 13.7 11.5 - 15.5 %   Platelets 287 150 - 400 K/uL  Renal function panel     Status: Abnormal   Collection Time: 02/10/16  4:47 AM  Result Value Ref Range   Sodium 143 135 - 145 mmol/L   Potassium 3.8 3.5 - 5.1 mmol/L   Chloride 108 101 - 111 mmol/L   CO2 21 (L) 22 - 32 mmol/L   Glucose, Bld 205 (H) 65 - 99 mg/dL   BUN 77 (H) 6 - 20 mg/dL   Creatinine, Ser 4.21 (H) 0.61 - 1.24 mg/dL   Calcium 8.7 (L) 8.9 - 10.3 mg/dL   Phosphorus 4.8 (H) 2.5 - 4.6 mg/dL   Albumin 3.1 (L) 3.5 - 5.0 g/dL   GFR calc non Af Amer 12 (L) >60 mL/min   GFR calc Af Amer 14 (L) >60 mL/min    Comment: (NOTE) The eGFR has been calculated using the CKD EPI equation. This calculation has not been validated in all clinical situations. eGFR's persistently <60 mL/min signify possible Chronic Kidney Disease.    Anion gap 14 5 - 15  Magnesium     Status: None   Collection Time: 02/10/16  4:47 AM  Result Value Ref Range   Magnesium 1.8 1.7 - 2.4 mg/dL  Glucose, capillary     Status: Abnormal   Collection Time: 02/10/16  7:34 AM  Result Value Ref Range   Glucose-Capillary 174 (H) 65 - 99 mg/dL  Glucose, capillary     Status: Abnormal   Collection Time: 02/10/16 11:51 AM  Result Value Ref Range   Glucose-Capillary 209 (H) 65 - 99 mg/dL  Glucose, capillary     Status: Abnormal   Collection Time: 02/10/16  4:42 PM  Result Value Ref Range   Glucose-Capillary 164 (H) 65 - 99 mg/dL  Glucose, capillary     Status: Abnormal   Collection Time: 02/10/16  8:13 PM  Result Value Ref  Range   Glucose-Capillary 160 (H) 65 - 99 mg/dL  Protime-INR     Status: Abnormal   Collection Time: 02/11/16  5:02 AM  Result Value Ref Range   Prothrombin Time 15.7 (H) 11.6 - 15.2 seconds   INR 1.24 0.00 - 1.49  CBC     Status: Abnormal   Collection Time: 02/11/16  5:02 AM  Result Value Ref Range   WBC 6.8 4.0 - 10.5 K/uL   RBC 2.96 (L) 4.22 - 5.81 MIL/uL   Hemoglobin 9.0 (L) 13.0 - 17.0 g/dL   HCT 26.0 (L) 39.0 - 52.0 %   MCV 87.8 78.0 - 100.0 fL   MCH 30.4 26.0 - 34.0 pg   MCHC 34.6 30.0 - 36.0 g/dL   RDW 13.6 11.5 - 15.5 %   Platelets 294 150 - 400 K/uL  Renal function panel     Status: Abnormal   Collection Time: 02/11/16  5:02 AM  Result Value Ref Range   Sodium 137 135 - 145 mmol/L   Potassium 3.7 3.5 - 5.1 mmol/L   Chloride 104 101 - 111 mmol/L   CO2 21 (L) 22 - 32 mmol/L   Glucose, Bld 233 (H) 65 - 99 mg/dL   BUN 68 (H) 6 - 20 mg/dL   Creatinine, Ser 3.71 (H) 0.61 - 1.24 mg/dL   Calcium 8.5 (L) 8.9 - 10.3 mg/dL   Phosphorus 4.3 2.5 - 4.6 mg/dL   Albumin 3.1 (L)  3.5 - 5.0 g/dL   GFR calc non Af Amer 14 (L) >60 mL/min   GFR calc Af Amer 17 (L) >60 mL/min    Comment: (NOTE) The eGFR has been calculated using the CKD EPI equation. This calculation has not been validated in all clinical situations. eGFR's persistently <60 mL/min signify possible Chronic Kidney Disease.    Anion gap 12 5 - 15  Glucose, capillary     Status: Abnormal   Collection Time: 02/11/16  7:22 AM  Result Value Ref Range   Glucose-Capillary 207 (H) 65 - 99 mg/dL   Comment 1 Notify RN    Comment 2 Document in Chart   Glucose, capillary     Status: Abnormal   Collection Time: 02/11/16 12:09 PM  Result Value Ref Range   Glucose-Capillary 160 (H) 65 - 99 mg/dL   Comment 1 Notify RN    Comment 2 Document in Chart     Imaging: No results found.  Assessment/Plan 1. Acute renal failure -will make the patient NPO p MN -INR is down to 1.24 today -BP will need to generally be below 150/90  prior to proceeding with a biopsy due to the significant risk of bleeding with this biopsy and then with an elevated BP. -Risks and Benefits discussed with the patient including, but not limited to bleeding, infection, damage to adjacent structures or low yield requiring additional tests. All of the patient's questions were answered, patient is agreeable to proceed. Consent signed and in chart.   Thank you for this interesting consult.  I greatly enjoyed meeting Paul Guerra and look forward to participating in their care.  A copy of this report was sent to the requesting provider on this date.  Electronically Signed: Henreitta Cea 02/11/2016, 4:11 PM   I spent a total of 40 Minutes    in face to face in clinical consultation, greater than 50% of which was counseling/coordinating care for acute renal failure, request for renal bx

## 2016-02-11 NOTE — Progress Notes (Signed)
Attn: Nephrology or Dr. Arlean HoppingSchertz  Please fill out biopsy form that is on the chart at the nurses station.

## 2016-02-11 NOTE — Progress Notes (Addendum)
ANTICOAGULATION CONSULT NOTE - Follow Up Consult  Pharmacy Consult for Warfarin Indication: atrial fibrillation  Allergies  Allergen Reactions  . Nitroglycerin Anaphylaxis  . Penicillins Anaphylaxis, Hives and Shortness Of Breath    Has patient had a PCN reaction causing immediate rash, facial/tongue/throat swelling, SOB or lightheadedness with hypotension: Yes Has patient had a PCN reaction causing severe rash involving mucus membranes or skin necrosis: Yes Has patient had a PCN reaction that required hospitalization Yes Has patient had a PCN reaction occurring within the last 10 years: No If all of the above answers are "NO", then may proceed with Cephalosporin use.   . Sudafed  [Pseudoephedrine Hcl] Hives  . Tetracycline Shortness Of Breath    Other reaction(s): SHORTNESS OF BREATH  . Vancomycin Hives  . Doxycycline Rash    Other reaction(s): RASH  . Sulfa Antibiotics Rash    Patient Measurements: Height: 6\' 3"  (190.5 cm) Weight: 161 lb 2.5 oz (73.1 kg) IBW/kg (Calculated) : 84.5  Vital Signs: Temp: 98.4 F (36.9 C) (05/02 0519) Temp Source: Oral (05/02 0519) BP: 158/86 mmHg (05/02 0519) Pulse Rate: 61 (05/02 0519)  Labs:  Recent Labs  02/09/16 0440 02/09/16 0444 02/10/16 0447 02/11/16 0502  HGB 8.4*  --  8.3* 9.0*  HCT 25.3*  --  24.9* 26.0*  PLT 285  --  287 294  LABPROT 33.7*  --  29.7* 15.7*  INR 3.41*  --  2.88* 1.24  CREATININE  --  4.64* 4.21* 3.71*    Estimated Creatinine Clearance: 17 mL/min (by C-G formula based on Cr of 3.71).  Assessment: Patient is a 81 yo M with hx colon cancer and atrial fibrillation on warfarin PTA who is currently admitted for acute renal failure, CAP, and UTI. Pharmacy consulted to assist with dosing warfarin. Lovenox bridging dc'd 4/23 stopped as INR became therapeutic.  CHA2DS2-VASc of at least 4 for DM, HTN and 2 points for age.  Home dose is 5mg  T/R/Sat and 2.5mg  all other days  Today, 02/11/2016 - INR dropped  quickly after holding doses for supratherapeutic levels and a dose of 5 mg IV Vitamin K yesterday for renal biopsy today. INR is now subtherapeutic at 1.24, should be able to proceed with biopsy today.  Last dose of warfarin 4/26. - CBC: Hgb low but stable, pltc WNL. - No bleeding documented - Diet: Carb mod - Intake is improved. - Drug interactions:   - amiodarone drip from 4/24 to 4/26.   - broad spectrum antibiotic (primaxin) may increase warfarin sensitivity  Goal of Therapy:  INR 2-3   Plan:  Holding warfarin now.  Waiting on renal biopsy which is supposed to be performed today.  Timing unknown.  Per MD, resume warfarin afterwards.  Renal biopsy is considered a high bleeding risk procedure per IR procedure anticoagulation guidelines so recommendation would be to resume warfarin on the evening of the POD1.  Will plan to resume warfarin tomorrow evening unless instructed otherwise by MD.  Clance BollAmanda Celese Banner, PharmD, BCPS Pager: [phone] 02/11/2016 9:34 AM

## 2016-02-11 NOTE — Progress Notes (Signed)
Inpatient Diabetes Program Recommendations  AACE/ADA: New Consensus Statement on Inpatient Glycemic Control (2015)  Target Ranges:  Prepandial:   less than 140 mg/dL      Peak postprandial:   less than 180 mg/dL (1-2 hours)      Critically ill patients:  140 - 180 mg/dL   Results for [name], Paul Guerra (MRN [id number]) as of 02/11/2016 10:34  Ref. Range 02/10/2016 07:34 02/10/2016 11:51 02/10/2016 16:42 02/10/2016 20:13  Glucose-Capillary Latest Ref Range: 65-99 mg/dL 562174 (H) 130209 (H) 865164 (H) 160 (H)   Results for [name], Paul Guerra (MRN [id number]) as of 02/11/2016 10:34  Ref. Range 02/11/2016 07:22  Glucose-Capillary Latest Ref Range: 65-99 mg/dL 284207 (H)    Home Insulin: Lantus 35 units daily (increase by 2 units QD until fasting <160 mg/dl - Jaythan dose Lantus 50 QD)              Humalog 7 units tidwc  Current Orders: Lantus 10 units daily      Novolog Moderate Correction Scale/ SSI (0-15 units) TID AC/HS      MD- Elevated fasting glucose this AM.    Please consider increasing Lantus to 15 units daily     --Will follow patient during hospitalization--  Ambrose FinlandJeannine Johnston Abu Heavin RN, MSN, CDE Diabetes Coordinator Inpatient Glycemic Control Team Team Pager: [phone] (8a-5p)

## 2016-02-11 NOTE — Clinical Social Work Placement (Signed)
   CLINICAL SOCIAL WORK PLACEMENT  NOTE  Date:  02/11/2016  Patient Details  Name: Dorann LodgeMax B Credit MRN: [id number] Date of Birth: [birth year]  Clinical Social Work is seeking post-discharge placement for this patient at the Skilled  Nursing Facility level of care (*CSW will initial, date and re-position this form in  chart as items are completed):  Yes   Patient/family provided with [facility] Clinical Social Work Department's list of facilities offering this level of care within the geographic area requested by the patient (or if unable, by the patient's family).  Yes   Patient/family informed of their freedom to choose among providers that offer the needed level of care, that participate in Medicare, Medicaid or managed care program needed by the patient, have an available bed and are willing to accept the patient.  Yes   Patient/family informed of [facility]'s ownership interest in [facility] and [facility], as well as of the fact that they are under no obligation to receive care at these facilities.  PASRR submitted to EDS on       PASRR number received on       Existing PASRR number confirmed on 02/11/16     FL2 transmitted to all facilities in geographic area requested by pt/family on 02/11/16     FL2 transmitted to all facilities within larger geographic area on       Patient informed that his/her managed care company has contracts with or will negotiate with certain facilities, including the following:            Patient/family informed of bed offers received.  Patient chooses bed at       Physician recommends and patient chooses bed at      Patient to be transferred to   on  .  Patient to be transferred to facility by       Patient family notified on   of transfer.  Name of family member notified:        PHYSICIAN       Additional Comment:    _______________________________________________ Arlyss RepressHarrison, Natalija Mavis F, LCSW 02/11/2016, 2:39 PM

## 2016-02-11 NOTE — Progress Notes (Signed)
PROGRESS NOTE  Paul Guerra WUJ:[id number] DOB: 1935-03-02 DOA: 01/17/2016 PCP: No primary care provider on file. Outpatient Specialists:    LOS: 25 days   Subjective: Renal biopsy to be done today, INR is 1.2. Restart Coumadin afterwards. Ambulate. Creatinine improved to 3.7 today  Barriers to discharge: Still has elevated creatinine and high urine output.  Brief Narrative: Transfer from [facility] on 01/17/16. Patient is a 81 year old male with history of pancreatitis, DM, HTN, recurrent UTI, recurrent kidney stones, atrial fibrillation, and colon cancer (right hemicolectomy). Presented to [facility] ED with elevated creatinine, probable UTI, and pneumonia. He has been having urinary frequency, dysuria, confusion, and suprapubic abdominal pain over the past month. He saw PCP 2 days prior to admission and was started on ciprofloxacin for UTI, but on his way home his PCP called to tell him to go to ED for IV fluids and antibiotics. Blood pressure was 97/51, CT showed hydronephrosis and kidney stones, and chest x-ray showed pneumonia. Acute renal failure not improving very well, thought to be secondary to ATN.  Patient sent to the ICU on 4/22 after fluid overload and developed acute renal failure, also developed HCAP secondary to pneumonia currently recovering very slowly.  Assessment & Plan:   Acute respiratory failure with hypoxia Patient developed respiratory failure with hypoxia likely secondary to fluid overload which required mechanical ventilation. Patient was intubated on 4/22 and extubated on 4/25, on 4/26 reintubated, self extubated on 427. This is resolved.  Pseudomonas aeruginosa HCAP Tracheal aspirate grew Pseudomonas, patient currently on Primaxin. CXR showed infiltrates in the RML and the RLL. Continue supportive management with bronchodilators, mucolytics, antitussives and oxygen as needed. He is improving, pulmonology recommended antibiotics for 14 days. On antibiotics  since 4/22  Acute renal failure ATN and hydronephrosis related plus or minus ACE inhibitor, I appreciate nephrology's help Creatinine stabilized but no improvement so far, on admission was 3.7, currently creatinine 0.6 down from 5.1 yesterday. Patient s/p cystoscopy with right ureteral stenting 01/20/2016, Hydronephrosis resolved based on last [place]. Still has pyuria (without significant growth), renal biopsy to be done today.  Atrial fibrillation Chronic atrial fibrillation, rate controlled on metoprolol  CHA2DS2-VASc of at least 4 for DM, HTN and 2 points for age. Hold Coumadin for biopsy, restarted afterwards..  Metabolic acidosis Mild metabolic acidosis likely secondary to his acute renal failure.  Chest pain - 01/18/16 patient stated chest pain lasted only 30 seconds with both atypical features. - Patient with prior cardiac history. EKG with atrial fibrillation. Cardiac enzymes negative 3. - Resolved   Urinary tract infection/candiduria Was growing yeast on his urine given Diflucan for 8 days.  Hypertension - Lisinopril stopped secondary to worsening renal function - BP trending higher  Hyperlipidemia - Continue Lipitor  Right hydronephrosis/probable kidney stones - Per CT from outside hospital.  - Renal ultrasound with moderate right hydronephrosis and chronic UPJ stenosis. No evidence of left hydronephrosis area and left lower pole renal calculi. Patient status post cystoscopy and right retrograde pyelogram with double-J stent placement 01/20/2016 - Resolved per urology   Chronic anemia - No overt bleeding. - Status post 2 units PRBC on 4/10  - hb improved and stable since  BPH - Status post Foley catheter placement.  - Continue Avodart.   Diabetes mellitus - Hemoglobin A1c on 4/7 was 9.7.  - CBGs stable now  - Continue Lantus and SSI,   Confusion/combativeness Altered mental status likely secondary to the low-grade temperature he had this morning of  99.6.  Continue Primaxin, CXR repeated showed clear exam, urinalysis pyuria and bacteriuria, is on Primaxin, follow cultures.  Code Status: Full Family Communication: None at bedside Disposition Plan:    Consultants: Urology Nephrology Was under PCCM till 02/07/2016  Procedures: Renal ultrasound 01/18/2016 Chest x-ray 01/17/2016 Renal US 01/18/2016 and 01/24/2016 2 units PRBCs 01/20/2016  Antibiotics: IV Levaquin 01/17/2016  Objective: Filed Vitals:   02/10/16 1426 02/10/16 2018 02/11/16 0519 02/11/16 1057  BP: 149/55 165/67 158/86 165/72  Pulse: 60 65 61   Temp: 97.5 F (36.4 C) 98 F (36.7 C) 98.4 F (36.9 C)   TempSrc: Oral Oral Oral   Resp: 20 20 18    Height:      Weight:   73.1 kg (161 lb 2.5 oz)   SpO2: 100% 100% 99%     Intake/Output Summary (Last 24 hours) at 02/11/16 1209 Last data filed at 02/11/16 0820  Gross per 24 hour  Intake   2590 ml  Output   3125 ml  Net   -535 ml   Filed Weights   02/08/16 0424 02/10/16 0609 02/11/16 0519  Weight: 73.7 kg (162 lb 7.7 oz) 74.1 kg (163 lb 5.8 oz) 73.1 kg (161 lb 2.5 oz)    Examination:  Filed Vitals:   02/10/16 1426 02/10/16 2018 02/11/16 0519 02/11/16 1057  BP: 149/55 165/67 158/86 165/72  Pulse: 60 65 61   Temp: 97.5 F (36.4 C) 98 F (36.7 C) 98.4 F (36.9 C)   TempSrc: Oral Oral Oral   Resp: 20 20 18    Height:      Weight:   73.1 kg (161 lb 2.5 oz)   SpO2: 100% 100% 99%    Constitutional: well nourished, well developed, Alert, awake, Oriented x2  Respiratory: clear to auscultation bilaterally, no wheezing, no crackles. Normal respiratory effort. No accessory muscle use.  Cardiovascular: Regular rate and rhythm. No extremity edema. Abdomen: Soft, non tender, non distended. Bowel sounds positive.  Musculoskeletal: no clubbing / cyanosis.  Neurologic: non focal     Data Reviewed: I have personally reviewed following labs and imaging studies  CBC:  Recent Labs Lab 02/06/16 0318 02/09/16 0440  02/10/16 0447 02/11/16 0502  WBC 5.6 7.6 6.8 6.8  HGB 8.4* 8.4* 8.3* 9.0*  HCT 25.5* 25.3* 24.9* 26.0*  MCV 91.1 88.5 88.9 87.8  PLT 262 285 287 294   Basic Metabolic Panel:  Recent Labs Lab 02/06/16 0318 02/07/16 0324 02/08/16 0423 02/09/16 0440 02/09/16 0444 02/10/16 0447 02/11/16 0502  NA 146* 146* 145  --  146* 143 137  K 3.3* 3.7 4.3  --  3.8 3.8 3.7  CL 108 110 109  --  110 108 104  CO2 22 22 21*  --  20* 21* 21*  GLUCOSE 175* 239* 221*  --  147* 205* 233*  BUN 103* 103* 88*  --  84* 77* 68*  CREATININE 5.74* 5.11* 4.62*  --  4.64* 4.21* 3.71*  CALCIUM 8.8* 9.0 8.9  --  8.7* 8.7* 8.5*  MG 2.1  --   --  1.9  --  1.8  --   PHOS 5.1* 4.4  --   --  4.0 4.8* 4.3   GFR: Estimated Creatinine Clearance: 17 mL/min (by C-G formula based on Cr of 3.71). Liver Function Tests:  Recent Labs Lab 02/06/16 0318 02/07/16 0324 02/09/16 0444 02/10/16 0447 02/11/16 0502  AST 36  --   --   --   --   ALT 32  --   --   --   --  ALKPHOS 68  --   --   --   --   BILITOT 0.6  --   --   --   --   PROT 6.5  --   --   --   --   ALBUMIN 3.0* 2.9* 3.1* 3.1* 3.1*   Coagulation Profile:  Recent Labs Lab 02/07/16 0324 02/08/16 0423 02/09/16 0440 02/10/16 0447 02/11/16 0502  INR 3.04* 3.17* 3.41* 2.88* 1.24   Cardiac Enzymes: No results for input(s): CKTOTAL, CKMB, CKMBINDEX, TROPONINI in the last 168 hours.  Recent Labs Lab 02/10/16 0734 02/10/16 1151 02/10/16 1642 02/10/16 2013 02/11/16 0722  GLUCAP 174* 209* 164* 160* 207*   Urine analysis:    Component Value Date/Time   COLORURINE YELLOW 02/08/2016 1345   APPEARANCEUR CLEAR 02/08/2016 1345   LABSPEC 1.012 02/08/2016 1345   PHURINE 7.0 02/08/2016 1345   GLUCOSEU 250* 02/08/2016 1345   HGBUR MODERATE* 02/08/2016 1345   BILIRUBINUR NEGATIVE 02/08/2016 1345   KETONESUR NEGATIVE 02/08/2016 1345   PROTEINUR 30* 02/08/2016 1345   UROBILINOGEN 0.2 05/30/2011 1144   NITRITE NEGATIVE 02/08/2016 1345   LEUKOCYTESUR  MODERATE* 02/08/2016 1345   Sepsis Labs: Invalid input(s): PROCALCITONIN, LACTICIDVEN  Recent Results (from the past 240 hour(s))  Culture, respiratory (NON-Expectorated)     Status: None   Collection Time: 02/03/16  9:00 PM  Result Value Ref Range Status   Specimen Description TRACHEAL ASPIRATE  Final   Special Requests NONE  Final   Gram Stain   Final    ABUNDANT WBC PRESENT, PREDOMINANTLY PMN NO SQUAMOUS EPITHELIAL CELLS SEEN ABUNDANT GRAM POSITIVE RODS FEW GRAM NEGATIVE RODS Performed at [facility]    Culture   Final    ABUNDANT PSEUDOMONAS AERUGINOSA Performed at [facility]    Report Status 02/07/2016 FINAL  Final   Organism ID, Bacteria PSEUDOMONAS AERUGINOSA  Final      Susceptibility   Pseudomonas aeruginosa - MIC*    CIPROFLOXACIN >=4 RESISTANT Resistant     GENTAMICIN 8 INTERMEDIATE Intermediate     IMIPENEM <=0.25 SENSITIVE Sensitive     PIP/TAZO <=4 SENSITIVE Sensitive     TOBRAMYCIN <=1 SENSITIVE Sensitive     * ABUNDANT PSEUDOMONAS AERUGINOSA  Urine culture     Status: Abnormal   Collection Time: 02/08/16  1:45 PM  Result Value Ref Range Status   Specimen Description URINE, CATHETERIZED  Final   Special Requests NONE  Final   Culture (A)  Final    1,000 COLONIES/mL INSIGNIFICANT GROWTH Performed at [facility]    Report Status 02/10/2016 FINAL  Final  Culture, blood (Routine X 2) w Reflex to ID Panel     Status: None (Preliminary result)   Collection Time: 02/08/16  7:15 PM  Result Value Ref Range Status   Specimen Description BLOOD RIGHT ARM  Final   Special Requests IN PEDIATRIC BOTTLE 2CC  Final   Culture   Final    NO GROWTH 3 DAYS Performed at [facility]    Report Status PENDING  Incomplete  Culture, blood (Routine X 2) w Reflex to ID Panel     Status: None (Preliminary result)   Collection Time: 02/08/16  7:20 PM  Result Value Ref Range Status   Specimen Description BLOOD LEFT ARM  Final   Special  Requests BOTTLES DRAWN AEROBIC ONLY 8CC  Final   Culture   Final    NO GROWTH 3 DAYS Performed at [facility]    Report Status  PENDING  Incomplete      Radiology Studies: No results found.   Scheduled Meds: . amitriptyline  25 mg Oral QHS  . amLODipine  2.5 mg Oral Daily  . antiseptic oral rinse  7 mL Mouth Rinse BID  . atorvastatin  20 mg Oral q1800  . dutasteride  0.5 mg Oral Daily  . feeding supplement (GLUCERNA SHAKE)  237 mL Oral TID BM  . guaiFENesin  1,200 mg Oral BID  . imipenem-cilastatin  250 mg Intravenous Q12H  . insulin aspart  0-15 Units Subcutaneous TID AC & HS  . insulin glargine  10 Units Subcutaneous Daily  . lanthanum  1,000 mg Oral TID WC  . Warfarin - Pharmacist Dosing Inpatient   Does not apply q1800   Continuous Infusions: . dextrose 5 % and 0.2 % NaCl 75 mL/hr at 02/11/16 0154    [facility] HospitalELMAHI,Winferd Wease A, MD [facility] Pager [phone]  If 7PM-7AM, please contact night-coverage [email or web address] Password [email or web address] 02/11/2016, 12:09 PM

## 2016-02-11 NOTE — Clinical Social Work Note (Signed)
Clinical Social Work Assessment  Patient Details  Name: Paul Guerra MRN: [id number] Date of Birth: [birth year]  Date of referral:  02/11/16               Reason for consult:  Facility Placement                Permission sought to share information with:  Chartered certified accountant granted to share information::  Yes, Verbal Permission Granted  Name::        Agency::     Relationship::     Contact Information:     Housing/Transportation Living arrangements for the past 2 months:  Single Family Home Source of Information:  Patient, Adult Children Patient Interpreter Needed:  None Criminal Activity/Legal Involvement Pertinent to Current Situation/Hospitalization:  No - Comment as needed Significant Relationships:  Adult Children Lives with:  Self Do you feel safe going back to the place where you live?  No Need for family participation in patient care:  Yes (Comment)  Care giving concerns:  CSW reviewed PT evaluation recommending SNF at discharge.    Social Worker assessment / plan:  CSW met with patient & daughter, Caren Griffins at bedside re: discharge planning.   Employment status:  Retired Nurse, adult PT Recommendations:  [place] / Referral to community resources:  [place]  Patient/Family's Response to care:  Patient had been to [facility] in the past but daughter states that she would like to see what other facilities have availability. CSW provided [facility] list & will follow-up with SNF bed offers when available.   Patient/Family's Understanding of and Emotional Response to Diagnosis, Current Treatment, and Prognosis:  Patient's daughter is anxious waiting for renal biopsy.   Emotional Assessment Appearance:  Appears stated age Attitude/Demeanor/Rapport:    Affect (typically observed):    Orientation:  Oriented to Self, Oriented to Place, Oriented to  Time, Oriented to  Situation Alcohol / Substance use:    Psych involvement (Current and /or in the community):     Discharge Needs  Concerns to be addressed:    Readmission within the last 30 days:    Current discharge risk:    Barriers to Discharge:      Standley Brooking, LCSW 02/11/2016, 2:37 PM

## 2016-02-11 NOTE — Progress Notes (Signed)
Pharmacy Antibiotic Note  Paul Guerra is a 81 y.o. male admitted on 01/17/2016 [facility] with elevated creatinine, probable UTI, and pneumonia. He was treated with 5 days of levaquin for presumed CAP.  On 4/22, he went into respiration distress after fluid resuscitation and required intubation. Sputum cultures reveal Pseudomonas aeruginosa. Given significant history of PCN allergy, Pharmacy has been consulted for Primaxin dosing.  Today is day #7 Primaxin for planned 14 day course.  SCr continues to slowly improve.  CrCl~16 ml/min/1.6473m2 (normalized).  Plan:  Continue Primaxin 250mg  IV q12h  Monitor renal function closely and adjust as needed  Height: 6\' 3"  (190.5 cm) Weight: 161 lb 2.5 oz (73.1 kg) IBW/kg (Calculated) : 84.5  Temp (24hrs), Avg:98 F (36.7 C), Min:97.5 F (36.4 C), Paul Guerra:98.4 F (36.9 C)   Recent Labs Lab 02/06/16 0318 02/07/16 0324 02/08/16 0423 02/09/16 0440 02/09/16 0444 02/10/16 0447 02/11/16 0502  WBC 5.6  --   --  7.6  --  6.8 6.8  CREATININE 5.74* 5.11* 4.62*  --  4.64* 4.21* 3.71*    Estimated Creatinine Clearance: 17 mL/min (by C-G formula based on Cr of 3.71).    Allergies  Allergen Reactions  . Nitroglycerin Anaphylaxis  . Penicillins Anaphylaxis, Hives and Shortness Of Breath    Has patient had a PCN reaction causing immediate rash, facial/tongue/throat swelling, SOB or lightheadedness with hypotension: Yes Has patient had a PCN reaction causing severe rash involving mucus membranes or skin necrosis: Yes Has patient had a PCN reaction that required hospitalization Yes Has patient had a PCN reaction occurring within the last 10 years: No If all of the above answers are "NO", then may proceed with Cephalosporin use.   . Sudafed  [Pseudoephedrine Hcl] Hives  . Tetracycline Shortness Of Breath    Other reaction(s): SHORTNESS OF BREATH  . Vancomycin Hives  . Doxycycline Rash    Other reaction(s): RASH  . Sulfa Antibiotics  Rash    Antimicrobials this admission: 4/7 >> LVQ >> 4/12 4/7 >> Fluconazole >> 4/17 4/26 >> Primaxin >>  Dose adjustments this admission: 4/7: LVQ adjusted for renal fxn-750mg  x 1, then 500mg  q48h  Microbiology results: 4/7 Strep Pneumo / Legionella UAg: Neg / Neg 4/7 BCx: NGF 4/7 UCx: >100K Yeast (U/A turbid, +bacteria, large WBC) 4/22 MRSA PCR (-) 4/22 bcx x2: NGF 4/22 Trach asp: moderate P. Aeruginosa (S - cefepime, ceftaz, imip, zosyn, tob) (R- cipro, I - gent) 4/24 Trach asp: abundant P. Aeruginosa (S - imip, zosyn, tob) (R - cipro, I - gent) 4/29 BCx x 2: ngtd 4/29 Ucx: 1K colonies/ml, insignificant growth  Thank you for allowing pharmacy to be a part of this patient's care.  Clance BollAmanda Tommi Crepeau, PharmD, BCPS Pager: [phone] 02/11/2016 9:33 AM

## 2016-02-11 NOTE — Progress Notes (Signed)
[facility] Progress Note   Subjective: no complaints  Filed Vitals:   02/10/16 0839 02/10/16 1426 02/10/16 2018 02/11/16 0519  BP: 184/73 149/55 165/67 158/86  Pulse: 73 60 65 61  Temp:  97.5 F (36.4 C) 98 F (36.7 C) 98.4 F (36.9 C)  TempSrc:  Oral Oral Oral  Resp:  Height:      Weight:    73.1 kg (161 lb 2.5 oz)  SpO2:  100% 100% 99%    Inpatient medications: . amitriptyline  25 mg Oral QHS  . amLODipine  2.5 mg Oral Daily  . antiseptic oral rinse  7 mL Mouth Rinse BID  . atorvastatin  20 mg Oral q1800  . dutasteride  0.5 mg Oral Daily  . feeding supplement (GLUCERNA SHAKE)  237 mL Oral TID BM  . guaiFENesin  1,200 mg Oral BID  . imipenem-cilastatin  250 mg Intravenous Q12H  . insulin aspart  0-15 Units Subcutaneous TID AC & HS  . insulin glargine  10 Units Subcutaneous Daily  . lanthanum  1,000 mg Oral TID WC  . Warfarin - Pharmacist Dosing Inpatient   Does not apply q1800   . dextrose 5 % and 0.2 % NaCl 75 mL/hr at 02/11/16 0154   ipratropium, levalbuterol, triamcinolone cream  Presentation: 81 y.o. male history of chronic pancreatitis, diabetes mellitus, BPH, chronic anemia, remote history of colon cancer status post right hemicolectomy, hypertension, hyperlipidemia, recurrent UTIs, recurrent kidney stones, atrial fibrillation on chronic anticoagulation on Coumadin who presented to [facility] emergency department with a elevated creatinine 4 baseline approximately 1.1 w probable UTI and pneumonia. CT showed R hydronephrosis and concern for obstruction. Physician at [facility] spoke with Dr. Thad Ranger urology who accepted the patient in consult.   Creat 3.90 on 4/7, peaked at 5.02 on 4/13, improved to 3.9 on 4/21, peaked again at 5.9 on 4/24, then down to 4.21 on 5/1. Had episode resp failure from iatrogenic vol overload/ pulm edema, resolved, was on vent, also had pseudomonas PNA.   Exam: Alert, calm, no distress, pleasant No  jvd Chest slight rales L base, o/w clear RRR no mrg Abd soft ntnd no ascites/ hsm +bs GU foley in place Ext no edema Neuro no asterixis, nf, O to town, day, hospital, not year  UA 4/7  tntc rbc/ wbc, many bact 30 prot > cx grew yeast >100k UA 4/22 6-30 rbc/ wbc, +yeast, rare bact, prot neg UA  4/29 6-30 rbc/ tntc wbc, many bact, clear, prot 30 > cx to young to read Renal US 4/8 > R hydro prob chron UPJ in absence of stone Renal US 4/14, 4/24 > resolved R hydro CXR 4/29 hazy slight infiltrate on R   Assessment: 1 Renal failure - presented high creat 4.0 (suspected ATN d/t low BP/ acei/ R hydro) vs AIN from cipro). Hasn't gotten any better in 3 weeks. IR to do renal biopsy.  Not grossly uremic, no indication for HD yet. Good UOP.  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 3 Yeast UTI sp Rx 4 Pyuria - persistent, repeat urine Cx pending 5 HTN low dose norvasc 6 Pseudomonas pna 7 DM on insulin  Plan - renal biopsy today, will follow.    Paul Moselle MD [facility] pager [phone]    cell [phone] 02/11/2016, 9:21 AM    Recent Labs Lab 02/09/16 0444 02/10/16 0447 02/11/16 0502  NA 146* 143 137  K 3.8 3.8 3.7  CL  110 108 104  CO2 20* 21* 21*  GLUCOSE 147* 205* 233*  BUN 84* 77* 68*  CREATININE 4.64* 4.21* 3.71*  CALCIUM 8.7* 8.7* 8.5*  PHOS 4.0 4.8* 4.3    Recent Labs Lab 02/06/16 0318  02/09/16 0444 02/10/16 0447 02/11/16 0502  AST 36  --   --   --   --   ALT 32  --   --   --   --   ALKPHOS 68  --   --   --   --   BILITOT 0.6  --   --   --   --   PROT 6.5  --   --   --   --   ALBUMIN 3.0*  < > 3.1* 3.1* 3.1*  < > = values in this interval not displayed.  Recent Labs Lab 02/09/16 0440 02/10/16 0447 02/11/16 0502  WBC 7.6 6.8 6.8  HGB 8.4* 8.3* 9.0*  HCT 25.3* 24.9* 26.0*  MCV 88.5 88.9 87.8  PLT 285 287 294

## 2016-02-11 NOTE — NC FL2 (Signed)
[place] MEDICAID FL2 LEVEL OF CARE SCREENING TOOL     IDENTIFICATION  Patient Name: Paul Guerra Birthdate: Jun 19, 1935 Sex: male Admission Date (Current Location): 01/17/2016  [facility] and [place] Number:  [employer] and Address:  [facility],  [address], [place] [address]      Provider Number: [id number]  Attending Physician Name and Address:  Clydia Llano, MD  Relative Name and Phone Number:       Current Level of Care: Hospital Recommended Level of Care: Skilled Nursing Facility Prior Approval Number:    Date Approved/Denied:   PASRR Number: [id number]  Discharge Plan: Home    Current Diagnoses: Patient Active Problem List   Diagnosis Date Noted  . Pneumonia of right lower lobe due to Pseudomonas species (HCC)   . Pneumonia due to Pseudomonas species (HCC)   . Malnutrition of moderate degree 02/02/2016  . Acute pulmonary edema (HCC)   . AKI (acute kidney injury) (HCC)   . Acute hypoxemic respiratory failure (HCC)   . Chest pain 01/18/2016  . ARF (acute renal failure) (HCC) 01/17/2016  . CAP (community acquired pneumonia) 01/17/2016  . Metabolic acidosis 01/17/2016  . Dehydration 01/17/2016  . Anemia, chronic disease 01/17/2016  . Hydronephrosis 01/17/2016  . Recurrent UTI 01/17/2016  . Recurrent nephrolithiasis 01/17/2016  . Chronic a-fib (HCC) 01/17/2016  . DM (diabetes mellitus) (HCC) 01/17/2016  . BPH (benign prostatic hyperplasia) 01/17/2016  . History of colon cancer 01/17/2016  . Constipation 01/17/2016  . HTN (hypertension), benign 01/17/2016  . Chronic pancreatitis (HCC) 01/17/2016  . Hyperlipidemia 01/17/2016  . Cardiac arrest (HCC) 01/17/2016  . Chewing tobacco use 01/17/2016  . Hydronephrosis of right kidney 01/17/2016    Orientation RESPIRATION BLADDER Height & Weight     Self, Time, Situation, Place  Normal Incontinent, Indwelling catheter Weight: 161 lb 2.5 oz (73.1 kg) Height:   (190.5 cm)  BEHAVIORAL  SYMPTOMS/MOOD NEUROLOGICAL BOWEL NUTRITION STATUS      Continent Diet (Carb Modified)  AMBULATORY STATUS COMMUNICATION OF NEEDS Skin   Extensive Assist Verbally Surgical wounds (Incision(Closed)04/10/17Perineum)                       Personal Care Assistance Level of Assistance  Bathing, Dressing Bathing Assistance: Limited assistance   Dressing Assistance: Limited assistance     Functional Limitations Info             SPECIAL CARE FACTORS FREQUENCY  PT (By licensed PT), OT (By licensed OT)     PT Frequency: 5 OT Frequency: 5            Contractures      Additional Factors Info  Code Status, Allergies Code Status Info: Fullcode Allergies Info: Nitroglycerin, Penicillins, Sudafed , Tetracycline, Vancomycin, Doxycycline, Sulfa Antibiotics           Current Medications (02/11/2016):  This is the current hospital active medication list Current Facility-Administered Medications  Medication Dose Route Frequency Provider Last Rate Last Dose  . amitriptyline (ELAVIL) tablet 25 mg  25 mg Oral QHS Simonne Martinet, NP   25 mg at 02/10/16 2234  . amLODipine (NORVASC) tablet 2.5 mg  2.5 mg Oral Daily Simonne Martinet, NP   2.5 mg at 02/11/16 1058  . antiseptic oral rinse (CPC / CETYLPYRIDINIUM CHLORIDE 0.05%) solution 7 mL  7 mL Mouth Rinse BID Jose Alexis Frock, MD   7 mL at 02/10/16 2234  . atorvastatin (LIPITOR) tablet 20  mg  20 mg Oral q1800 Simonne MartinetPeter E Babcock, NP   20 mg at 02/09/16 1818  . dextrose 5 % and 0.2 % NaCl infusion   Intravenous Continuous Zetta BillsJay Patel, MD 75 mL/hr at 02/11/16 0154    . dutasteride (AVODART) capsule 0.5 mg  0.5 mg Oral Daily Ramiro Harvestaniel Thompson V, MD   0.5 mg at 02/11/16 1058  . feeding supplement (GLUCERNA SHAKE) (GLUCERNA SHAKE) liquid 237 mL  237 mL Oral TID BM Jose Angelo Dorcas McmurrayA de Dios, MD   237 mL at 02/11/16 1057  . guaiFENesin (MUCINEX) 12 hr tablet 1,200 mg  1,200 mg Oral BID Clydia LlanoMutaz Elmahi, MD   1,200 mg at 02/11/16 1058  .  imipenem-cilastatin (PRIMAXIN) 250 mg in sodium chloride 0.9 % 100 mL IVPB  250 mg Intravenous Q12H Rollene Farerin R Williamson, RPH   250 mg at 02/11/16 1058  . insulin aspart (novoLOG) injection 0-15 Units  0-15 Units Subcutaneous TID AC & HS Simonne MartinetPeter E Babcock, NP   3 Units at 02/11/16 1244  . insulin glargine (LANTUS) injection 10 Units  10 Units Subcutaneous Daily Simonne MartinetPeter E Babcock, NP   10 Units at 02/11/16 1057  . ipratropium (ATROVENT) nebulizer solution 0.5 mg  0.5 mg Nebulization Q2H PRN Rodolph Bonganiel Thompson V, MD   0.5 mg at 01/31/16 2254  . lanthanum (FOSRENOL) chewable tablet 1,000 mg  1,000 mg Oral TID WC Jose Alexis FrockAngelo A de Dios, MD   1,000 mg at 02/11/16 1244  . levalbuterol (XOPENEX) nebulizer solution 0.63 mg  0.63 mg Nebulization Q2H PRN Rodolph Bonganiel Thompson V, MD   0.63 mg at 01/31/16 2254  . triamcinolone cream (KENALOG) 0.1 % 1 application  1 application Topical BID PRN Rodolph Bonganiel Thompson V, MD      . Warfarin - Pharmacist Dosing Inpatient   Does not apply q1800 Rodolph Bonganiel Thompson V, MD   Stopped at 02/06/16 1800     Discharge Medications: Please see discharge summary for a list of discharge medications.  Relevant Imaging Results:  Relevant Lab Results:   Additional Information SSN: [id number]  Arlyss RepressHarrison, Cary Lothrop F, LCSW

## 2016-02-12 ENCOUNTER — Inpatient Hospital Stay (HOSPITAL_COMMUNITY): Payer: Medicare Other

## 2016-02-12 LAB — RENAL FUNCTION PANEL
ANION GAP: 11 (ref 5–15)
Albumin: 3.3 g/dL — ABNORMAL LOW (ref 3.5–5.0)
BUN: 59 mg/dL — ABNORMAL HIGH (ref 6–20)
CHLORIDE: 106 mmol/L (ref 101–111)
CO2: 22 mmol/L (ref 22–32)
Calcium: 8.7 mg/dL — ABNORMAL LOW (ref 8.9–10.3)
Creatinine, Ser: 3.63 mg/dL — ABNORMAL HIGH (ref 0.61–1.24)
GFR calc non Af Amer: 15 mL/min — ABNORMAL LOW (ref >60)
GFR, EST AFRICAN AMERICAN: 17 mL/min — AB (ref >60)
GLUCOSE: 174 mg/dL — AB (ref 65–99)
POTASSIUM: 4.2 mmol/L (ref 3.5–5.1)
Phosphorus: 3.8 mg/dL (ref 2.5–4.6)
SODIUM: 139 mmol/L (ref 135–145)

## 2016-02-12 LAB — CBC
HEMATOCRIT: 26.9 % — AB (ref 39.0–52.0)
HEMOGLOBIN: 9.2 g/dL — AB (ref 13.0–17.0)
MCH: 30.1 pg (ref 26.0–34.0)
MCHC: 34.2 g/dL (ref 30.0–36.0)
MCV: 87.9 fL (ref 78.0–100.0)
Platelets: 294 10*3/uL (ref 150–400)
RBC: 3.06 MIL/uL — ABNORMAL LOW (ref 4.22–5.81)
RDW: 13.8 % (ref 11.5–15.5)
WBC: 6.7 10*3/uL (ref 4.0–10.5)

## 2016-02-12 LAB — GLUCOSE, CAPILLARY
GLUCOSE-CAPILLARY: 183 mg/dL — AB (ref 65–99)
GLUCOSE-CAPILLARY: 183 mg/dL — AB (ref 65–99)
GLUCOSE-CAPILLARY: 239 mg/dL — AB (ref 65–99)
Glucose-Capillary: 186 mg/dL — ABNORMAL HIGH (ref 65–99)

## 2016-02-12 LAB — PROTIME-INR
INR: 1.29 (ref 0.00–1.49)
PROTHROMBIN TIME: 15.7 s — AB (ref 11.6–15.2)

## 2016-02-12 MED ORDER — DILTIAZEM LOAD VIA INFUSION
10.0000 mg | INTRAVENOUS | Status: AC
  Administered 2016-02-12: 10 mg via INTRAVENOUS
  Filled 2016-02-12: qty 10

## 2016-02-12 MED ORDER — FENTANYL CITRATE (PF) 100 MCG/2ML IJ SOLN
INTRAMUSCULAR | Status: AC
  Filled 2016-02-12: qty 4

## 2016-02-12 MED ORDER — AMLODIPINE BESYLATE 5 MG PO TABS
5.0000 mg | ORAL_TABLET | Freq: Two times a day (BID) | ORAL | Status: DC
  Administered 2016-02-12 – 2016-02-15 (×6): 5 mg via ORAL
  Filled 2016-02-12 (×6): qty 1

## 2016-02-12 MED ORDER — MIDAZOLAM HCL 2 MG/2ML IJ SOLN
INTRAMUSCULAR | Status: AC
  Filled 2016-02-12: qty 6

## 2016-02-12 MED ORDER — SODIUM CHLORIDE 0.9 % IV BOLUS (SEPSIS)
500.0000 mL | Freq: Once | INTRAVENOUS | Status: AC
  Administered 2016-02-12: 500 mL via INTRAVENOUS

## 2016-02-12 MED ORDER — DILTIAZEM HCL 100 MG IV SOLR
10.0000 mg/h | INTRAVENOUS | Status: DC
  Administered 2016-02-12: 5 mg/h via INTRAVENOUS
  Administered 2016-02-12: 10 mg/h via INTRAVENOUS
  Administered 2016-02-13: 5 mg/h via INTRAVENOUS
  Filled 2016-02-12 (×3): qty 100

## 2016-02-12 MED ORDER — FENTANYL CITRATE (PF) 100 MCG/2ML IJ SOLN
INTRAMUSCULAR | Status: AC | PRN
  Administered 2016-02-12: 50 ug via INTRAVENOUS

## 2016-02-12 MED ORDER — AMIODARONE HCL IN DEXTROSE 360-4.14 MG/200ML-% IV SOLN: 60.0000 mg/h | INTRAVENOUS | Status: DC

## 2016-02-12 MED ORDER — MIDAZOLAM HCL 2 MG/2ML IJ SOLN
INTRAMUSCULAR | Status: AC | PRN
  Administered 2016-02-12: 1 mg via INTRAVENOUS

## 2016-02-12 MED ORDER — HYDRALAZINE HCL 20 MG/ML IJ SOLN
10.0000 mg | INTRAMUSCULAR | Status: DC | PRN
  Administered 2016-02-12 (×2): 10 mg via INTRAVENOUS
  Filled 2016-02-12 (×2): qty 1

## 2016-02-12 MED ORDER — SODIUM CHLORIDE 0.9 % IV SOLN
250.0000 mg | Freq: Two times a day (BID) | INTRAVENOUS | Status: DC
  Administered 2016-02-12 – 2016-02-13 (×2): 250 mg via INTRAVENOUS
  Filled 2016-02-12 (×2): qty 250

## 2016-02-12 MED ORDER — AMIODARONE HCL IN DEXTROSE 360-4.14 MG/200ML-% IV SOLN: 30.0000 mg/h | INTRAVENOUS | Status: DC

## 2016-02-12 MED ORDER — AMIODARONE LOAD VIA INFUSION: 150.0000 mg | Freq: Once | INTRAVENOUS | Status: DC

## 2016-02-12 NOTE — Progress Notes (Signed)
[facility] Progress Note   Subjective: no bx yest as pt had eaten.  No c/o this am, no cough/ SOB.  UOP 1.5L.  Creat 3.6, confused    Filed Vitals:   02/11/16 1350 02/11/16 2220 02/11/16 2221 02/12/16 0633  BP: 174/73 183/77 177/78 167/71  Pulse: 86 58  65  Temp: 98.6 F (37 C) 98.2 F (36.8 C)  98.7 F (37.1 C)  TempSrc: Oral Oral  Oral  Resp: Height:      Weight:    73 kg (160 lb 15 oz)  SpO2: 99% 99%  99%    Inpatient medications: . amitriptyline  25 mg Oral QHS  . amLODipine  5 mg Oral BID  . antiseptic oral rinse  7 mL Mouth Rinse BID  . atorvastatin  20 mg Oral q1800  . dutasteride  0.5 mg Oral Daily  . feeding supplement (GLUCERNA SHAKE)  237 mL Oral TID BM  . guaiFENesin  1,200 mg Oral BID  . imipenem-cilastatin  250 mg Intravenous Q12H  . insulin aspart  0-15 Units Subcutaneous TID AC & HS  . insulin glargine  10 Units Subcutaneous Daily  . lanthanum  1,000 mg Oral TID WC  . Warfarin - Pharmacist Dosing Inpatient   Does not apply q1800   . dextrose 5 % and 0.2 % NaCl 75 mL/hr at 02/12/16 1610   hydrALAZINE, ipratropium, levalbuterol, triamcinolone cream  Presentation: 81 y.o. male history of chronic pancreatitis, diabetes mellitus, BPH, chronic anemia, remote history of colon cancer status post right hemicolectomy, hypertension, hyperlipidemia, recurrent UTIs, recurrent kidney stones, atrial fibrillation on chronic anticoagulation on Coumadin who presented to [facility] emergency department with a elevated creatinine 4 baseline approximately 1.1 w probable UTI and pneumonia. CT showed R hydronephrosis and concern for obstruction. Physician at [facility] spoke with Dr. Thad Ranger urology who accepted the patient in consult.   Creat 3.90 on 4/7, peaked at 5.02 on 4/13, improved to 3.9 on 4/21, peaked again at 5.9 on 4/24, then down to 4.21 on 5/1. Had episode resp failure from iatrogenic vol overload/ pulm edema, resolved, was on vent,  also had pseudomonas PNA.   Exam: Alert, calm, no distress, pleasant No jvd Chest slight rales L base, o/w clear RRR no mrg Abd soft ntnd no ascites/ hsm +bs GU foley in place Ext no edema Neuro no asterixis, nf, O to town, day, hospital, not year  UA 4/7  tntc rbc/ wbc, many bact 30 prot > cx grew yeast >100k UA 4/22 6-30 rbc/ wbc, +yeast, rare bact, prot neg UA  4/29 6-30 rbc/ tntc wbc, many bact, clear, prot 30 > urine cx 1,000 col insig growth Renal US 4/8 > R hydro prob chron UPJ in absence of stone Renal US 4/14, 4/24 > resolved R hydro CXR 4/29 hazy slight infiltrate on R   Assessment: 1 Renal failure - presented high creat 4.0 (suspected ATN d/t low BP/ acei/ R hydro) vs AIN from cipro). Hasn't gotten any better in 3 weeks. IR to do renal biopsy most likely today.  Not grossly uremic, no indication for HD yet. Good UOP.  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 3 Yeast UTI sp Rx 4 Pyuria - persistent, repeat urine Cx 1,000 col insig growth 5 HTN - not good control, ^norvasc 5 bid and added prn IV hydralazine 6 Pseudomonas pna 7 DM on insulin 8 Vol - dec IVF 50/hr, stable vol/ wts  Plan -  renal bx per IR, IV prn hydral, Loletha Grayer^norvasc   Rob Paul Gavel MD [facility] pager [phone]    cell [phone] 02/12/2016, 8:23 AM    Recent Labs Lab 02/10/16 0447 02/11/16 0502 02/12/16 0455  NA 143 137 139  K 3.8 3.7 4.2  CL 108 104 106  CO2 21* 21* 22  GLUCOSE 205* 233* 174*  BUN 77* 68* 59*  CREATININE 4.21* 3.71* 3.63*  CALCIUM 8.7* 8.5* 8.7*  PHOS 4.8* 4.3 3.8    Recent Labs Lab 02/06/16 0318  02/10/16 0447 02/11/16 0502 02/12/16 0455  AST 36  --   --   --   --   ALT 32  --   --   --   --   ALKPHOS 68  --   --   --   --   BILITOT 0.6  --   --   --   --   PROT 6.5  --   --   --   --   ALBUMIN 3.0*  < > 3.1* 3.1* 3.3*  < > = values in this interval not displayed.  Recent Labs Lab 02/10/16 0447 02/11/16 0502 02/12/16 0455  WBC 6.8 6.8  6.7  HGB 8.3* 9.0* 9.2*  HCT 24.9* 26.0* 26.9*  MCV 88.9 87.8 87.9  PLT 287 294 294

## 2016-02-12 NOTE — Progress Notes (Addendum)
Called by Barnetta ChapelKelly Osborne, PAC with IR After Renal Biopsy, Mr.Provencal went into Afib with RVR and dropped his BP into 80s, came up to Floor Pt seen responsive, but BP soft in 80s and Afib with RVR with HR in 140s-150s, will order NS bolus and Amiodarone gtt, Metoprolol was stopped by my partner few days ago due to Bradycardia Will transfer to SDU Repeat Bp is 110/80, will not start Amiodarone and start Cardizem gtt followed by Bolus Cardizem  Zannie CovePreetha Vinod Mikesell, MD

## 2016-02-12 NOTE — Progress Notes (Addendum)
Patient is at baseline  A&O to self. VS are stable 104/52 at start of Cardizem gtt 5 mg/hr. HR sustained 150 AFIB Loading bolus of Cardizem 10 mg completed. Will have primary RN to continue to monitor.

## 2016-02-12 NOTE — Procedures (Signed)
Interventional Radiology Procedure Note  Procedure: US guided random renal biopsy, LEFT.  2x 16G cores  Complications: None immediate  Estimated Blood Loss: 0 mL  Recommendations: - Bedrest x 4 hrs - Pt in new RVR, primary physician alerted  Signed,  Sterling BigHeath K. McCullough, MD

## 2016-02-12 NOTE — Progress Notes (Addendum)
PROGRESS NOTE  Paul Guerra:[id number] DOB: [birth year] DOA: 01/17/2016 PCP: No primary care provider on file. Outpatient Specialists:    LOS: 26 days   Subjective: Renal biopsy to be done today, INR is 1.2. Restart Coumadin afterwards. Ambulate. Creatinine improved to 3.7 today  Barriers to discharge: Still has elevated creatinine and high urine output.  Brief Narrative: Transfer from [facility] on 01/17/16. Patient is a 81 year old male with history of pancreatitis, DM, HTN, recurrent UTI, recurrent kidney stones, atrial fibrillation, and colon cancer (right hemicolectomy). Presented to [facility] ED with elevated creatinine, probable UTI, and pneumonia. He has been having urinary frequency, dysuria, confusion, and suprapubic abdominal pain over the past month. He saw PCP 2 days prior to admission and was started on ciprofloxacin for UTI, but on his way home his PCP called to tell him to go to ED for IV fluids and antibiotics. Blood pressure was 97/51, CT showed hydronephrosis and kidney stones, and chest x-ray showed pneumonia. Acute renal failure not improving very well, thought to be secondary to ATN.  Patient sent to the ICU on 4/22 after fluid overload and developed acute renal failure, also developed HCAP secondary to pneumonia currently recovering very slowly. O2 requirement and Creatinine improving  Assessment & Plan:  Acute respiratory failure with hypoxia Patient developed respiratory failure with hypoxia likely secondary to fluid overload which required mechanical ventilation. Patient was intubated on 4/22 and extubated on 4/25, on 4/26 reintubated, self extubated on 427. This is resolved. -weaned off O2  Pseudomonas aeruginosa HCAP Tracheal aspirate grew Pseudomonas, patient currently on Primaxin. CXR showed infiltrates in the RML and the RLL. -Continue supportive management with bronchodilators, mucolytics, antitussives and oxygen as needed. -completed 8days of IV  PRimaxin since 4/26, stop this now, last CXR 4/29 clear  Acute renal failure ATN and hydronephrosis related plus or minus ACE inhibitor, I appreciate nephrology's help Creatinine stabilized but no improvement so far, on admission was 3.7, currently creatinine 0.6 down from 5.1 yesterday. Patient s/p cystoscopy with right ureteral stenting 01/20/2016, Hydronephrosis resolved based on last [place]. -renal biopsy to be done today. -creatinine has fluctuated a lot this admission, stable from yesterday   Atrial fibrillation Chronic atrial fibrillation, rate controlled, metoprolol held 2days ago due to bradycardia CHA2DS2-VASc of at least 4 for DM, HTN and 2 points for age. Hold Coumadin for biopsy, restarted afterwards..  Metabolic acidosis -resolved  Chest pain - 01/18/16 patient stated chest pain lasted only 30 seconds with both atypical features. - Patient with prior cardiac history. EKG with atrial fibrillation. Cardiac enzymes negative 3. - Resolved   Urinary tract infection/candiduria Was growing yeast on his urine given Diflucan for 8 days.  Hypertension - Lisinopril stopped secondary to worsening renal function - BP trending higher  Hyperlipidemia - Continue Lipitor  Right hydronephrosis/probable kidney stones - Per CT from outside hospital.  - Renal ultrasound with moderate right hydronephrosis and chronic UPJ stenosis. No evidence of left hydronephrosis area and left lower pole renal calculi. Patient status post cystoscopy and right retrograde pyelogram with double-J stent placement 01/20/2016 - Resolved per urology   Chronic anemia - No overt bleeding. - Status post 2 units PRBC on 4/10  - hb improved and stable since  BPH - Status post Foley catheter placement.  - Continue Avodart.   Diabetes mellitus - Hemoglobin A1c on 4/7 was 9.7.  - CBGs stable now  - Continue Lantus and SSI,   DVT proph: warfarin on hold resume post Biopsy  Code Status: Full Family  Communication: daughter at bedside Disposition Plan: SNF pending improving of his kidney function   Consultants: Urology Nephrology Was under PCCM till 02/07/2016  Procedures: Renal ultrasound 01/18/2016 Chest x-ray 01/17/2016 Renal US 01/18/2016 and 01/24/2016 2 units PRBCs 01/20/2016  Antibiotics: IV Levaquin 01/17/2016  Objective: Filed Vitals:   02/12/16 0633 02/12/16 0833 02/12/16 1242 02/12/16 1401  BP: 167/71 156/86 145/58 147/69  Pulse: 65  84 85  Temp: 98.7 F (37.1 C)  98.4 F (36.9 C)   TempSrc: Oral  Oral   Resp: 20  18   Height:      Weight: 73 kg (160 lb 15 oz)     SpO2: 99%  99%     Intake/Output Summary (Last 24 hours) at 02/12/16 1404 Last data filed at 02/11/16 1855  Gross per 24 hour  Intake 1326.25 ml  Output    750 ml  Net 576.25 ml   Filed Weights   02/10/16 0609 02/11/16 0519 02/12/16 0633  Weight: 74.1 kg (163 lb 5.8 oz) 73.1 kg (161 lb 2.5 oz) 73 kg (160 lb 15 oz)    Examination:  Filed Vitals:   02/12/16 0633 02/12/16 0833 02/12/16 1242 02/12/16 1401  BP: 167/71 156/86 145/58 147/69  Pulse: 65  84 85  Temp: 98.7 F (37.1 C)  98.4 F (36.9 C)   TempSrc: Oral  Oral   Resp: 20  18   Height:      Weight: 73 kg (160 lb 15 oz)     SpO2: 99%  99%    Constitutional: well nourished, well developed, Alert, awake, Oriented x2  Respiratory: clear to auscultation bilaterally, no wheezing, no crackles. Normal respiratory effort. No accessory muscle use.  Cardiovascular: Regular rate and rhythm. No extremity edema. Abdomen: Soft, non tender, non distended. Bowel sounds positive.  Musculoskeletal: no clubbing / cyanosis.  Neurologic: non focal     Data Reviewed: I have personally reviewed following labs and imaging studies  CBC:  Recent Labs Lab 02/06/16 0318 02/09/16 0440 02/10/16 0447 02/11/16 0502 02/12/16 0455  WBC 5.6 7.6 6.8 6.8 6.7  HGB 8.4* 8.4* 8.3* 9.0* 9.2*  HCT 25.5* 25.3* 24.9* 26.0* 26.9*  MCV 91.1 88.5 88.9 87.8  87.9  PLT 262 285 287 294 294   Basic Metabolic Panel:  Recent Labs Lab 02/06/16 0318 02/07/16 0324 02/08/16 0423 02/09/16 0440 02/09/16 0444 02/10/16 0447 02/11/16 0502 02/12/16 0455  NA 146* 146* 145  --  146* 143 137 139  K 3.3* 3.7 4.3  --  3.8 3.8 3.7 4.2  CL 108 110 109  --  110 108 104 106  CO2 22 22 21*  --  20* 21* 21* 22  GLUCOSE 175* 239* 221*  --  147* 205* 233* 174*  BUN 103* 103* 88*  --  84* 77* 68* 59*  CREATININE 5.74* 5.11* 4.62*  --  4.64* 4.21* 3.71* 3.63*  CALCIUM 8.8* 9.0 8.9  --  8.7* 8.7* 8.5* 8.7*  MG 2.1  --   --  1.9  --  1.8  --   --   PHOS 5.1* 4.4  --   --  4.0 4.8* 4.3 3.8   GFR: Estimated Creatinine Clearance: 17.3 mL/min (by C-G formula based on Cr of 3.63). Liver Function Tests:  Recent Labs Lab 02/06/16 1610 02/07/16 0324 02/09/16 0444 02/10/16 0447 02/11/16 0502 02/12/16 0455  AST 36  --   --   --   --   --  ALT 32  --   --   --   --   --   ALKPHOS 68  --   --   --   --   --   BILITOT 0.6  --   --   --   --   --   PROT 6.5  --   --   --   --   --   ALBUMIN 3.0* 2.9* 3.1* 3.1* 3.1* 3.3*   Coagulation Profile:  Recent Labs Lab 02/08/16 0423 02/09/16 0440 02/10/16 0447 02/11/16 0502 02/12/16 0455  INR 3.17* 3.41* 2.88* 1.24 1.29   Cardiac Enzymes: No results for input(s): CKTOTAL, CKMB, CKMBINDEX, TROPONINI in the last 168 hours.  Recent Labs Lab 02/11/16 1209 02/11/16 1717 02/11/16 2209 02/12/16 0731 02/12/16 1205  GLUCAP 160* 230* 257* 186* 183*   Urine analysis:    Component Value Date/Time   COLORURINE YELLOW 02/08/2016 1345   APPEARANCEUR CLEAR 02/08/2016 1345   LABSPEC 1.012 02/08/2016 1345   PHURINE 7.0 02/08/2016 1345   GLUCOSEU 250* 02/08/2016 1345   HGBUR MODERATE* 02/08/2016 1345   BILIRUBINUR NEGATIVE 02/08/2016 1345   KETONESUR NEGATIVE 02/08/2016 1345   PROTEINUR 30* 02/08/2016 1345   UROBILINOGEN 0.2 05/30/2011 1144   NITRITE NEGATIVE 02/08/2016 1345   LEUKOCYTESUR MODERATE* 02/08/2016  1345   Sepsis Labs: Invalid input(s): PROCALCITONIN, LACTICIDVEN  Recent Results (from the past 240 hour(s))  Culture, respiratory (NON-Expectorated)     Status: None   Collection Time: 02/03/16  9:00 PM  Result Value Ref Range Status   Specimen Description TRACHEAL ASPIRATE  Final   Special Requests NONE  Final   Gram Stain   Final    ABUNDANT WBC PRESENT, PREDOMINANTLY PMN NO SQUAMOUS EPITHELIAL CELLS SEEN ABUNDANT GRAM POSITIVE RODS FEW GRAM NEGATIVE RODS Performed at [facility]    Culture   Final    ABUNDANT PSEUDOMONAS AERUGINOSA Performed at [facility]    Report Status 02/07/2016 FINAL  Final   Organism ID, Bacteria PSEUDOMONAS AERUGINOSA  Final      Susceptibility   Pseudomonas aeruginosa - MIC*    CIPROFLOXACIN >=4 RESISTANT Resistant     GENTAMICIN 8 INTERMEDIATE Intermediate     IMIPENEM <=0.25 SENSITIVE Sensitive     PIP/TAZO <=4 SENSITIVE Sensitive     TOBRAMYCIN <=1 SENSITIVE Sensitive     * ABUNDANT PSEUDOMONAS AERUGINOSA  Urine culture     Status: Abnormal   Collection Time: 02/08/16  1:45 PM  Result Value Ref Range Status   Specimen Description URINE, CATHETERIZED  Final   Special Requests NONE  Final   Culture (A)  Final    1,000 COLONIES/mL INSIGNIFICANT GROWTH Performed at [facility]    Report Status 02/10/2016 FINAL  Final  Culture, blood (Routine X 2) w Reflex to ID Panel     Status: None (Preliminary result)   Collection Time: 02/08/16  7:15 PM  Result Value Ref Range Status   Specimen Description BLOOD RIGHT ARM  Final   Special Requests IN PEDIATRIC BOTTLE 2CC  Final   Culture   Final    NO GROWTH 3 DAYS Performed at [facility]    Report Status PENDING  Incomplete  Culture, blood (Routine X 2) w Reflex to ID Panel     Status: None (Preliminary result)   Collection Time: 02/08/16  7:20 PM  Result Value Ref Range Status   Specimen Description BLOOD LEFT ARM  Final   Special Requests BOTTLES DRAWN  AEROBIC ONLY 8CC  Final   Culture   Final    NO GROWTH 3 DAYS Performed at [facility]    Report Status PENDING  Incomplete      Radiology Studies: No results found.   Scheduled Meds: . amitriptyline  25 mg Oral QHS  . amLODipine  5 mg Oral BID  . antiseptic oral rinse  7 mL Mouth Rinse BID  . atorvastatin  20 mg Oral q1800  . dutasteride  0.5 mg Oral Daily  . feeding supplement (GLUCERNA SHAKE)  237 mL Oral TID BM  . guaiFENesin  1,200 mg Oral BID  . imipenem-cilastatin  250 mg Intravenous Q12H  . insulin aspart  0-15 Units Subcutaneous TID AC & HS  . insulin glargine  10 Units Subcutaneous Daily  . lanthanum  1,000 mg Oral TID WC  . Warfarin - Pharmacist Dosing Inpatient   Does not apply q1800   Continuous Infusions: . dextrose 5 % and 0.2 % NaCl 50 mL/hr at 02/12/16 0849    Zannie Cove, MD [facility] Pager [phone]  If 7PM-7AM, please contact night-coverage [email or web address] Password [email or web address] 02/12/2016, 2:04 PM

## 2016-02-12 NOTE — Progress Notes (Signed)
ANTICOAGULATION CONSULT NOTE - Follow Up Consult  Pharmacy Consult for Warfarin Indication: atrial fibrillation  Allergies  Allergen Reactions  . Nitroglycerin Anaphylaxis  . Penicillins Anaphylaxis, Hives and Shortness Of Breath    Has patient had a PCN reaction causing immediate rash, facial/tongue/throat swelling, SOB or lightheadedness with hypotension: Yes Has patient had a PCN reaction causing severe rash involving mucus membranes or skin necrosis: Yes Has patient had a PCN reaction that required hospitalization Yes Has patient had a PCN reaction occurring within the last 10 years: No If all of the above answers are "NO", then may proceed with Cephalosporin use.   . Sudafed  [Pseudoephedrine Hcl] Hives  . Tetracycline Shortness Of Breath    Other reaction(s): SHORTNESS OF BREATH  . Vancomycin Hives  . Doxycycline Rash    Other reaction(s): RASH  . Sulfa Antibiotics Rash    Patient Measurements: Height: 6\' 3"  (190.5 cm) Weight: 160 lb 15 oz (73 kg) IBW/kg (Calculated) : 84.5  Vital Signs: Temp: 98.4 F (36.9 C) (05/03 1242) Temp Source: Oral (05/03 1242) BP: 145/58 mmHg (05/03 1242) Pulse Rate: 84 (05/03 1242)  Labs:  Recent Labs  02/10/16 0447 02/11/16 0502 02/12/16 0455  HGB 8.3* 9.0* 9.2*  HCT 24.9* 26.0* 26.9*  PLT 287 294 294  LABPROT 29.7* 15.7* 15.7*  INR 2.88* 1.24 1.29  CREATININE 4.21* 3.71* 3.63*    Estimated Creatinine Clearance: 17.3 mL/min (by C-G formula based on Cr of 3.63).  Assessment: Patient is a 81 yo M with hx colon cancer and atrial fibrillation on warfarin PTA who is currently admitted for acute renal failure, CAP, and UTI. Pharmacy consulted to assist with dosing warfarin. Lovenox bridging dc'd 4/23 stopped as INR became therapeutic.  CHA2DS2-VASc of at least 4 for DM, HTN and 2 points for age.  Home dose is 5mg  T/R/Sat and 2.5mg  all other days  Today, 02/12/2016 - INR sub-therapeutic after holding doses for supratherapeutic  levels and a dose of 5 mg IV Vitamin K yesterday -Plan for renal biopsy today if BP allows.  Last dose of warfarin 4/26. - CBC: Hgb low but stable, pltc WNL. - No bleeding documented - Diet: Carb mod - Intake is improved. - Drug interactions:   - amiodarone drip from 4/24 to 4/26.   - broad spectrum antibiotic (primaxin) may increase warfarin sensitivity  Goal of Therapy:  INR 2-3   Plan:  Holding warfarin until renal biopsy is performed (hopefully 5/3).  Per MD, resume warfarin afterwards.  Renal biopsy is considered a high bleeding risk procedure per IR procedure anticoagulation guidelines so recommendation would be to resume warfarin on the evening of the POD1.  Will plan to resume warfarin tomorrow evening unless instructed otherwise by MD.  Junita PushMichelle Jayleene Glaeser, PharmD, BCPS Pager: [phone] 02/12/2016 1:17 PM

## 2016-02-12 NOTE — Progress Notes (Signed)
PT Cancellation Note  Patient Details Name: Dorann LodgeMax B Gillentine MRN: [id number] DOB: 04-29-1935   Cancelled Treatment:    Reason Eval/Treat Not Completed: Attempted tx session-pt declined to participate on today due to not feeling well (nauseous). Also pt pending procedure today. Will check back another day.    Rebeca AlertJannie Shamra Bradeen, MPT Pager: [phone]

## 2016-02-13 LAB — GLUCOSE, CAPILLARY
GLUCOSE-CAPILLARY: 191 mg/dL — AB (ref 65–99)
Glucose-Capillary: 171 mg/dL — ABNORMAL HIGH (ref 65–99)
Glucose-Capillary: 231 mg/dL — ABNORMAL HIGH (ref 65–99)
Glucose-Capillary: 163 mg/dL — ABNORMAL HIGH (ref 65–99)

## 2016-02-13 LAB — CBC
HEMATOCRIT: 25.1 % — AB (ref 39.0–52.0)
HEMOGLOBIN: 8.6 g/dL — AB (ref 13.0–17.0)
MCH: 30.1 pg (ref 26.0–34.0)
MCHC: 34.3 g/dL (ref 30.0–36.0)
MCV: 87.8 fL (ref 78.0–100.0)
Platelets: 288 10*3/uL (ref 150–400)
RBC: 2.86 MIL/uL — ABNORMAL LOW (ref 4.22–5.81)
RDW: 13.7 % (ref 11.5–15.5)
WBC: 7.3 10*3/uL (ref 4.0–10.5)

## 2016-02-13 LAB — RENAL FUNCTION PANEL
ANION GAP: 10 (ref 5–15)
Albumin: 2.9 g/dL — ABNORMAL LOW (ref 3.5–5.0)
BUN: 60 mg/dL — AB (ref 6–20)
CHLORIDE: 108 mmol/L (ref 101–111)
CO2: 21 mmol/L — AB (ref 22–32)
Calcium: 8.7 mg/dL — ABNORMAL LOW (ref 8.9–10.3)
Creatinine, Ser: 3.54 mg/dL — ABNORMAL HIGH (ref 0.61–1.24)
GFR calc Af Amer: 18 mL/min — ABNORMAL LOW (ref >60)
GFR calc non Af Amer: 15 mL/min — ABNORMAL LOW (ref >60)
GLUCOSE: 197 mg/dL — AB (ref 65–99)
PHOSPHORUS: 4.3 mg/dL (ref 2.5–4.6)
POTASSIUM: 4.3 mmol/L (ref 3.5–5.1)
Sodium: 139 mmol/L (ref 135–145)

## 2016-02-13 LAB — PROTIME-INR
INR: 1.23 (ref 0.00–1.49)
Prothrombin Time: 15.2 seconds (ref 11.6–15.2)

## 2016-02-13 LAB — CULTURE, BLOOD (ROUTINE X 2)
CULTURE: NO GROWTH
Culture: NO GROWTH

## 2016-02-13 MED ORDER — HYDRALAZINE HCL 20 MG/ML IJ SOLN: 10.0000 mg | Freq: Four times a day (QID) | INTRAMUSCULAR | Status: DC | PRN

## 2016-02-13 MED ORDER — WARFARIN SODIUM 6 MG PO TABS
6.0000 mg | ORAL_TABLET | Freq: Once | ORAL | Status: AC
  Administered 2016-02-13: 6 mg via ORAL
  Filled 2016-02-13: qty 1

## 2016-02-13 MED ORDER — METOPROLOL TARTRATE 25 MG PO TABS
25.0000 mg | ORAL_TABLET | Freq: Two times a day (BID) | ORAL | Status: DC
  Administered 2016-02-13 – 2016-02-15 (×5): 25 mg via ORAL
  Filled 2016-02-13 (×5): qty 1

## 2016-02-13 MED ORDER — METOPROLOL TARTRATE 25 MG PO TABS: 25.0000 mg | ORAL_TABLET | Freq: Two times a day (BID) | ORAL | Status: DC

## 2016-02-13 NOTE — Progress Notes (Addendum)
[facility] Progress Note   Subjective: had bx yest, has voided w no hematuria.  Feels good, good UOP, creat no change  Filed Vitals:   02/13/16 0202 02/13/16 0642 02/13/16 0804 02/13/16 0830  BP: 124/61 141/56  149/58  Pulse: 67 70  75  Temp: 98.1 F (36.7 C) 97.5 F (36.4 C)    TempSrc: Oral Oral    Resp: 18 18    Height:      Weight:      SpO2: 100% 98% 100%     Inpatient medications: . amitriptyline  25 mg Oral QHS  . amLODipine  5 mg Oral BID  . antiseptic oral rinse  7 mL Mouth Rinse BID  . atorvastatin  20 mg Oral q1800  . dutasteride  0.5 mg Oral Daily  . feeding supplement (GLUCERNA SHAKE)  237 mL Oral TID BM  . guaiFENesin  1,200 mg Oral BID  . imipenem-cilastatin  250 mg Intravenous Q12H  . insulin aspart  0-15 Units Subcutaneous TID AC & HS  . insulin glargine  10 Units Subcutaneous Daily  . lanthanum  1,000 mg Oral TID WC  . metoprolol tartrate  25 mg Oral BID  . Warfarin - Pharmacist Dosing Inpatient   Does not apply q1800   . dextrose 5 % and 0.2 % NaCl 50 mL/hr at 02/12/16 1841  . diltiazem (CARDIZEM) infusion Stopped (02/13/16 0817)   hydrALAZINE, ipratropium, levalbuterol, triamcinolone cream  Presentation: 81 y.o. male history of chronic pancreatitis, diabetes mellitus, BPH, chronic anemia, remote history of colon cancer status post right hemicolectomy, hypertension, hyperlipidemia, recurrent UTIs, recurrent kidney stones, atrial fibrillation on chronic anticoagulation on Coumadin who [facility] with a elevated creatinine 4 baseline approximately 1.1 w probable UTI and pneumonia. CT showed R hydronephrosis and concern for obstruction. [facility] with Dr. Thad Rangereynolds urology who accepted the patient in consult.   Creat 3.90 on 4/7, peaked at 5.02 on 4/13, improved to 3.9 on 4/21, peaked again at 5.9 on 4/24, then down to 4.21 on 5/1. Had episode resp failure from iatrogenic vol overload/  pulm edema, resolved, was on vent, also had pseudomonas PNA.   Exam: Alert, calm, no distress, pleasant No jvd Chest slight rales L base, o/w clear RRR no mrg Abd soft ntnd no ascites/ hsm +bs GU foley in place Ext no edema Neuro no asterixis, alert, nf  UA 4/7  tntc rbc/ wbc, many bact 30 prot > cx grew yeast >100k UA 4/22 6-30 rbc/ wbc, +yeast, rare bact, prot neg UA  4/29 6-30 rbc/ tntc wbc, many bact, clear, prot 30 > urine cx 1,000 col insig growth Renal US 4/8 > R hydro prob chron UPJ in absence of stone Renal US 4/14, 4/24 > resolved R hydro CXR 4/29 hazy slight infiltrate on R   Assessment: 1 Renal failure - presented high creat 4.0 (suspected ATN d/t low BP/ acei/ R hydro) vs AIN from cipro). Hasn't gotten any better in 3 weeks. Renal bx done 5/3, results pending. Creat stable mid 3's.  2 R hydro resolved after JJ stenting 3 Yeast UTI sp Rx 4 Pyuria - persistent, repeat urine Cx 1,000 col insig growth 5 HTN - not good control, ^norvasc 5 bid, BP better. MTP 25 bid restarted (on 50 bid at home). No acei d/t renal failure 6 Pseudomonas pna 7 DM on insulin 8 Vol - dec IVF 50/hr, stable vol/ wts  Plan - await bx results   Rob  Kelleigh Skerritt MD [facility] pager [phone]    cell [phone] 02/13/2016, 8:50 AM    Recent Labs Lab 02/11/16 0502 02/12/16 0455 02/13/16 0455  NA 137 139 139  K 3.7 4.2 4.3  CL 104 106 108  CO2 21* 22 21*  GLUCOSE 233* 174* 197*  BUN 68* 59* 60*  CREATININE 3.71* 3.63* 3.54*  CALCIUM 8.5* 8.7* 8.7*  PHOS 4.3 3.8 4.3    Recent Labs Lab 02/11/16 0502 02/12/16 0455 02/13/16 0455  ALBUMIN 3.1* 3.3* 2.9*    Recent Labs Lab 02/11/16 0502 02/12/16 0455 02/13/16 0455  WBC 6.8 6.7 7.3  HGB 9.0* 9.2* 8.6*  HCT 26.0* 26.9* 25.1*  MCV 87.8 87.9 87.8  PLT 294 294 288

## 2016-02-13 NOTE — Progress Notes (Signed)
ANTICOAGULATION CONSULT NOTE - Follow Up Consult  Pharmacy Consult for Warfarin Indication: atrial fibrillation  Allergies  Allergen Reactions  . Nitroglycerin Anaphylaxis  . Penicillins Anaphylaxis, Hives and Shortness Of Breath    Has patient had a PCN reaction causing immediate rash, facial/tongue/throat swelling, SOB or lightheadedness with hypotension: Yes Has patient had a PCN reaction causing severe rash involving mucus membranes or skin necrosis: Yes Has patient had a PCN reaction that required hospitalization Yes Has patient had a PCN reaction occurring within the last 10 years: No If all of the above answers are "NO", then may proceed with Cephalosporin use.   . Sudafed  [Pseudoephedrine Hcl] Hives  . Tetracycline Shortness Of Breath    Other reaction(s): SHORTNESS OF BREATH  . Vancomycin Hives  . Doxycycline Rash    Other reaction(s): RASH  . Sulfa Antibiotics Rash    Patient Measurements: Height: 6\' 3"  (190.5 cm) Weight: 160 lb 15 oz (73 kg) IBW/kg (Calculated) : 84.5  Vital Signs: Temp: 97.5 F (36.4 C) (05/04 0642) Temp Source: Oral (05/04 16100642) BP: 149/58 mmHg (05/04 0830) Pulse Rate: 75 (05/04 0830)  Labs:  Recent Labs  02/11/16 0502 02/12/16 0455 02/13/16 0455  HGB 9.0* 9.2* 8.6*  HCT 26.0* 26.9* 25.1*  PLT 294 294 288  LABPROT 15.7* 15.7* 15.2  INR 1.24 1.29 1.23  CREATININE 3.71* 3.63* 3.54*    Estimated Creatinine Clearance: 17.8 mL/min (by C-G formula based on Cr of 3.54).  Assessment: Patient is a 81 yo M with hx colon cancer and atrial fibrillation on warfarin PTA who is currently admitted for acute renal failure, CAP, and UTI. Pharmacy consulted to assist with dosing warfarin. Lovenox bridging dc'd 4/23 stopped as INR became therapeutic.  CHA2DS2-VASc of at least 4 for DM, HTN and 2 points for age.  Home dose is 5mg  T/R/Sat and 2.5mg  all other days  Today, 02/13/2016 - INR sub-therapeutic after holding doses for renal bx.  Last dose  of warfarin 4/26. A dose of 5 mg IV Vitamin K given on 5/2. - Renal biopsy completed 5/3.     - CBC: Hgb low but stable, pltc WNL. - No bleeding documented - Diet: Carb mod - Intake varied (ate 40% of breakfast tray today) - Drug interactions:   - amiodarone drip from 4/24 to 4/26.    Goal of Therapy:  INR 2-3   Plan:  Resume Coumadin 6mg  po x1 tonight Daily INR  Junita PushMichelle Conchetta Lamia, PharmD, BCPS Pager: [phone] 02/13/2016 9:35 AM

## 2016-02-13 NOTE — Progress Notes (Signed)
Pt converted back to sinus rhythm with a heart rate of 66, made on-call provider Schorr aware and was instructed to decrease Cardizem drip to 445ml/hr, and Attending would follow up with care in the am.

## 2016-02-13 NOTE — Progress Notes (Signed)
Patient HR sustaining in the upper 50's.  Pt asymptomatic.  MD aware.  Will continue to monitor closely.

## 2016-02-13 NOTE — Progress Notes (Signed)
Inpatient Diabetes Program Recommendations  AACE/ADA: New Consensus Statement on Inpatient Glycemic Control (2015)  Target Ranges:  Prepandial:   less than 140 mg/dL      Peak postprandial:   less than 180 mg/dL (1-2 hours)      Critically ill patients:  140 - 180 mg/dL   Results for Sedberry, Audria NineMAX B (MRN [id number]) as of 02/13/2016 10:47  Ref. Range 02/12/2016 07:31 02/12/2016 12:05 02/12/2016 17:13 02/12/2016 22:08  Glucose-Capillary Latest Ref Range: 65-99 mg/dL 478186 (H) 295183 (H) 621183 (H) 239 (H)   Results for Roundy, Audria NineMAX B (MRN [id number]) as of 02/13/2016 10:47  Ref. Range 02/13/2016 07:39  Glucose-Capillary Latest Ref Range: 65-99 mg/dL 962171 (H)     Home Insulin: Lantus 35 units daily (increase by 2 units QD until fasting <160 mg/dl - Tayshaun dose Lantus 50 QD)   Humalog 7 units tidwc  Current Orders: Lantus 10 units daily  Novolog Moderate Correction Scale/ SSI (0-15 units) TID AC/HS      MD- Slightly elevated fasting glucose this AM.   Please consider increasing Lantus to 15 units daily     --Will follow patient during hospitalization--  Ambrose FinlandJeannine Johnston Amay Mijangos RN, MSN, CDE Diabetes Coordinator Inpatient Glycemic Control Team Team Pager: [phone] (8a-5p)

## 2016-02-13 NOTE — Progress Notes (Signed)
PROGRESS NOTE  Paul Guerra ZOX:[id number] DOB: 09/21/1935 DOA: 01/17/2016 PCP: No primary care provider on file. Outpatient Specialists:    LOS: 27 days   Subjective: Renal biopsy to be done today, INR is 1.2. Restart Coumadin afterwards. Ambulate. Creatinine improved to 3.7 today  Barriers to discharge: Still has elevated creatinine and high urine output.  Brief Narrative: Transfer from [facility] on 01/17/16. Patient is a 81 year old male with history of pancreatitis, DM, HTN, recurrent UTI, recurrent kidney stones, atrial fibrillation, and colon cancer (right hemicolectomy). Presented to [facility] ED with elevated creatinine, probable UTI, and pneumonia. He has been having urinary frequency, dysuria, confusion, and suprapubic abdominal pain over the past month. He saw PCP 2 days prior to admission and was started on ciprofloxacin for UTI, but on his way home his PCP called to tell him to go to ED for IV fluids and antibiotics. Blood pressure was 97/51, CT showed hydronephrosis and kidney stones, and chest x-ray showed pneumonia. Acute renal failure not improving very well, thought to be secondary to ATN.  Patient sent to the ICU on 4/22 after fluid overload and developed acute renal failure, also developed HCAP secondary to pneumonia currently recovering very slowly. O2 requirement and Creatinine improving 5/3: Renal Biopsy and AFib RVR  Assessment & Plan:  Acute respiratory failure with hypoxia Patient developed respiratory failure with hypoxia likely secondary to fluid overload which required mechanical ventilation. Patient was intubated on 4/22 and extubated on 4/25, on 4/26 reintubated, self extubated on 427. This is resolved. -weaned off O2  Pseudomonas aeruginosa HCAP Tracheal aspirate grew Pseudomonas, patient currently on Primaxin. CXR showed infiltrates in the RML and the RLL. -Continue supportive management with bronchodilators, mucolytics, antitussives and oxygen as  needed. -completed 9days of IV PRimaxin since 4/26, stop this, last CXR 4/29 clear  Acute renal failure ATN and hydronephrosis related plus or minus ACE inhibitor, I appreciate nephrology's help -creatinine on admission was 3.7, then down to 0.6, trended up again. Patient s/p cystoscopy with right ureteral stenting 01/20/2016, Hydronephrosis resolved based on last [place]. -renal biopsy to be done today. -creatinine has fluctuated a lot this admission, stable from yesterday, 3.5 now  Atrial fibrillation -Chronic atrial fibrillation, rate controlled now -went into Afib RVR 5/3: s/p  Cardizem gtt, now off resumed Metoprolol  BID -CHA2DS2-VASc of at least 4  -resume Warfarin today  Metabolic acidosis -resolved  Chest pain - 01/18/16 patient stated chest pain lasted only 30 seconds with both atypical features. - Patient with prior cardiac history. EKG with atrial fibrillation. Cardiac enzymes negative 3. - Resolved   Urinary tract infection/candiduria Was growing yeast on his urine given Diflucan for 8 days.  Hypertension - Lisinopril stopped secondary to worsening renal function - BP trending higher  Right hydronephrosis/probable kidney stones - Per CT from outside hospital.  - Renal ultrasound with moderate right hydronephrosis and chronic UPJ stenosis. No evidence of left hydronephrosis area and left lower pole renal calculi. Patient status post cystoscopy and right retrograde pyelogram with double-J stent placement 01/20/2016 - Resolved per urology   Chronic anemia - No overt bleeding. - Status post 2 units PRBC on 4/10  - hb improved and stable since  BPH - Status post Foley catheter placement.  - Continue Avodart.   Diabetes mellitus - Hemoglobin A1c on 4/7 was 9.7.  - CBGs stable now  - Continue Lantus and SSI,   DVT proph: warfarin on hold resume post Biopsy  Code Status: Full Family Communication: daughter  at bedside Disposition Plan: SNF pending improving of  his kidney function   Consultants: Urology Nephrology Was under PCCM till 02/07/2016  Procedures: Renal ultrasound 01/18/2016 Chest x-ray 01/17/2016 Renal US 01/18/2016 and 01/24/2016 2 units PRBCs 01/20/2016  Antibiotics: IV Levaquin 01/17/2016  Objective: Filed Vitals:   02/13/16 0202 02/13/16 0642 02/13/16 0804 02/13/16 0830  BP: 124/61 141/56  149/58  Pulse: 67 70  75  Temp: 98.1 F (36.7 C) 97.5 F (36.4 C)    TempSrc: Oral Oral    Resp: 18 18    Height:      Weight:      SpO2: 100% 98% 100%     Intake/Output Summary (Last 24 hours) at 02/13/16 1309 Last data filed at 02/13/16 1100  Gross per 24 hour  Intake   2385 ml  Output   2950 ml  Net   -565 ml   Filed Weights   02/10/16 0609 02/11/16 0519 02/12/16 0633  Weight: 74.1 kg (163 lb 5.8 oz) 73.1 kg (161 lb 2.5 oz) 73 kg (160 lb 15 oz)    Examination:  Filed Vitals:   02/13/16 0202 02/13/16 0642 02/13/16 0804 02/13/16 0830  BP: 124/61 141/56  149/58  Pulse: 67 70  75  Temp: 98.1 F (36.7 C) 97.5 F (36.4 C)    TempSrc: Oral Oral    Resp: 18 18    Height:      Weight:      SpO2: 100% 98% 100%    Constitutional: well nourished, well developed, Alert, awake, Oriented x2  Respiratory: clear to auscultation bilaterally, no wheezing, no crackles. Normal respiratory effort. No accessory muscle use.  Cardiovascular: Regular rate and rhythm. No extremity edema. Abdomen: Soft, non tender, non distended. Bowel sounds positive.  Musculoskeletal: no clubbing / cyanosis.  Neurologic: non focal     Data Reviewed: I have personally reviewed following labs and imaging studies  CBC:  Recent Labs Lab 02/09/16 0440 02/10/16 0447 02/11/16 0502 02/12/16 0455 02/13/16 0455  WBC 7.6 6.8 6.8 6.7 7.3  HGB 8.4* 8.3* 9.0* 9.2* 8.6*  HCT 25.3* 24.9* 26.0* 26.9* 25.1*  MCV 88.5 88.9 87.8 87.9 87.8  PLT 285 287 294 294 288   Basic Metabolic Panel:  Recent Labs Lab 02/09/16 0440 02/09/16 0444 02/10/16 0447  02/11/16 0502 02/12/16 0455 02/13/16 0455  NA  --  146* 143 137 139 139  K  --  3.8 3.8 3.7 4.2 4.3  CL  --  110 108 104 106 108  CO2  --  20* 21* 21* 22 21*  GLUCOSE  --  147* 205* 233* 174* 197*  BUN  --  84* 77* 68* 59* 60*  CREATININE  --  4.64* 4.21* 3.71* 3.63* 3.54*  CALCIUM  --  8.7* 8.7* 8.5* 8.7* 8.7*  MG 1.9  --  1.8  --   --   --   PHOS  --  4.0 4.8* 4.3 3.8 4.3   GFR: Estimated Creatinine Clearance: 17.8 mL/min (by C-G formula based on Cr of 3.54). Liver Function Tests:  Recent Labs Lab 02/09/16 0444 02/10/16 0447 02/11/16 0502 02/12/16 0455 02/13/16 0455  ALBUMIN 3.1* 3.1* 3.1* 3.3* 2.9*   Coagulation Profile:  Recent Labs Lab 02/09/16 0440 02/10/16 0447 02/11/16 0502 02/12/16 0455 02/13/16 0455  INR 3.41* 2.88* 1.24 1.29 1.23   Cardiac Enzymes: No results for input(s): CKTOTAL, CKMB, CKMBINDEX, TROPONINI in the last 168 hours.  Recent Labs Lab 02/12/16 1205 02/12/16 1713 02/12/16 2208 02/13/16 0739 02/13/16  1217  GLUCAP 183* 183* 239* 171* 231*   Urine analysis:    Component Value Date/Time   COLORURINE YELLOW 02/08/2016 1345   APPEARANCEUR CLEAR 02/08/2016 1345   LABSPEC 1.012 02/08/2016 1345   PHURINE 7.0 02/08/2016 1345   GLUCOSEU 250* 02/08/2016 1345   HGBUR MODERATE* 02/08/2016 1345   BILIRUBINUR NEGATIVE 02/08/2016 1345   KETONESUR NEGATIVE 02/08/2016 1345   PROTEINUR 30* 02/08/2016 1345   UROBILINOGEN 0.2 05/30/2011 1144   NITRITE NEGATIVE 02/08/2016 1345   LEUKOCYTESUR MODERATE* 02/08/2016 1345   Sepsis Labs: Invalid input(s): PROCALCITONIN, LACTICIDVEN  Recent Results (from the past 240 hour(s))  Culture, respiratory (NON-Expectorated)     Status: None   Collection Time: 02/03/16  9:00 PM  Result Value Ref Range Status   Specimen Description TRACHEAL ASPIRATE  Final   Special Requests NONE  Final   Gram Stain   Final    ABUNDANT WBC PRESENT, PREDOMINANTLY PMN NO SQUAMOUS EPITHELIAL CELLS SEEN ABUNDANT GRAM POSITIVE  RODS FEW GRAM NEGATIVE RODS Performed at [facility]    Culture   Final    ABUNDANT PSEUDOMONAS AERUGINOSA Performed at [facility]    Report Status 02/07/2016 FINAL  Final   Organism ID, Bacteria PSEUDOMONAS AERUGINOSA  Final      Susceptibility   Pseudomonas aeruginosa - MIC*    CIPROFLOXACIN >=4 RESISTANT Resistant     GENTAMICIN 8 INTERMEDIATE Intermediate     IMIPENEM <=0.25 SENSITIVE Sensitive     PIP/TAZO <=4 SENSITIVE Sensitive     TOBRAMYCIN <=1 SENSITIVE Sensitive     * ABUNDANT PSEUDOMONAS AERUGINOSA  Urine culture     Status: Abnormal   Collection Time: 02/08/16  1:45 PM  Result Value Ref Range Status   Specimen Description URINE, CATHETERIZED  Final   Special Requests NONE  Final   Culture (A)  Final    1,000 COLONIES/mL INSIGNIFICANT GROWTH Performed at [facility]    Report Status 02/10/2016 FINAL  Final  Culture, blood (Routine X 2) w Reflex to ID Panel     Status: None (Preliminary result)   Collection Time: 02/08/16  7:15 PM  Result Value Ref Range Status   Specimen Description BLOOD RIGHT ARM  Final   Special Requests IN PEDIATRIC BOTTLE 2CC  Final   Culture   Final    NO GROWTH 4 DAYS Performed at [facility]    Report Status PENDING  Incomplete  Culture, blood (Routine X 2) w Reflex to ID Panel     Status: None (Preliminary result)   Collection Time: 02/08/16  7:20 PM  Result Value Ref Range Status   Specimen Description BLOOD LEFT ARM  Final   Special Requests BOTTLES DRAWN AEROBIC ONLY 8CC  Final   Culture   Final    NO GROWTH 4 DAYS Performed at [facility]    Report Status PENDING  Incomplete      Radiology Studies: [place] Biopsy  02/12/2016  INDICATION: 81 year old male with acute renal failure. EXAM: ULTRASOUND GUIDED RENAL BIOPSY COMPARISON:  None. MEDICATIONS: Fentanyl 50 mcg IV; Versed 1 mg IV ANESTHESIA/SEDATION: Total Moderate Sedation time 5 minutes COMPLICATIONS: None immediate PROCEDURE:  Informed written consent was obtained from the patient after a discussion of the risks, benefits and alternatives to treatment. The patient understands and consents the procedure. A timeout was performed prior to the initiation of the procedure. Ultrasound scanning was performed of the bilateral flanks. The inferior pole of the left kidney was selected for biopsy  due to location and sonographic window. The procedure was planned. The operative site was prepped and draped in the usual sterile fashion. The overlying soft tissues were anesthetized with 1% lidocaine with epinephrine. A 16 gauge core needle biopsy device was advanced into the inferior cortex of the left kidney and 2 core biopsies were obtained under direct ultrasound guidance. Images were saved for documentation purposes. The biopsy device was removed and hemostasis was obtained with manual compression. Post procedural scanning was negative for significant post procedural hemorrhage or additional complication. A dressing was placed. The patient tolerated the procedure well without immediate post procedural complication. IMPRESSION: Technically successful ultrasound guided left renal biopsy. Electronically Signed   By: Malachy Moan M.D.   On: 02/12/2016 17:03     Scheduled Meds: . amitriptyline  25 mg Oral QHS  . amLODipine  5 mg Oral BID  . antiseptic oral rinse  7 mL Mouth Rinse BID  . atorvastatin  20 mg Oral q1800  . dutasteride  0.5 mg Oral Daily  . feeding supplement (GLUCERNA SHAKE)  237 mL Oral TID BM  . guaiFENesin  1,200 mg Oral BID  . insulin aspart  0-15 Units Subcutaneous TID AC & HS  . insulin glargine  10 Units Subcutaneous Daily  . lanthanum  1,000 mg Oral TID WC  . metoprolol tartrate  25 mg Oral BID  . warfarin  6 mg Oral ONCE-1800  . Warfarin - Pharmacist Dosing Inpatient   Does not apply q1800   Continuous Infusions: . dextrose 5 % and 0.2 % NaCl 25 mL/hr at 02/13/16 1001    Declyn Delsol, MD [facility] Pager [phone]  If 7PM-7AM, please contact night-coverage [email or web address] Password [email or web address] 02/13/2016, 1:09 PM

## 2016-02-14 LAB — BASIC METABOLIC PANEL
Anion gap: 9 (ref 5–15)
BUN: 61 mg/dL — AB (ref 6–20)
CALCIUM: 9 mg/dL (ref 8.9–10.3)
CO2: 20 mmol/L — ABNORMAL LOW (ref 22–32)
CREATININE: 3.57 mg/dL — AB (ref 0.61–1.24)
Chloride: 107 mmol/L (ref 101–111)
GFR calc Af Amer: 17 mL/min — ABNORMAL LOW (ref >60)
GFR, EST NON AFRICAN AMERICAN: 15 mL/min — AB (ref >60)
GLUCOSE: 164 mg/dL — AB (ref 65–99)
POTASSIUM: 4.6 mmol/L (ref 3.5–5.1)
SODIUM: 136 mmol/L (ref 135–145)

## 2016-02-14 LAB — GLUCOSE, CAPILLARY
GLUCOSE-CAPILLARY: 168 mg/dL — AB (ref 65–99)
Glucose-Capillary: 266 mg/dL — ABNORMAL HIGH (ref 65–99)
Glucose-Capillary: 131 mg/dL — ABNORMAL HIGH (ref 65–99)
Glucose-Capillary: 256 mg/dL — ABNORMAL HIGH (ref 65–99)

## 2016-02-14 LAB — PROTIME-INR
INR: 1.33 (ref 0.00–1.49)
PROTHROMBIN TIME: 16.1 s — AB (ref 11.6–15.2)

## 2016-02-14 MED ORDER — WARFARIN SODIUM 6 MG PO TABS
6.0000 mg | ORAL_TABLET | Freq: Once | ORAL | Status: AC
  Administered 2016-02-14: 6 mg via ORAL
  Filled 2016-02-14: qty 1

## 2016-02-14 NOTE — Progress Notes (Signed)
Nutrition Follow-up  DOCUMENTATION CODES:   Non-severe (moderate) malnutrition in context of acute illness/injury  INTERVENTION:  -Glucerna Shake po TID, each supplement provides 220 kcal and 10 grams of protein -RD continue to monitor  NUTRITION DIAGNOSIS:   Increased nutrient needs related to chronic illness as evidenced by estimated needs.  Ongoing GOAL:   Patient will meet greater than or equal to 90% of their needs Meeting MONITOR:   PO intake, Diet advancement, Labs, Weight trends, I & O's  REASON FOR ASSESSMENT:   Consult, Ventilator Enteral/tube feeding initiation and management  ASSESSMENT:    81 y.o. male patient with history of chronic pancreatitis, diabetes mellitus, BPH, chronic anemia, remote history of colon cancer status post right hemicolectomy, hypertension, hyperlipidemia, history of recurrent UTIs, history of recurrent kidney stones, history of atrial fibrillation on chronic anticoagulation with Coumadin who [facility] with a elevated creatinine 4 baseline approximately 1.1 and probable UTI and pneumonia. CT stone study protocol which was done showed a right hydronephrosis and concern for obstruction. [facility]ician [facility] spoke with Dr. Thad Rangereynolds urology who accepted the patient in consult. It was noted that patient also had an episode of hypoglycemia which has since resolved. INR there was 4.2. Patient was also noted to have a metabolic acidosis felt to be secondary to acute renal failure.  Briefly followed up with Mr. [name] who was extubated, transferred to tele from ICU. He complains that "the food is lousy." PO intake in chart 75% over last 8 meals. No other acute complaints.  Underwent renal biopsy yesterday. Unable to d/c due to elevated creatinine and urine output.   Disposition:  Awaiting improvement of kidney function then he will be discharged to SNF.  Labs and Medications reviewed. Diet Order:  Diet Heart  Room service appropriate?: Yes; Fluid consistency:: Thin  Skin:  Wound (see comment) (closed perinieal incision)  Last BM:  PTA  Height:   Ht Readings from Last 1 Encounters:  02/05/16 6\' 3"  (1.905 m)    Weight:   Wt Readings from Last 1 Encounters:  02/14/16 157 lb 10.1 oz (71.5 kg)    Ideal Body Weight:  89.1 kg  BMI:  Body mass index is 19.7 kg/(m^2).  Estimated Nutritional Needs:   Kcal:  2000-2200  Protein:  105-115g  Fluid:  2L/day  EDUCATION NEEDS:   No education needs identified at this time  Dionne AnoWilliam M. Maite Burlison, MS, RD LDN After Hours/Weekend Pager [phone]

## 2016-02-14 NOTE — Progress Notes (Signed)
PROGRESS NOTE  Paul Guerra ZOX:[id number] DOB: [birth year] DOA: 01/17/2016 PCP: No primary care provider on file. Outpatient Specialists:    LOS: 28 days  Brief Narrative: Transfer from [facility] on 01/17/16. Patient is a 81 year old male with history of pancreatitis, DM, HTN, recurrent UTI, recurrent kidney stones, atrial fibrillation, and colon cancer (right hemicolectomy). Presented to [facility] ED with elevated creatinine, probable UTI, and pneumonia. He has been having urinary frequency, dysuria, confusion, and suprapubic abdominal pain over the past month. He saw PCP 2 days prior to admission and was started on ciprofloxacin for UTI, but on his way home his PCP called to tell him to go to ED for IV fluids and antibiotics. Blood pressure was 97/51, CT showed hydronephrosis and kidney stones, and chest x-ray showed pneumonia. Acute renal failure not improving very well, thought to be secondary to ATN.  Patient sent to the ICU on 4/22 after fluid overload and developed acute renal failure, also developed HCAP secondary to pneumonia currently recovering very slowly. O2 requirement and Creatinine improving 5/3: Renal Biopsy and AFib RVR  Assessment & Plan:  Acute respiratory failure with hypoxia Patient developed respiratory failure with hypoxia likely secondary to fluid overload which required mechanical ventilation. Patient was intubated on 4/22 and extubated on 4/25, on 4/26 reintubated, self extubated on 427. -resolved. -weaned off O2  Pseudomonas aeruginosa HCAP Tracheal aspirate grew Pseudomonas, patient currently on Primaxin. CXR showed infiltrates in the RML and the RLL. -Continue supportive management with bronchodilators, mucolytics, antitussives and oxygen as needed. -completed 9days of IV PRimaxin since 4/26, stop this, last CXR 4/29 clear  Acute renal failure ATN and hydronephrosis related plus or minus ACE inhibitor, I appreciate nephrology's help -creatinine on admission  was 3.7, then down to 0.6, trended up again. Patient s/p cystoscopy with right ureteral stenting 01/20/2016, Hydronephrosis resolved based on last [place]. -renal biopsy to be done today. -creatinine has fluctuated a lot this admission, stable from yesterday, 3.5 now  Atrial fibrillation -Chronic atrial fibrillation, rate controlled now -went into Afib RVR 5/3: s/p  Cardizem gtt, now off resumed Metoprolol 25mg  BID -CHA2DS2-VASc of at least 4  -resumed Warfarin   Metabolic acidosis -resolved  Chest pain - 01/18/16 patient stated chest pain lasted only 30 seconds with both atypical features. - Patient with prior cardiac history. EKG with atrial fibrillation. Cardiac enzymes negative 3. - Resolved   Urinary tract infection/candiduria Was growing yeast on his urine given Diflucan for 8 days.  Hypertension - Lisinopril stopped secondary to worsening renal function - BP trending higher  Right hydronephrosis/probable kidney stones - Per CT from outside hospital.  - Renal ultrasound with moderate right hydronephrosis and chronic UPJ stenosis. No evidence of left hydronephrosis area and left lower pole renal calculi. Patient status post cystoscopy and right retrograde pyelogram with double-J stent placement 01/20/2016 - Resolved per urology   Chronic anemia - No overt bleeding. - Status post 2 units PRBC on 4/10  - hb improved and stable since  BPH - Status post Foley catheter placement.  - Continue Avodart.   Diabetes mellitus - Hemoglobin A1c on 4/7 was 9.7.  - CBGs stable now  - Continue Lantus and SSI,   DVT proph: warfarin resumed  Code Status: Full Family Communication: daughter at bedside Disposition Plan: SNF pending improving of his kidney function   Consultants: Urology Nephrology Was under PCCM till 02/07/2016  Procedures: Renal ultrasound 01/18/2016 Chest x-ray 01/17/2016 Renal US 01/18/2016 and 01/24/2016 2 units PRBCs 01/20/2016  Antibiotics: IV Levaquin  01/17/2016   Subjective: feels ok, no complaints  Objective: Filed Vitals:   02/13/16 0830 02/13/16 1334 02/13/16 2156 02/14/16 0630  BP: 149/58 117/52 156/62 163/69  Pulse: 75 67 63 58  Temp:  97.9 F (36.6 C) 97.9 F (36.6 C) 99.6 F (37.6 C)  TempSrc:  Oral Oral Oral  Resp:  20    Height:      Weight:    71.5 kg (157 lb 10.1 oz)  SpO2:  100% 99% 97%    Intake/Output Summary (Last 24 hours) at 02/14/16 1332 Last data filed at 02/14/16 0945  Gross per 24 hour  Intake 1187.5 ml  Output   1675 ml  Net -487.5 ml   Filed Weights   02/11/16 0519 02/12/16 0633 02/14/16 0630  Weight: 73.1 kg (161 lb 2.5 oz) 73 kg (160 lb 15 oz) 71.5 kg (157 lb 10.1 oz)    Examination:  Filed Vitals:   02/13/16 0830 02/13/16 1334 02/13/16 2156 02/14/16 0630  BP: 149/58 117/52 156/62 163/69  Pulse: 75 67 63 58  Temp:  97.9 F (36.6 C) 97.9 F (36.6 C) 99.6 F (37.6 C)  TempSrc:  Oral Oral Oral  Resp:  20    Height:      Weight:    71.5 kg (157 lb 10.1 oz)  SpO2:  100% 99% 97%   Constitutional: well nourished, well developed, Alert, awake, Oriented x2  Respiratory: clear to auscultation bilaterally, no wheezing, no crackles. Normal respiratory effort. No accessory muscle use.  Cardiovascular: Regular rate and rhythm. No extremity edema. Abdomen: Soft, non tender, non distended. Bowel sounds positive.  Musculoskeletal: no clubbing / cyanosis.  Neurologic: non focal     Data Reviewed: I have personally reviewed following labs and imaging studies  CBC:  Recent Labs Lab 02/09/16 0440 02/10/16 0447 02/11/16 0502 02/12/16 0455 02/13/16 0455  WBC 7.6 6.8 6.8 6.7 7.3  HGB 8.4* 8.3* 9.0* 9.2* 8.6*  HCT 25.3* 24.9* 26.0* 26.9* 25.1*  MCV 88.5 88.9 87.8 87.9 87.8  PLT 285 287 294 294 288   Basic Metabolic Panel:  Recent Labs Lab 02/09/16 0440 02/09/16 0444 02/10/16 0447 02/11/16 0502 02/12/16 0455 02/13/16 0455 02/14/16 0545  NA  --  146* 143 137 139 139 136  K  --   3.8 3.8 3.7 4.2 4.3 4.6  CL  --  110 108 104 106 108 107  CO2  --  20* 21* 21* 22 21* 20*  GLUCOSE  --  147* 205* 233* 174* 197* 164*  BUN  --  84* 77* 68* 59* 60* 61*  CREATININE  --  4.64* 4.21* 3.71* 3.63* 3.54* 3.57*  CALCIUM  --  8.7* 8.7* 8.5* 8.7* 8.7* 9.0  MG 1.9  --  1.8  --   --   --   --   PHOS  --  4.0 4.8* 4.3 3.8 4.3  --    GFR: Estimated Creatinine Clearance: 17.2 mL/min (by C-G formula based on Cr of 3.57). Liver Function Tests:  Recent Labs Lab 02/09/16 0444 02/10/16 0447 02/11/16 0502 02/12/16 0455 02/13/16 0455  ALBUMIN 3.1* 3.1* 3.1* 3.3* 2.9*   Coagulation Profile:  Recent Labs Lab 02/10/16 0447 02/11/16 0502 02/12/16 0455 02/13/16 0455 02/14/16 0545  INR 2.88* 1.24 1.29 1.23 1.33   Cardiac Enzymes: No results for input(s): CKTOTAL, CKMB, CKMBINDEX, TROPONINI in the last 168 hours.  Recent Labs Lab 02/13/16 1217 02/13/16 1626 02/13/16 2203 02/14/16 0750 02/14/16 1201  GLUCAP 231*  163* 191* 168* 266*   Urine analysis:    Component Value Date/Time   COLORURINE YELLOW 02/08/2016 1345   APPEARANCEUR CLEAR 02/08/2016 1345   LABSPEC 1.012 02/08/2016 1345   PHURINE 7.0 02/08/2016 1345   GLUCOSEU 250* 02/08/2016 1345   HGBUR MODERATE* 02/08/2016 1345   BILIRUBINUR NEGATIVE 02/08/2016 1345   KETONESUR NEGATIVE 02/08/2016 1345   PROTEINUR 30* 02/08/2016 1345   UROBILINOGEN 0.2 05/30/2011 1144   NITRITE NEGATIVE 02/08/2016 1345   LEUKOCYTESUR MODERATE* 02/08/2016 1345   Sepsis Labs: Invalid input(s): PROCALCITONIN, LACTICIDVEN  Recent Results (from the past 240 hour(s))  Urine culture     Status: Abnormal   Collection Time: 02/08/16  1:45 PM  Result Value Ref Range Status   Specimen Description URINE, CATHETERIZED  Final   Special Requests NONE  Final   Culture (A)  Final    1,000 COLONIES/mL INSIGNIFICANT GROWTH Performed at [facility]    Report Status 02/10/2016 FINAL  Final  Culture, blood (Routine X 2) w Reflex to ID  Panel     Status: None   Collection Time: 02/08/16  7:15 PM  Result Value Ref Range Status   Specimen Description BLOOD RIGHT ARM  Final   Special Requests IN PEDIATRIC BOTTLE 2CC  Final   Culture   Final    NO GROWTH 5 DAYS Performed at [facility]    Report Status 02/13/2016 FINAL  Final  Culture, blood (Routine X 2) w Reflex to ID Panel     Status: None   Collection Time: 02/08/16  7:20 PM  Result Value Ref Range Status   Specimen Description BLOOD LEFT ARM  Final   Special Requests BOTTLES DRAWN AEROBIC ONLY 8CC  Final   Culture   Final    NO GROWTH 5 DAYS Performed at [facility]    Report Status 02/13/2016 FINAL  Final      Radiology Studies: Koreas Biopsy  02/12/2016  INDICATION: 81 year old male with acute renal failure. EXAM: ULTRASOUND GUIDED RENAL BIOPSY COMPARISON:  None. MEDICATIONS: Fentanyl 50 mcg IV; Versed 1 mg IV ANESTHESIA/SEDATION: Total Moderate Sedation time 5 minutes COMPLICATIONS: None immediate PROCEDURE: Informed written consent was obtained from the patient after a discussion of the risks, benefits and alternatives to treatment. The patient understands and consents the procedure. A timeout was performed prior to the initiation of the procedure. Ultrasound scanning was performed of the bilateral flanks. The inferior pole of the left kidney was selected for biopsy due to location and sonographic window. The procedure was planned. The operative site was prepped and draped in the usual sterile fashion. The overlying soft tissues were anesthetized with 1% lidocaine with epinephrine. A 16 gauge core needle biopsy device was advanced into the inferior cortex of the left kidney and 2 core biopsies were obtained under direct ultrasound guidance. Images were saved for documentation purposes. The biopsy device was removed and hemostasis was obtained with manual compression. Post procedural scanning was negative for significant post procedural hemorrhage or  additional complication. A dressing was placed. The patient tolerated the procedure well without immediate post procedural complication. IMPRESSION: Technically successful ultrasound guided left renal biopsy. Electronically Signed   By: Malachy MoanHeath  McCullough M.D.   On: 02/12/2016 17:03     Scheduled Meds: . amitriptyline  25 mg Oral QHS  . amLODipine  5 mg Oral BID  . antiseptic oral rinse  7 mL Mouth Rinse BID  . atorvastatin  20 mg Oral q1800  . dutasteride  0.5  mg Oral Daily  . feeding supplement (GLUCERNA SHAKE)  237 mL Oral TID BM  . guaiFENesin  1,200 mg Oral BID  . insulin aspart  0-15 Units Subcutaneous TID AC & HS  . insulin glargine  10 Units Subcutaneous Daily  . lanthanum  1,000 mg Oral TID WC  . metoprolol tartrate  25 mg Oral BID  . warfarin  6 mg Oral ONCE-1800  . Warfarin - Pharmacist Dosing Inpatient   Does not apply q1800   Continuous Infusions: . dextrose 5 % and 0.2 % NaCl 25 mL/hr at 02/13/16 2158    Zannie Cove, MD [facility] Pager [phone]  If 7PM-7AM, please contact night-coverage [email or web address] Password [email or web address] 02/14/2016, 1:32 PM

## 2016-02-14 NOTE — Progress Notes (Signed)
ANTICOAGULATION CONSULT NOTE - Follow Up Consult  Pharmacy Consult for Warfarin Indication: atrial fibrillation  Allergies  Allergen Reactions  . Nitroglycerin Anaphylaxis  . Penicillins Anaphylaxis, Hives and Shortness Of Breath    Has patient had a PCN reaction causing immediate rash, facial/tongue/throat swelling, SOB or lightheadedness with hypotension: Yes Has patient had a PCN reaction causing severe rash involving mucus membranes or skin necrosis: Yes Has patient had a PCN reaction that required hospitalization Yes Has patient had a PCN reaction occurring within the last 10 years: No If all of the above answers are "NO", then may proceed with Cephalosporin use.   . Sudafed  [Pseudoephedrine Hcl] Hives  . Tetracycline Shortness Of Breath    Other reaction(s): SHORTNESS OF BREATH  . Vancomycin Hives  . Doxycycline Rash    Other reaction(s): RASH  . Sulfa Antibiotics Rash    Patient Measurements: Height: 6\' 3"  (190.5 cm) Weight: 157 lb 10.1 oz (71.5 kg) IBW/kg (Calculated) : 84.5  Vital Signs: Temp: 99.6 F (37.6 C) (05/05 0630) Temp Source: Oral (05/05 0630) BP: 163/69 mmHg (05/05 0630) Pulse Rate: 58 (05/05 0630)  Labs:  Recent Labs  02/12/16 0455 02/13/16 0455 02/14/16 0545  HGB 9.2* 8.6*  --   HCT 26.9* 25.1*  --   PLT 294 288  --   LABPROT 15.7* 15.2 16.1*  INR 1.29 1.23 1.33  CREATININE 3.63* 3.54* 3.57*    Estimated Creatinine Clearance: 17.2 mL/min (by C-G formula based on Cr of 3.57).  Assessment: Patient is a 81 yo M with hx colon cancer and atrial fibrillation on warfarin PTA who is currently admitted for acute renal failure, CAP, and UTI. Pharmacy consulted to assist with dosing warfarin. Lovenox bridging dc'd 4/23 stopped as INR became therapeutic.  CHA2DS2-VASc of at least 4 for DM, HTN and 2 points for age.  Home dose is 5mg  T/R/Sat and 2.5mg  all other days  Today, 02/14/2016 - INR sub-therapeutic after holding doses for renal bx.   Warfarin resumed 5/4.  A dose of 5 mg IV Vitamin K given on 5/2 and will likely effect INR for a few days. - Renal biopsy completed 5/3.  Results pending. - CBC: Hgb low but stable, pltc WNL yesterday - No bleeding documented - Diet: Carb mod - eating - Drug interactions:   - amiodarone drip from 4/24 to 4/26   Goal of Therapy:  INR 2-3   Plan:  Repeat Coumadin 6mg  po x1 tonight Daily INR  Clance BollAmanda Marcelo Ickes, PharmD, BCPS Pager: [phone] 02/14/2016 10:39 AM

## 2016-02-14 NOTE — Progress Notes (Addendum)
[facility] Progress Note   Subjective: no change  Filed Vitals:   02/13/16 1334 02/13/16 2156 02/14/16 0630 02/14/16 1406  BP: 117/52 156/62 163/69 150/55  Pulse: 67 63 58 64  Temp: 97.9 F (36.6 C) 97.9 F (36.6 C) 99.6 F (37.6 C) 98.4 F (36.9 C)  TempSrc: Oral Oral Oral Oral  Resp: 20   18  Height:      Weight:   71.5 kg (157 lb 10.1 oz)   SpO2: 100% 99% 97% 100%    Inpatient medications: . amitriptyline  25 mg Oral QHS  . amLODipine  5 mg Oral BID  . antiseptic oral rinse  7 mL Mouth Rinse BID  . atorvastatin  20 mg Oral q1800  . dutasteride  0.5 mg Oral Daily  . feeding supplement (GLUCERNA SHAKE)  237 mL Oral TID BM  . guaiFENesin  1,200 mg Oral BID  . insulin aspart  0-15 Units Subcutaneous TID AC & HS  . insulin glargine  10 Units Subcutaneous Daily  . lanthanum  1,000 mg Oral TID WC  . metoprolol tartrate  25 mg Oral BID  . warfarin  6 mg Oral ONCE-1800  . Warfarin - Pharmacist Dosing Inpatient   Does not apply q1800   . dextrose 5 % and 0.2 % NaCl 25 mL/hr at 02/13/16 2158   hydrALAZINE, ipratropium, levalbuterol, triamcinolone cream  Presentation: 81 y.o. male history of chronic pancreatitis, diabetes mellitus, BPH, chronic anemia, remote history of colon cancer status post right hemicolectomy, hypertension, hyperlipidemia, recurrent UTIs, recurrent kidney stones, atrial fibrillation on chronic anticoagulation on Coumadin who presented to [facility] emergency department with a elevated creatinine 4 baseline approximately 1.1 w probable UTI and pneumonia. CT showed R hydronephrosis and concern for obstruction. Physician at [facility] spoke with Dr. Thad Ranger urology who accepted the patient in consult.   Creat 3.90 on 4/7, peaked at 5.02 on 4/13, improved to 3.9 on 4/21, peaked again at 5.9 on 4/24, then down to 4.21 on 5/1. Had episode resp failure from iatrogenic vol overload/ pulm edema, resolved, was on vent, also had pseudomonas PNA.    Exam: Alert, calm, no distress, pleasant No jvd Chest slight rales L base, o/w clear RRR no mrg Abd soft ntnd no ascites/ hsm +bs GU foley in place Ext no edema Neuro no asterixis, alert, nf  UA 4/7  tntc rbc/ wbc, many bact 30 prot > cx grew yeast >100k UA 4/22 6-30 rbc/ wbc, +yeast, rare bact, prot neg UA  4/29 6-30 rbc/ tntc wbc, many bact, clear, prot 30 > urine cx 1,000 col insig growth Renal US 4/8 > R hydro prob chron UPJ in absence of stone Renal US 4/14, 4/24 > resolved R hydro CXR 4/29 hazy slight infiltrate on R   Assessment: 1 Renal failure - presented high creat 4's, now stable function but not improving. Creat mid 3's. Renal bx showing severe ATN, no other sig glom / IS pathology, does have severe arteriosclerosis.  Overall though this means has chance still of recovering function and supportive care , avoidance of nephrotoxins/ hypotension, hypoperfusion is all that is needed for now.  2 R hydro resolved after JJ stenting 3 Yeast UTI sp Rx 4 Pyuria - persistent, repeat urine Cx 1,000 col insig growth 5 HTN - not good control, ^norvasc 5 bid, BP better. MTP 25 bid restarted (on 50 bid at home). No acei d/t renal failure 6 Pseudomonas pna 7 DM on insulin 8 Vol - dec IVF 50/hr,  stable vol/ wts  Plan - as above. DC foley   Vinson Moselleob Daylin Gruszka MD [facility] pager [phone]    cell [phone] 02/14/2016, 4:08 PM    Recent Labs Lab 02/11/16 0502 02/12/16 0455 02/13/16 0455 02/14/16 0545  NA 137 139 139 136  K 3.7 4.2 4.3 4.6  CL 104 106 108 107  CO2 21* 22 21* 20*  GLUCOSE 233* 174* 197* 164*  BUN 68* 59* 60* 61*  CREATININE 3.71* 3.63* 3.54* 3.57*  CALCIUM 8.5* 8.7* 8.7* 9.0  PHOS 4.3 3.8 4.3  --     Recent Labs Lab 02/11/16 0502 02/12/16 0455 02/13/16 0455  ALBUMIN 3.1* 3.3* 2.9*    Recent Labs Lab 02/11/16 0502 02/12/16 0455 02/13/16 0455  WBC 6.8 6.7 7.3  HGB 9.0* 9.2* 8.6*  HCT 26.0* 26.9* 25.1*  MCV 87.8 87.9 87.8  PLT  294 294 288

## 2016-02-14 NOTE — Progress Notes (Signed)
CSW following for SNF placement at discharge. [facility] - CSW spoke with Beth at [facility] to make aware of preference & will follow-up closer to discharge. Per Waynetta SandyBeth they should have a bed for patient if discharging early next week. Awaiting renal clearance for discharge per Dr. Jomarie LongsJoseph.    Lincoln MaxinKelly Hazael Olveda, LCSW [facility] Clinical Social Worker cell #: [phone]

## 2016-02-14 NOTE — Care Management Important Message (Signed)
Important Message  Patient Details  Name: Paul LodgeMax B Bergerson MRN: [id number] Date of Birth: 1935/08/09   Medicare Important Message Given:  Yes    Haskell FlirtJamison, Afshin Chrystal 02/14/2016, 11:05 AMImportant Message  Patient Details  Name: Paul LodgeMax B Limehouse MRN: [id number] Date of Birth: 1935/08/09   Medicare Important Message Given:  Yes    Haskell FlirtJamison, Maccoy Haubner 02/14/2016, 11:05 AM

## 2016-02-15 LAB — PROTIME-INR
INR: 1.38 (ref 0.00–1.49)
Prothrombin Time: 16.6 seconds — ABNORMAL HIGH (ref 11.6–15.2)

## 2016-02-15 LAB — GLUCOSE, CAPILLARY
Glucose-Capillary: 103 mg/dL — ABNORMAL HIGH (ref 65–99)
Glucose-Capillary: 291 mg/dL — ABNORMAL HIGH (ref 65–99)

## 2016-02-15 LAB — BASIC METABOLIC PANEL
Anion gap: 9 (ref 5–15)
BUN: 67 mg/dL — AB (ref 6–20)
CHLORIDE: 106 mmol/L (ref 101–111)
CO2: 21 mmol/L — AB (ref 22–32)
CREATININE: 3.68 mg/dL — AB (ref 0.61–1.24)
Calcium: 8.9 mg/dL (ref 8.9–10.3)
GFR calc Af Amer: 17 mL/min — ABNORMAL LOW (ref >60)
GFR calc non Af Amer: 14 mL/min — ABNORMAL LOW (ref >60)
Glucose, Bld: 112 mg/dL — ABNORMAL HIGH (ref 65–99)
POTASSIUM: 4.4 mmol/L (ref 3.5–5.1)
Sodium: 136 mmol/L (ref 135–145)

## 2016-02-15 MED ORDER — METOPROLOL TARTRATE 25 MG PO TABS: 25.0000 mg | ORAL_TABLET | Freq: Two times a day (BID) | ORAL | Status: DC

## 2016-02-15 MED ORDER — AMLODIPINE BESYLATE 5 MG PO TABS: 5.0000 mg | ORAL_TABLET | Freq: Every day | ORAL | Status: AC

## 2016-02-15 MED ORDER — INSULIN ASPART 100 UNIT/ML ~~LOC~~ SOLN: 0.0000 [IU] | Freq: Three times a day (TID) | SUBCUTANEOUS | Status: DC

## 2016-02-15 MED ORDER — WARFARIN SODIUM 2.5 MG PO TABS: 7.5000 mg | ORAL_TABLET | Freq: Once | ORAL | Status: DC

## 2016-02-15 MED ORDER — WARFARIN SODIUM 5 MG PO TABS: 7.5000 mg | ORAL_TABLET | ORAL | Status: DC

## 2016-02-15 MED ORDER — INSULIN GLARGINE 100 UNIT/ML ~~LOC~~ SOLN: 10.0000 [IU] | Freq: Every day | SUBCUTANEOUS | Status: AC

## 2016-02-15 NOTE — Clinical Social Work Placement (Addendum)
[facility] is set to [facility] today. Patient & daughter, Paul Guerra made aware. Discharge packet given to RN, Amil AmenJulia. Patient's daughter, Paul Guerra to transport to SNF.   RN to call report to: [phone]    Lincoln MaxinKelly Arrick Dutton, LCSW [facility] Clinical Social Worker cell #: [phone]    CLINICAL SOCIAL WORK PLACEMENT  NOTE  Date:  02/15/2016  Patient Details  Name: Paul Guerra MRN: [id number] Date of Birth: 09-22-1935  Clinical Social Work is seeking post-discharge placement for this patient at the Skilled  Nursing Facility level of care (*CSW will initial, date and re-position this form in  chart as items are completed):  Yes   Patient/family provided with [facility] Clinical Social Work Department's list of facilities offering this level of care within the geographic area requested by the patient (or if unable, by the patient's family).  Yes   Patient/family informed of their freedom to choose among providers that offer the needed level of care, that participate in Medicare, Medicaid or managed care program needed by the patient, have an available bed and are willing to accept the patient.  Yes   Patient/family informed of [facility]'s ownership interest in [facility] and [facility], as well as of the fact that they are under no obligation to receive care at these facilities.  PASRR submitted to EDS on       PASRR number received on       Existing PASRR number confirmed on 02/11/16     FL2 transmitted to all facilities in geographic area requested by pt/family on 02/11/16     FL2 transmitted to all facilities within larger geographic area on       Patient informed that his/her managed care company has contracts with or will negotiate with certain facilities, including the following:        Yes   Patient/family informed of bed offers received.  [facility]     Physician recommends and  patient chooses bed at      Patient to be [facility] on 02/15/16.  Patient to be transferred to facility by patient's daughter, Paul Guerra     Patient family notified on 02/15/16 of transfer.  Name of family member notified:  patient's daughter, Paul Guerra via phone     PHYSICIAN       Additional Comment:    _______________________________________________ Arlyss RepressHarrison, Rogelio Waynick F, LCSW 02/15/2016, 1:01 PM

## 2016-02-15 NOTE — Progress Notes (Signed)
[facility] Progress Note   Subjective: no change  Filed Vitals:   02/14/16 0630 02/14/16 1406 02/14/16 2021 02/15/16 0425  BP: 163/69 150/55 159/57 128/59  Pulse: 58 64 52 57  Temp: 99.6 F (37.6 C) 98.4 F (36.9 C) 98 F (36.7 C) 97.7 F (36.5 C)  TempSrc: Oral Oral Oral Oral  Resp:  18 18 16   Height:      Weight: 71.5 kg (157 lb 10.1 oz)   72.1 kg (158 lb 15.2 oz)  SpO2: 97% 100% 100% 100%    Inpatient medications: . amitriptyline  25 mg Oral QHS  . amLODipine  5 mg Oral BID  . antiseptic oral rinse  7 mL Mouth Rinse BID  . atorvastatin  20 mg Oral q1800  . dutasteride  0.5 mg Oral Daily  . feeding supplement (GLUCERNA SHAKE)  237 mL Oral TID BM  . guaiFENesin  1,200 mg Oral BID  . insulin aspart  0-15 Units Subcutaneous TID AC & HS  . insulin glargine  10 Units Subcutaneous Daily  . lanthanum  1,000 mg Oral TID WC  . metoprolol tartrate  25 mg Oral BID  . Warfarin - Pharmacist Dosing Inpatient   Does not apply q1800     hydrALAZINE, ipratropium, levalbuterol, triamcinolone cream  Presentation: 81 y.o. male history of chronic pancreatitis, diabetes mellitus, BPH, chronic anemia, remote history of colon cancer status post right hemicolectomy, hypertension, hyperlipidemia, recurrent UTIs, recurrent kidney stones, atrial fibrillation on chronic anticoagulation on Coumadin who [facility] with a elevated creatinine 4 baseline approximately 1.1 w probable UTI and pneumonia. CT showed R hydronephrosis and concern for obstruction. [facility] with Dr. Thad [facility] urology who accepted the patient in consult.   Creat 3.90 on 4/7, peaked at 5.02 on 4/13, improved to 3.9 on 4/21, peaked again at 5.9 on 4/24, then down to 4.21 on 5/1. Had episode resp failure from iatrogenic vol overload/ pulm edema, resolved, was on vent, also had pseudomonas PNA.   Exam: Alert, calm, no distress, pleasant No jvd Chest slight  rales L base, o/w clear RRR no mrg Abd soft ntnd no ascites/ hsm +bs GU foley in place Ext no edema Neuro no asterixis, alert, nf  UA 4/7  tntc rbc/ wbc, many bact 30 prot > cx grew yeast >100k UA 4/22 6-30 rbc/ wbc, +yeast, rare bact, prot neg UA  4/29 6-30 rbc/ tntc wbc, many bact, clear, prot 30 > urine cx 1,000 col insig growth Renal US 4/8 > R hydro prob chron UPJ in absence of stone Renal US 4/14, 4/24 > resolved R hydro CXR 4/29 hazy slight infiltrate on R   Assessment: 1 Renal failure - presented high creat 4's, now stable function but not improving. Creat mid 3's. Renal bx showed severe ATN, no glom pathology. Did show severe arteriosclerosis but no significant IS scarring so this shouldn't affect chances of recovery.  We will arrange for outpatient renal f/u in our clinic in 2-4 weeks.  We will contact patient and/or SNF depending on where he ends up.  Please put in d/c summary to > - avoid all nsaids/ ACE inhibitors/ ARB's/ contrast indefinitely - avoid overtreatment of HTN, keep SBP > 110 minimum  2 R hydro resolved after JJ stenting 3 Yeast UTI sp Rx 4 Pyuria - persistent, repeat urine Cx 1,000 col insig growth 5 HTN - stable on norvasc/ MTP 6 Pseudomonas pna 7 DM on insulin 8 Vol - IVF dc'd  Plan - will sign off   Vinson Moselle MD [facility] pager [phone]    cell [phone] 02/15/2016, 9:01 AM    Recent Labs Lab 02/11/16 0502 02/12/16 0455 02/13/16 0455 02/14/16 0545 02/15/16 0526  NA 137 139 139 136 136  K 3.7 4.2 4.3 4.6 4.4  CL 104 106 108 107 106  CO2 21* 22 21* 20* 21*  GLUCOSE 233* 174* 197* 164* 112*  BUN 68* 59* 60* 61* 67*  CREATININE 3.71* 3.63* 3.54* 3.57* 3.68*  CALCIUM 8.5* 8.7* 8.7* 9.0 8.9  PHOS 4.3 3.8 4.3  --   --     Recent Labs Lab 02/11/16 0502 02/12/16 0455 02/13/16 0455  ALBUMIN 3.1* 3.3* 2.9*    Recent Labs Lab 02/11/16 0502 02/12/16 0455 02/13/16 0455  WBC 6.8 6.7 7.3  HGB 9.0* 9.2* 8.6*  HCT  26.0* 26.9* 25.1*  MCV 87.8 87.9 87.8  PLT 294 294 288

## 2016-02-15 NOTE — Discharge Summary (Signed)
Physician Discharge Summary  Paul Guerra ZOX:[id number] DOB: July 01, 1935 DOA: 01/17/2016  PCP: No primary care provider on file.  Admit date: 01/17/2016 Discharge date: 02/15/2016  Time spent: 45 minutes  Recommendations for Outpatient Follow-up:  1. Bmet weekly, Fax this to [facility] 2. Check INR in 2days, titrate warfarin dose for INR 2-3, subtherapeutic at discharge 3. Renal/[facility], office will call with FU 4. Avoid all nsaids/ ACE inhibitors/ ARB's/ contrast indefinitely/avoid overtreatment of HTN, keep SBP > 110 minimum 5. FU with Urology Dr.Dahlstedt in 28month  Discharge Diagnoses:  Principal Problem:   ARF (acute renal failure) (HCC) Active Problems:   CAP (community acquired pneumonia)   Metabolic acidosis   Dehydration   Anemia, chronic disease   Hydronephrosis   Recurrent UTI   Recurrent nephrolithiasis   Chronic a-fib (HCC)   DM (diabetes mellitus) (HCC)   BPH (benign prostatic hyperplasia)   Constipation   HTN (hypertension), benign   Hyperlipidemia   Hydronephrosis of right kidney   Chest pain   AKI (acute kidney injury) (HCC)   Acute hypoxemic respiratory failure (HCC)   Acute pulmonary edema (HCC)   Malnutrition of moderate degree   Pneumonia due to Pseudomonas species (HCC)   Pneumonia of right lower lobe due to Pseudomonas species [facility])   Discharge Condition: stable  Diet recommendation: low sodium, diabetic  Filed Weights   02/12/16 0633 02/14/16 0630 02/15/16 0425  Weight: 73 kg (160 lb 15 oz) 71.5 kg (157 lb 10.1 oz) 72.1 kg (158 lb 15.2 oz)    History of present illness:  Chief Complaint: ARF/Hydronephrosis HPI: Paul Guerra is a 81 y.o. male with history of chronic pancreatitis, diabetes mellitus, BPH, chronic anemia, remote history of colon cancer status post right hemicolectomy, hypertension, hyperlipidemia, history of recurrent UTIs, history of recurrent kidney stones, history of atrial fibrillation on chronic  anticoagulation with Coumadin who presented to [facility] emergency department with a elevated creatinine 4 baseline approximately 1.1 and probable UTI and pneumonia. CT stone study protocol which was done showed a right hydronephrosis and concern for obstruction, was transfered to [facility] long for Urology and Renal evaluation  Hospital Course:  Acute respiratory failure with hypoxia Patient developed respiratory failure with hypoxia likely secondary to fluid overload which required mechanical ventilation in the middle of his hospitalization Patient was intubated on 4/22 and extubated on 4/25, on 4/26 reintubated, self extubated on 427. -resolved. -weaned off O2  Pseudomonas aeruginosa HCAP -while on Vent,Tracheal aspirate grew Pseudomonas, treated with Primaxin. CXR showed infiltrates in the RML and the RLL. -improved with Abx,  with bronchodilators, mucolytics, antitussives and oxygen as needed. -completed 9days of IV PRimaxin since 4/26, stopped after 9 days, last CXR 4/29 clear  Acute renal failure -ATN and hydronephrosis related plus ACE inhibitor, I appreciate nephrology's help -creatinine on admission was 3.7, then down to 0.6, trended up again. -he underwent cystoscopy with right ureteral stenting 01/20/2016, Hydronephrosis resolved based on last [place]. -renal biopsy completed 5/3: Renal bx showed severe ATN, no glomerular pathology, severe arteriosclerosis -creatinine has fluctuated a lot this admission, now stabilized in the mid 3 range, making very good amounts of urine, followed closely by Renal -Per Renal Dr.Schertz, since no glomerular pathology, only severe ATN, we are hopeful of Renal recovery, per Renal, they will  arrange for outpatient renal f/u in the clinic in 2-4 weeks. They will contact patient and/or SNF depending on where he ends up. -Recommended to  avoid all nsaids/ ACE inhibitors/ ARB's/ contrast  indefinitely - avoid overtreatment of HTN, keep SBP > 110 minimum  Atrial  fibrillation -Chronic atrial fibrillation, rate controlled now -went into Afib RVR 5/3: s/p Cardizem gtt, now off resumed Metoprolol 25mg  BID -CHA2DS2-VASc of at least 4 , resumed Warfarin after Renal biopsy, INR subtherapeutic now, Warfarin dose higher till INR 2-3range, please check INR in 2days  Metabolic acidosis -resolved  Chest pain - 01/18/16 patient stated chest pain lasted only 30 seconds with both atypical features. - Patient with prior cardiac history. EKG with atrial fibrillation. Cardiac enzymes negative 3. - Resolved   Urinary tract infection/candiduria -Was growing yeast on his urine, completed Diflucan x 8 days.  Hypertension - Lisinopril stopped secondary to worsening renal function - continue metoprolol and amlodipine, avoid over Rx of HTN  Right hydronephrosis/probable kidney stones - Per CT from outside hospital.  - Renal ultrasound with moderate right hydronephrosis and chronic UPJ stenosis. No evidence of left hydronephrosis area and left lower pole renal calculi. Patient status post cystoscopy and right retrograde pyelogram with double-J stent placement 01/20/2016 - hydronephrosis resolved, FU with Urology Dr.Dahlstedt  Chronic anemia - No overt bleeding. - Status post 2 units PRBC on 4/10  - hb improved and stable since  BPH - Status post Foley catheter placement, foley removed and urinating without difficulty - Continue Avodart.   Diabetes mellitus - Hemoglobin A1c on 4/7 was 9.7.  - CBGs stable now  - Continue Lantus and SSI,   Procedures: R ureteral JJ stents for R hydronephrosis on 4/10 Renal Biopsy 5/3  Consultations:  Renal Dr.Schertz  Urology  PCCM  Discharge Exam: Filed Vitals:   02/15/16 0425 02/15/16 1051  BP: 128/59 145/93  Pulse: 57 62  Temp: 97.7 F (36.5 C)   Resp: 16     General: AAOx3, very hard of hearing Cardiovascular: S1S2/RRR Respiratory: CTAB  Discharge Instructions   Discharge Instructions    Diet  - low sodium heart healthy    Complete by:  As directed      Diet Carb Modified    Complete by:  As directed      Increase activity slowly    Complete by:  As directed           Current Discharge Medication List    START taking these medications   Details  insulin aspart (NOVOLOG) 100 UNIT/ML injection Inject 0-15 Units into the skin 4 (four) times daily -  before meals and at bedtime.      CONTINUE these medications which have CHANGED   Details  amLODipine (NORVASC) 5 MG tablet Take 1 tablet (5 mg total) by mouth daily.    insulin glargine (LANTUS) 100 UNIT/ML injection Inject 0.1 mLs (10 Units total) into the skin daily. Qty: 10 mL, Refills: 11    metoprolol (LOPRESSOR) 25 MG tablet Take 1 tablet (25 mg total) by mouth 2 (two) times daily.    warfarin (COUMADIN) 5 MG tablet Take 1.5 tablets (7.5 mg total) by mouth as directed. TAKE 7.5MG  DAILY TILL INR >2, THEN TITRATE down to keep INR>2      CONTINUE these medications which have NOT CHANGED   Details  amitriptyline (ELAVIL) 25 MG tablet Take 25 mg by mouth at bedtime.    atorvastatin (LIPITOR) 20 MG tablet Take 20 mg by mouth daily.    dutasteride (AVODART) 0.5 MG capsule Take 0.5 mg by mouth daily.    mineral oil liquid Take 30 mLs by mouth daily as needed for moderate constipation.  Multiple Vitamin (MULTIVITAMIN WITH MINERALS) TABS tablet Take 1 tablet by mouth daily.    triamcinolone cream (KENALOG) 0.1 % Apply 1 application topically 2 (two) times daily as needed (rash).       STOP taking these medications     ALPRAZolam (XANAX) 0.5 MG tablet      GARLIC PO      insulin lispro (HUMALOG) 100 UNIT/ML injection      lisinopril (PRINIVIL,ZESTRIL) 20 MG tablet        Allergies  Allergen Reactions  . Nitroglycerin Anaphylaxis  . Penicillins Anaphylaxis, Hives and Shortness Of Breath    Has patient had a PCN reaction causing immediate rash, facial/tongue/throat swelling, SOB or lightheadedness with  hypotension: Yes Has patient had a PCN reaction causing severe rash involving mucus membranes or skin necrosis: Yes Has patient had a PCN reaction that required hospitalization Yes Has patient had a PCN reaction occurring within the last 10 years: No If all of the above answers are "NO", then may proceed with Cephalosporin use.   . Sudafed  [Pseudoephedrine Hcl] Hives  . Tetracycline Shortness Of Breath    Other reaction(s): SHORTNESS OF BREATH  . Vancomycin Hives  . Doxycycline Rash    Other reaction(s): RASH  . Sulfa Antibiotics Rash   Follow-up Information    Call Anner Crete, MD.   Specialty:  Urology   Why:  Please call the office for an appoint the first week of May to have the stent removed.     Contact information:   [address] [place] [address] [phone]       Please follow up.   Why:  HHPT/OT/aide   Contact information:   [facility] tel#[phone] fax#[phone]       The results of significant diagnostics from this hospitalization (including imaging, microbiology, ancillary and laboratory) are listed below for reference.    Significant Diagnostic Studies: Dg Chest 1 View  02/02/2016  CLINICAL DATA:  Pulmonary edema EXAM: CHEST 1 VIEW COMPARISON:  02/01/2016 FINDINGS: Endotracheal tube terminates 5.5 cm above the carina. Improving interstitial edema. Layering small bilateral pleural effusions. No pneumothorax. The heart is normal in size. Enteric tube courses below the diaphragm. IMPRESSION: Endotracheal tube terminates 5.5 cm above the carina. Improving interstitial edema. Layering small bilateral pleural effusions. Electronically Signed   By: Charline Bills M.D.   On: 02/02/2016 11:12   X-ray Chest Pa And Lateral  01/17/2016  CLINICAL DATA:  Anemia and chronic atrial fibrillation. Acute renal insufficiency. EXAM: CHEST  2 VIEW COMPARISON:  January 16, 2016 FINDINGS: There is persistent patchy infiltrate in the right lower lobe, stable. There  is subtle new opacity in the right upper lobe, likely extension of pneumonia. Left lung is clear. Heart size and pulmonary vascularity are normal. No adenopathy. No bone lesions. There is atherosclerotic calcification in the aorta. IMPRESSION: Right lower lobe infiltrate, stable. Subtle patchy opacity right upper lobe, felt to represent mild extension of pneumonia. Left lung remains clear. No change in cardiac silhouette. Electronically Signed   By: Bretta Bang III M.D.   On: 01/17/2016 16:16   Dg Abd 1 View  02/01/2016  CLINICAL DATA:  Evaluate orogastric tube placement. Intubated patient. Acute renal failure. EXAM: ABDOMEN - 1 VIEW COMPARISON:  CT, 01/16/2016 FINDINGS: Oral/ nasogastric tube tip lies within the mid stomach. There are multiple densities in the visualized abdomen consistent with flecks if of residual barium. No evidence of bowel obstruction. IMPRESSION: Oral/nasogastric tube  tip lies within the mid stomach. Electronically Signed   By: Amie Portland M.D.   On: 02/01/2016 10:46   US Renal  02/03/2016  CLINICAL DATA:  Right hydronephrosis EXAM: RENAL / URINARY TRACT ULTRASOUND COMPLETE COMPARISON:  01/24/2016 and previous FINDINGS: Right Kidney: Length: 10.6 cm. Isoechoic to adjacent liver. 10 x 8 x 9 mm probable cyst, inferior pole. No hydronephrosis. Left Kidney: Length: 9 cm. Echogenicity within normal limits. No mass or hydronephrosis visualized. Bladder: Decompressed by Foley catheter. IMPRESSION: 1. No hydronephrosis. 2. Small right lower pole renal cyst. Electronically Signed   By: Corlis Leak M.D.   On: 02/03/2016 15:53   US Renal  01/24/2016  CLINICAL DATA:  Acute kidney injury EXAM: RENAL / URINARY TRACT ULTRASOUND COMPLETE COMPARISON:  01/18/2016 FINDINGS: Right Kidney: Length: 10.6 cm. Increased echotexture. Small 10 mm cyst in the lower pole. Previously seen hydronephrosis has resolved. Left Kidney: Length: 10.2 cm. Echogenicity within normal limits. No mass or hydronephrosis  visualized. Bladder: Foley catheter in place. IMPRESSION: Resolution of previously seen right hydronephrosis. Increased echotexture in the right kidney. Small benign-appearing cyst in the lower pole of the right kidney. Electronically Signed   By: Charlett Nose M.D.   On: 01/24/2016 08:23   US Renal  01/18/2016  CLINICAL DATA:  81 year old presenting with acute renal failure. Current history of diabetes and hypertension. Current history of benign prostatic hypertrophy. EXAM: RENAL / URINARY TRACT ULTRASOUND COMPLETE COMPARISON:  CT abdomen and pelvis 01/16/2016.  No prior ultrasound. FINDINGS: Right Kidney: Length: Approximately 10.9 cm. Moderate hydronephrosis as noted on the CT, though no obstructing ureteral calculus was identified on that examination. Well-preserved cortex for age. Normal parenchymal echotexture. No focal parenchymal abnormality. No shadowing calculi. Left Kidney: Length: Approximately 10.0 cm. No hydronephrosis. Well-preserved cortex for age. Normal parenchymal echotexture. No focal parenchymal abnormality. Shadowing calculi arising from the lower pole as noted on the CT. Bladder: Decompressed by Foley catheter. IMPRESSION: 1. Moderate right hydronephrosis as noted on the CT 2 days ago. 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 2. No evidence of left hydronephrosis. 3. Left lower pole renal calculi as noted on CT. 4. Well preserved renal cortex for patient age. Electronically Signed   By: Hulan Saas M.D.   On: 01/18/2016 11:28   US Biopsy  02/12/2016  INDICATION: 81 year old male with acute renal failure. EXAM: ULTRASOUND GUIDED RENAL BIOPSY COMPARISON:  None. MEDICATIONS: Fentanyl 50 mcg IV; Versed 1 mg IV ANESTHESIA/SEDATION: Total Moderate Sedation time 5 minutes COMPLICATIONS: None immediate PROCEDURE: Informed written consent was obtained from the patient after a discussion of the risks, benefits and alternatives to treatment. The patient  understands and consents the procedure. A timeout was performed prior to the initiation of the procedure. Ultrasound scanning was performed of the bilateral flanks. The inferior pole of the left kidney was selected for biopsy due to location and sonographic window. The procedure was planned. The operative site was prepped and draped in the usual sterile fashion. The overlying soft tissues were anesthetized with 1% lidocaine with epinephrine. A 16 gauge core needle biopsy device was advanced into the inferior cortex of the left kidney and 2 core biopsies were obtained under direct ultrasound guidance. Images were saved for documentation purposes. The biopsy device was removed and hemostasis was obtained with manual compression. Post procedural scanning was negative for significant post procedural hemorrhage or additional complication. A dressing was placed. The patient tolerated the procedure well without immediate post procedural  complication. IMPRESSION: Technically successful ultrasound guided left renal biopsy. Electronically Signed   By: Malachy Moan M.D.   On: 02/12/2016 17:03   Dg Chest Port 1 View  02/05/2016  CLINICAL DATA:  Intubation EXAM: PORTABLE CHEST 1 VIEW COMPARISON:  Two days ago FINDINGS: Endotracheal tube tip is just below the clavicular heads. There is an orogastric tube with side port at the lower esophagus. Progressive opacification of the right middle and lower lobes with air bronchograms and volume loss. Streaky left basilar opacity. No edema, visible effusion, or air leak. IMPRESSION: 1. Endotracheal tube in good position. 2. Orogastric tube with side port at the lower esophagus. Suggest advancement for more secure positioning. 3. Progressive right middle and lower lobe pneumonia and/or atelectasis. Electronically Signed   By: Marnee Spring M.D.   On: 02/05/2016 02:57   Dg Chest Port 1 View  02/03/2016  CLINICAL DATA:  Shortness of breath. EXAM: PORTABLE CHEST 1 VIEW COMPARISON:   02/02/2016 . FINDINGS: Endotracheal tube and NG tube in stable position. Heart size normal. Persistent mild interstitial prominence with right lower lobe infiltrate noted. No pleural effusion noted on today's exam . Calcific debris noted in the stomach. IMPRESSION: 1. Lines and tubes in stable position. 2. Persistent mild bilateral interstitial prominence with new right lower lobe infiltrate . Electronically Signed   By: Maisie Fus  Register   On: 02/03/2016 07:18   Portable Chest Xray  02/01/2016  CLINICAL DATA:  Post intubation. EXAM: PORTABLE CHEST 1 VIEW COMPARISON:  01/31/2016 at 1940 hour FINDINGS: Endotracheal tube is 7.1 cm from the carina. Progressive bilateral interstitial opacities concerning for increased pulmonary edema. Bibasilar opacities may be confluent edema or infectious, right greater than left. Probable developing right pleural effusion. Cardiomediastinal contours are unchanged. Remote right rib fracture. IMPRESSION: 1. Endotracheal tube 7.1 cm from the carina. 2. Progressive pulmonary edema. 3. More confluent right greater than lung base opacities may be confluent vascular structures, pneumonia or aspiration. Probable developing right pleural effusion. Electronically Signed   By: Rubye Oaks M.D.   On: 02/01/2016 01:56   Dg Chest Port 1 View  01/31/2016  CLINICAL DATA:  80 y/o male with c/o SOB today, seemed to be in decent shape during exam. Hx a-fib, DM, HTN EXAM: PORTABLE CHEST 1 VIEW COMPARISON:  01/17/2016 FINDINGS: Heart size is obscured by dense opacity at the lung bases, right greater than left. Kerley B-lines are consistent with interstitial pulmonary edema. Suspect right pleural effusion. IMPRESSION: 1. Dense bilateral lower lobe infiltrates, favoring infectious process. 2. Mild interstitial pulmonary edema also noted. Electronically Signed   By: Norva Pavlov M.D.   On: 01/31/2016 19:58   Dg Chest Port 1v Same Day  02/08/2016  CLINICAL DATA:  SOB. H/o chronic a-fib,  diabetes, cardiac arrest, HTN. Surgical h/o cardiac cath. Former smoker. EXAM: PORTABLE CHEST 1 VIEW COMPARISON:  02/05/2016 FINDINGS: Heart size upper normal. Uncoiling of the aorta. Vascular pattern normal. Lungs are clear. 4 mm nodule right lung base stable from 01/16/2016 CT scan at which time it was demonstrated to represent small calcified granuloma. IMPRESSION: No active disease. Electronically Signed   By: Esperanza Heir M.D.   On: 02/08/2016 11:52    Microbiology: Recent Results (from the past 240 hour(s))  Urine culture     Status: Abnormal   Collection Time: 02/08/16  1:45 PM  Result Value Ref Range Status   Specimen Description URINE, CATHETERIZED  Final   Special Requests NONE  Final   Culture (A)  Final  1,000 COLONIES/mL INSIGNIFICANT GROWTH [facility]    Report Status 02/10/2016 FINAL  Final  Culture, blood (Routine X 2) w Reflex to ID Panel     Status: None   Collection Time: 02/08/16  7:15 PM  Result Value Ref Range Status   Specimen Description BLOOD RIGHT ARM  Final   Special Requests IN PEDIATRIC BOTTLE 2CC  Final   Culture   Final    NO GROWTH 5 DAYS [facility]formed at [facility]    Report Status 02/13/2016 FINAL  Final  Culture, blood (Routine X 2) w Reflex to ID Panel     Status: None   Collection Time: 02/08/16  7:20 PM  Result Value Ref Range Status   Specimen Description BLOOD LEFT ARM  Final   Special Requests BOTTLES DRAWN AEROBIC ONLY 8CC  Final   Culture   Final    NO GROWTH 5 DAYS [facility]    Report Status 02/13/2016 FINAL  Final     Labs: Basic Metabolic Panel:  Recent Labs Lab 02/09/16 0440  02/09/16 0444 02/10/16 0447 02/11/16 0502 02/12/16 0455 02/13/16 0455 02/14/16 0545 02/15/16 0526  NA  --   < > 146* 143 137 139 139 136 136  K  --   < > 3.8 3.8 3.7 4.2 4.3 4.6 4.4  CL  --   < > 110 108 104 106 108 107 106  CO2  --   < > 20* 21* 21* 22 21* 20* 21*  GLUCOSE  --   < > 147*  205* 233* 174* 197* 164* 112*  BUN  --   < > 84* 77* 68* 59* 60* 61* 67*  CREATININE  --   < > 4.64* 4.21* 3.71* 3.63* 3.54* 3.57* 3.68*  CALCIUM  --   < > 8.7* 8.7* 8.5* 8.7* 8.7* 9.0 8.9  MG 1.9  --   --  1.8  --   --   --   --   --   PHOS  --   --  4.0 4.8* 4.3 3.8 4.3  --   --   < > = values in this interval not displayed. Liver Function Tests:  Recent Labs Lab 02/09/16 0444 02/10/16 0447 02/11/16 0502 02/12/16 0455 02/13/16 0455  ALBUMIN 3.1* 3.1* 3.1* 3.3* 2.9*   No results for input(s): LIPASE, AMYLASE in the last 168 hours. No results for input(s): AMMONIA in the last 168 hours. CBC:  Recent Labs Lab 02/09/16 0440 02/10/16 0447 02/11/16 0502 02/12/16 0455 02/13/16 0455  WBC 7.6 6.8 6.8 6.7 7.3  HGB 8.4* 8.3* 9.0* 9.2* 8.6*  HCT 25.3* 24.9* 26.0* 26.9* 25.1*  MCV 88.5 88.9 87.8 87.9 87.8  PLT 285 287 294 294 288   Cardiac Enzymes: No results for input(s): CKTOTAL, CKMB, CKMBINDEX, TROPONINI in the last 168 hours. BNP: BNP (last 3 results)  Recent Labs  02/01/16 0332  BNP 792.4*    ProBNP (last 3 results) No results for input(s): PROBNP in the last 8760 hours.  CBG:  Recent Labs Lab 02/14/16 0750 02/14/16 1201 02/14/16 1711 02/14/16 2156 02/15/16 0739  GLUCAP 168* 266* 131* 256* 103*       SignedZannie Cove:  Isaly Fasching MD.  [facility] 02/15/2016, 11:34 AM

## 2016-02-15 NOTE — Progress Notes (Signed)
ANTICOAGULATION CONSULT NOTE - Follow Up Consult  Pharmacy Consult for Warfarin Indication: atrial fibrillation  Allergies  Allergen Reactions  . Nitroglycerin Anaphylaxis  . Penicillins Anaphylaxis, Hives and Shortness Of Breath    Has patient had a PCN reaction causing immediate rash, facial/tongue/throat swelling, SOB or lightheadedness with hypotension: Yes Has patient had a PCN reaction causing severe rash involving mucus membranes or skin necrosis: Yes Has patient had a PCN reaction that required hospitalization Yes Has patient had a PCN reaction occurring within the last 10 years: No If all of the above answers are "NO", then may proceed with Cephalosporin use.   . Sudafed  [Pseudoephedrine Hcl] Hives  . Tetracycline Shortness Of Breath    Other reaction(s): SHORTNESS OF BREATH  . Vancomycin Hives  . Doxycycline Rash    Other reaction(s): RASH  . Sulfa Antibiotics Rash    Patient Measurements: Height: 6\' 3"  (190.5 cm) Weight: 158 lb 15.2 oz (72.1 kg) IBW/kg (Calculated) : 84.5  Vital Signs: Temp: 97.7 F (36.5 C) (05/06 0425) Temp Source: Oral (05/06 0425) BP: 128/59 mmHg (05/06 0425) Pulse Rate: 57 (05/06 0425)  Labs:  Recent Labs  02/13/16 0455 02/14/16 0545 02/15/16 0526  HGB 8.6*  --   --   HCT 25.1*  --   --   PLT 288  --   --   LABPROT 15.2 16.1* 16.6*  INR 1.23 1.33 1.38  CREATININE 3.54* 3.57* 3.68*    Estimated Creatinine Clearance: 16.9 mL/min (by C-G formula based on Cr of 3.68).  Assessment: Patient is a 81 yo M with hx colon cancer and atrial fibrillation on warfarin PTA who is currently admitted for acute renal failure, CAP, and UTI. Pharmacy consulted to assist with dosing warfarin. Lovenox bridging dc'd 4/23 stopped as INR became therapeutic.  CHA2DS2-VASc of at least 4 for DM, HTN and 2 points for age.  Home dose is 5mg  T/R/Sat and 2.5mg  all other days  Today, 02/15/2016 - INR sub-therapeutic after holding doses for renal bx.   Warfarin resumed 5/4 and INR slow to respond likely due to dose of 5 mg IV Vitamin K given on 5/2 which will likely effect INR for a few days. - Renal biopsy completed 5/3.  Showed severe ATN.  SCr slow to recover. - CBC: Hgb low but stable, pltc WNL 5/4 - No bleeding documented - Diet: Carb mod - eating - Drug interactions:   - amiodarone drip from 4/24 to 4/26   Goal of Therapy:  INR 2-3   Plan:  Coumadin 7.5 mg po x1 tonight.  Will provide another larger dose tonight since patient slow to respond due to Vitamin K. Daily INR  Clance BollAmanda Jquan Egelston, PharmD, BCPS Pager: [phone] 02/15/2016 9:23 AM

## 2016-02-21 ENCOUNTER — Encounter (HOSPITAL_COMMUNITY): Payer: Self-pay

## 2016-03-06 ENCOUNTER — Emergency Department (HOSPITAL_COMMUNITY)
Admission: EM | Admit: 2016-03-06 | Discharge: 2016-03-07 | Disposition: A | Discharge status: Home / Self Care | Payer: Medicare Other | Attending: Emergency Medicine | Admitting: Emergency Medicine

## 2016-03-06 ENCOUNTER — Emergency Department (HOSPITAL_COMMUNITY): Payer: Medicare Other

## 2016-03-06 ENCOUNTER — Encounter (HOSPITAL_COMMUNITY): Payer: Self-pay | Admitting: Oncology

## 2016-03-06 DIAGNOSIS — Z87891 Personal history of nicotine dependence: Secondary | ICD-10-CM | POA: Insufficient documentation

## 2016-03-06 DIAGNOSIS — E119 Type 2 diabetes mellitus without complications: Secondary | ICD-10-CM | POA: Diagnosis not present

## 2016-03-06 DIAGNOSIS — N189 Chronic kidney disease, unspecified: Secondary | ICD-10-CM | POA: Diagnosis not present

## 2016-03-06 DIAGNOSIS — R531 Weakness: Secondary | ICD-10-CM | POA: Diagnosis not present

## 2016-03-06 DIAGNOSIS — R911 Solitary pulmonary nodule: Secondary | ICD-10-CM

## 2016-03-06 DIAGNOSIS — Z7901 Long term (current) use of anticoagulants: Secondary | ICD-10-CM | POA: Insufficient documentation

## 2016-03-06 DIAGNOSIS — I129 Hypertensive chronic kidney disease with stage 1 through stage 4 chronic kidney disease, or unspecified chronic kidney disease: Secondary | ICD-10-CM | POA: Diagnosis not present

## 2016-03-06 DIAGNOSIS — Z794 Long term (current) use of insulin: Secondary | ICD-10-CM | POA: Diagnosis not present

## 2016-03-06 DIAGNOSIS — N289 Disorder of kidney and ureter, unspecified: Secondary | ICD-10-CM

## 2016-03-06 DIAGNOSIS — Z85038 Personal history of other malignant neoplasm of large intestine: Secondary | ICD-10-CM | POA: Diagnosis not present

## 2016-03-06 DIAGNOSIS — N390 Urinary tract infection, site not specified: Secondary | ICD-10-CM | POA: Diagnosis present

## 2016-03-06 MED ORDER — SODIUM CHLORIDE 0.9 % IV SOLN
1000.0000 mL | Freq: Once | INTRAVENOUS | Status: AC
Start: 2016-03-06 — End: 2016-03-07
  Administered 2016-03-07: 1000 mL via INTRAVENOUS

## 2016-03-06 MED ORDER — SODIUM CHLORIDE 0.9 % IV SOLN: 1000.0000 mL | INTRAVENOUS | Status: DC

## 2016-03-06 NOTE — ED Provider Notes (Signed)
CSN: [id number]     Arrival date & time 03/06/16  2159 History  By signing my name below, I, Paul Guerra, attest that this documentation has been prepared under the direction and in the presence of Paul Booze, MD.  Electronically Signed: Hollace Guerra, ED Scribe. 03/06/2016. 12:08 AM.  Chief Complaint  Patient presents with  . Urinary Tract Infection   The history is provided by the patient and a relative. No language interpreter was used.   HPI Comments: Paul Guerra is a 81 y.o. male with a PMHx of recurrent nephrolithiasis with stent placement, DM, BPH, HTN, HCC, and Afib who presents to the Emergency Department complaining of constant, worsening weakness that began earlier today. Pt reports diaphoresis, unexpected weight loss, lower back pain, frequency, urgency, and dysuria. Pt left [facility] this morning and was diagnosed with a UTI. At that time blood work performed which confirmed that kidney function was abnormal. Pt was then advised to come to the ED for further evaluation. He is currently taking coumadin for hx of AFIB. No recent fever, cough, chills, nausea, and vomiting.    PCP: No primary care provider on file.   Past Medical History  Diagnosis Date  . Recurrent nephrolithiasis 01/17/2016  . Chronic a-fib (HCC) 01/17/2016  . DM (diabetes mellitus) (HCC) 01/17/2016  . BPH (benign prostatic hyperplasia) 01/17/2016  . History of colon cancer 01/17/2016  . Constipation 01/17/2016  . HTN (hypertension), benign 01/17/2016  . Chronic pancreatitis (HCC) 01/17/2016  . Hyperlipidemia 01/17/2016  . Cardiac arrest (HCC) 01/17/2016  . Chewing tobacco use 01/17/2016   Past Surgical History  Procedure Laterality Date  . Cholecystectomy    . Colon surgery      right hemicolectomy  . Cardiac catheterization      cardiac stents  . Transurethral resection of prostate    . Hernia repair Right 09/03/2014    incisional hernia repair/impantation of mesh/ventral hernia repair  . Lithotripsy    . Eye  surgery      bilateral cataracts  . Cystoscopy w/ ureteral stent placement Right 01/20/2016    Procedure: CYSTOSCOPY WITH RETROGRADE PYELOGRAM/URETERAL STENT PLACEMENT;  Surgeon: Marcine Matar, MD;  Location: [facility] ORS;  Service: Urology;  Laterality: Right;   Family History  Problem Relation Age of Onset  . Leukemia Father    Social History  Substance Use Topics  . Smoking status: Former Smoker    Types: Cigarettes    Quit date: 08/22/1978  . Smokeless tobacco: Current User    Types: Chew  . Alcohol Use: No    Review of Systems  Constitutional: Positive for diaphoresis and unexpected weight change (weight loss). Negative for fever and chills.  Respiratory: Negative for cough.   Gastrointestinal: Negative for nausea and vomiting.  Genitourinary: Positive for dysuria, urgency and frequency (at night).  Musculoskeletal: Positive for back pain (lower).  Skin: Positive for pallor.  Neurological: Positive for weakness.  All other systems reviewed and are negative.  Allergies  Nitroglycerin; Penicillins; Sudafed ; Tetracycline; Vancomycin; Doxycycline; and Sulfa antibiotics  Home Medications   Prior to Admission medications   Medication Sig Start Date End Date Taking? Authorizing Provider  amitriptyline (ELAVIL) 25 MG tablet Take 25 mg by mouth at bedtime.   Yes Historical Provider, MD  amLODipine (NORVASC) 5 MG tablet Take 1 tablet (5 mg total) by mouth daily. 02/15/16  Yes Zannie Cove, MD  atorvastatin (LIPITOR) 20 MG tablet Take 20 mg by mouth daily.   Yes Historical Provider, MD  dutasteride (AVODART) 0.5 MG capsule Take 0.5 mg by mouth daily.   Yes Historical Provider, MD  insulin aspart (NOVOLOG) 100 UNIT/ML injection Inject 0-15 Units into the skin 4 (four) times daily -  before meals and at bedtime. Patient taking differently: Inject 0-15 Units into the skin 4 (four) times daily -  before meals and at bedtime. Sliding scale. 02/15/16  Yes Zannie CovePreetha Joseph, MD  insulin  glargine (LANTUS) 100 UNIT/ML injection Inject 0.1 mLs (10 Units total) into the skin daily. 02/15/16  Yes Zannie CovePreetha Joseph, MD  lisinopril (PRINIVIL,ZESTRIL) 20 MG tablet Take 20 mg by mouth daily.  02/12/16  Yes Historical Provider, MD  metoprolol (LOPRESSOR) 25 MG tablet Take 1 tablet (25 mg total) by mouth 2 (two) times daily. 02/15/16  Yes Zannie CovePreetha Joseph, MD  mineral oil liquid Take 30 mLs by mouth daily as needed for moderate constipation.   Yes Historical Provider, MD  Multiple Vitamin (MULTIVITAMIN WITH MINERALS) TABS tablet Take 1 tablet by mouth daily.   Yes Historical Provider, MD  triamcinolone cream (KENALOG) 0.1 % Apply 1 application topically 2 (two) times daily as needed (rash).    Yes Historical Provider, MD  warfarin (COUMADIN) 5 MG tablet Take 1.5 tablets (7.5 mg total) by mouth as directed. TAKE 7.5MG  DAILY TILL INR >2, THEN TITRATE down to keep INR>2 02/15/16  Yes Zannie CovePreetha Joseph, MD   BP 175/83 mmHg  Pulse 75  Temp(Src) 98.3 F (36.8 C) (Oral)  Resp 18  SpO2 100%   Physical Exam  Constitutional: He is oriented to person, place, and time. He appears well-developed and well-nourished.  HENT:  Head: Normocephalic and atraumatic.  Eyes: Pupils are equal, round, and reactive to light.  Neck: Normal range of motion. Neck supple. No JVD present.  Cardiovascular: Normal rate, regular rhythm and normal heart sounds.   No murmur heard. Pulmonary/Chest: Effort normal and breath sounds normal. He has no wheezes. He has no rales. He exhibits no tenderness.  Abdominal: Soft. Bowel sounds are normal. He exhibits no distension and no mass. There is no tenderness.  Musculoskeletal: Normal range of motion. He exhibits no edema.  Lymphadenopathy:    He has no cervical adenopathy.  Neurological: He is alert and oriented to person, place, and time. No cranial nerve deficit. He exhibits normal muscle tone. Coordination normal.  Skin: Skin is warm and dry. No rash noted. There is pallor.   Psychiatric: He has a normal mood and affect. His behavior is normal. Judgment and thought content normal.  Nursing note and vitals reviewed.   ED Course  Procedures (including critical care time)  DIAGNOSTIC STUDIES: Oxygen Saturation is 100% on RA, normal by my interpretation.   COORDINATION OF CARE: 11:50 AM-Discussed next steps with pt including IV Fluids, CBC, and a CXR. Pt verbalized understanding and is agreeable with the plan.   Labs Review Results for orders placed or performed during the hospital encounter of 03/06/16  Comprehensive metabolic panel  Result Value Ref Range   Sodium 135 135 - 145 mmol/L   Potassium 4.5 3.5 - 5.1 mmol/L   Chloride 108 101 - 111 mmol/L   CO2 17 (L) 22 - 32 mmol/L   Glucose, Bld 330 (H) 65 - 99 mg/dL   BUN 65 (H) 6 - 20 mg/dL   Creatinine, Ser 9.143.62 (H) 0.61 - 1.24 mg/dL   Calcium 8.6 (L) 8.9 - 10.3 mg/dL   Total Protein 7.3 6.5 - 8.1 g/dL   Albumin 3.7 3.5 - 5.0 g/dL  AST 19 15 - 41 U/L   ALT 26 17 - 63 U/L   Alkaline Phosphatase 109 38 - 126 U/L   Total Bilirubin 0.7 0.3 - 1.2 mg/dL   GFR calc non Af Amer 15 (L) >60 mL/min   GFR calc Af Amer 17 (L) >60 mL/min   Anion gap 10 5 - 15  CBC with Differential  Result Value Ref Range   WBC 5.6 4.0 - 10.5 K/uL   RBC 2.52 (L) 4.22 - 5.81 MIL/uL   Hemoglobin 7.8 (L) 13.0 - 17.0 g/dL   HCT 04.5 (L) 40.9 - 81.1 %   MCV 88.1 78.0 - 100.0 fL   MCH 31.0 26.0 - 34.0 pg   MCHC 35.1 30.0 - 36.0 g/dL   RDW 91.4 78.2 - 95.6 %   Platelets 216 150 - 400 K/uL   Neutrophils Relative % 64 %   Neutro Abs 3.6 1.7 - 7.7 K/uL   Lymphocytes Relative 24 %   Lymphs Abs 1.4 0.7 - 4.0 K/uL   Monocytes Relative 9 %   Monocytes Absolute 0.5 0.1 - 1.0 K/uL   Eosinophils Relative 3 %   Eosinophils Absolute 0.2 0.0 - 0.7 K/uL   Basophils Relative 0 %   Basophils Absolute 0.0 0.0 - 0.1 K/uL  Urinalysis, Routine w reflex microscopic  Result Value Ref Range   Color, Urine YELLOW YELLOW   APPearance CLOUDY  (A) CLEAR   Specific Gravity, Urine 1.014 1.005 - 1.030   pH 5.0 5.0 - 8.0   Glucose, UA >1000 (A) NEGATIVE mg/dL   Hgb urine dipstick MODERATE (A) NEGATIVE   Bilirubin Urine NEGATIVE NEGATIVE   Ketones, ur NEGATIVE NEGATIVE mg/dL   Protein, ur NEGATIVE NEGATIVE mg/dL   Nitrite NEGATIVE NEGATIVE   Leukocytes, UA MODERATE (A) NEGATIVE  CK  Result Value Ref Range   Total CK 44 (L) 49 - 397 U/L  Urine microscopic-add on  Result Value Ref Range   Squamous Epithelial / LPF 0-5 (A) NONE SEEN   WBC, UA TOO NUMEROUS TO COUNT 0 - 5 WBC/hpf   RBC / HPF 0-5 0 - 5 RBC/hpf   Bacteria, UA RARE (A) NONE SEEN   Urine-Other YEAST PRESENT    Imaging Review Dg Chest 2 View  03/07/2016  CLINICAL DATA:  Weakness.  Urinary tract infection. EXAM: CHEST  2 VIEW COMPARISON:  Radiographs 02/08/2016. Included lung bases from abdominal CT 01/16/2016 FINDINGS: Patchy opacity in the right middle lobe. This is increased from prior exam. The left lung is clear. Heart size is normal, mild tortuosity and atherosclerosis of the thoracic aorta. No pulmonary edema. No pleural effusion or pneumothorax. Probable remote right rib fractures. IMPRESSION: Focal right middle lobe opacity. Recurrent pneumonia versus pulmonary mass. Recommend chest CT. Electronically Signed   By: Rubye Oaks M.D.   On: 03/07/2016 00:58   Ct Chest Wo Contrast  03/07/2016  CLINICAL DATA:  Acute onset of generalized weakness, diaphoresis, unexpected weight loss and lower back pain. Dysuria. Initial encounter. EXAM: CT CHEST WITHOUT CONTRAST TECHNIQUE: Multidetector CT imaging of the chest was performed following the standard protocol without IV contrast. COMPARISON:  Chest radiograph performed earlier today at 12:25 a.m. FINDINGS: There is persistent opacification along the posterior aspect of the right middle lobe, with prominent spiculation tracking along the right major fissure. This measures approximately 3.5 cm, not including spiculations. Given  persistence from April, malignancy cannot be excluded. Chronic infection could have a similar appearance. There is a spiculated nodular density  measuring 1.5 cm near the right lung apex, with an additional smaller adjacent nodule. Minimal hazy opacity is seen within the right lung, which may reflect acute infection. Scattered blebs are seen at the lung apices. The left lung appears relatively clear. No pleural effusion or pneumothorax is seen. Diffuse coronary artery calcification is seen. Scattered calcification is seen along the aortic arch and descending thoracic aorta, and along the proximal great vessels. No pericardial effusion is identified. No mediastinal lymphadenopathy is seen. The thyroid gland is unremarkable. No axillary lymphadenopathy is seen. The visualized portions of the liver and spleen are grossly unremarkable. The patient is status post cholecystectomy, with clips noted at the gallbladder fossa. Diffuse calcification is seen along the pancreas, compatible with sequelae of chronic pancreatitis. The visualized portions of the adrenal glands and kidneys are grossly unremarkable. Mild right-sided hydronephrosis is again noted. IMPRESSION: 1. Persistent opacification along the posterior aspect of the right middle lung lobe, with prominent spiculation tracking along the right major fissure. This measures approximately 3.5 cm, not including spiculation. Given persistence from April, malignancy cannot be excluded. Chronic infection could have a similar appearance. PET/CT or biopsy would be helpful for further evaluation. 2. Spiculated nodular densities at the right lung apex, measuring up to 1.5 cm, which could reflect metastatic disease. 3. Minimal hazy opacity within the right lung may reflect mild acute infection. 4. Scattered blebs at the lung apices. 5. Diffuse coronary artery calcifications seen. 6. Diffuse calcification along the pancreas, compatible sequelae of chronic pancreatitis. 7. Mild  chronic right-sided hydronephrosis again noted. Electronically Signed   By: Roanna Raider M.D.   On: 03/07/2016 02:05   I have personally reviewed and evaluated these images and lab results as part of my medical decision-making.   MDM   Final diagnoses:  Weakness  Lung nodule  Renal insufficiency   Patient with very complicated history but recent hospitalization for acute renal failure. He apparently was seen by his physician at [facility] and there was concern for worsening renal failure. I reviewed the records from here and from [facility] and actually his creatinine is stable. Laboratory workup was initiated and it is confirmed that the BUN and creatinine are essentially at baseline. Hemoglobin is trending lower but still at a reasonable level. This is likely due to new renal failure. Urinalysis showed many WBCs but only rare bacteria and use were present. With a yeast present, it was felt to be unproductive to initiate antibiotics. Urine was sent for culture but will likely not be helpful as yeast 10 to overgrow and obscure any bacterial growth. Chest x-ray showed persistent shadow is worrisome for either persistent pneumonia or malignancy. CT was recommended. Patient family were concerned that this could not be obtained easily as an outpatient. CT was obtained in the ED showing to spiculated areas which are worrisome for malignancy and biopsy or PET/CT were recommended. I discussed this finding with patient and family. He is referred to pulmonology for further outpatient workup. He is already under the care of a nephrologist and they will will need to follow him for his renal insufficiency.   I personally performed the services described in this documentation, which was scribed in my presence. The recorded information has been reviewed and is accurate.        Paul Booze, MD 03/07/16 [phone]

## 2016-03-06 NOTE — ED Notes (Signed)
Pt presents for burning and frequency w/ urination.  Pt's family member is concerned that his, "kidneys will collapse."

## 2016-03-07 ENCOUNTER — Emergency Department (HOSPITAL_COMMUNITY): Payer: Medicare Other

## 2016-03-07 LAB — CBC WITH DIFFERENTIAL/PLATELET
BASOS ABS: 0 10*3/uL (ref 0.0–0.1)
BASOS PCT: 0 %
EOS PCT: 3 %
Eosinophils Absolute: 0.2 10*3/uL (ref 0.0–0.7)
HCT: 22.2 % — ABNORMAL LOW (ref 39.0–52.0)
Hemoglobin: 7.8 g/dL — ABNORMAL LOW (ref 13.0–17.0)
Lymphocytes Relative: 24 %
Lymphs Abs: 1.4 10*3/uL (ref 0.7–4.0)
MCH: 31 pg (ref 26.0–34.0)
MCHC: 35.1 g/dL (ref 30.0–36.0)
MCV: 88.1 fL (ref 78.0–100.0)
MONO ABS: 0.5 10*3/uL (ref 0.1–1.0)
Monocytes Relative: 9 %
Neutro Abs: 3.6 10*3/uL (ref 1.7–7.7)
Neutrophils Relative %: 64 %
Platelets: 216 10*3/uL (ref 150–400)
RBC: 2.52 MIL/uL — AB (ref 4.22–5.81)
RDW: 15.1 % (ref 11.5–15.5)
WBC: 5.6 10*3/uL (ref 4.0–10.5)

## 2016-03-07 LAB — COMPREHENSIVE METABOLIC PANEL
ALBUMIN: 3.7 g/dL (ref 3.5–5.0)
ALK PHOS: 109 U/L (ref 38–126)
ALT: 26 U/L (ref 17–63)
ANION GAP: 10 (ref 5–15)
AST: 19 U/L (ref 15–41)
BILIRUBIN TOTAL: 0.7 mg/dL (ref 0.3–1.2)
BUN: 65 mg/dL — AB (ref 6–20)
CALCIUM: 8.6 mg/dL — AB (ref 8.9–10.3)
CO2: 17 mmol/L — ABNORMAL LOW (ref 22–32)
Chloride: 108 mmol/L (ref 101–111)
Creatinine, Ser: 3.62 mg/dL — ABNORMAL HIGH (ref 0.61–1.24)
GFR calc Af Amer: 17 mL/min — ABNORMAL LOW (ref >60)
GFR, EST NON AFRICAN AMERICAN: 15 mL/min — AB (ref >60)
GLUCOSE: 330 mg/dL — AB (ref 65–99)
POTASSIUM: 4.5 mmol/L (ref 3.5–5.1)
Sodium: 135 mmol/L (ref 135–145)
TOTAL PROTEIN: 7.3 g/dL (ref 6.5–8.1)

## 2016-03-07 LAB — URINE MICROSCOPIC-ADD ON

## 2016-03-07 LAB — URINALYSIS, ROUTINE W REFLEX MICROSCOPIC
Bilirubin Urine: NEGATIVE
KETONES UR: NEGATIVE mg/dL
NITRITE: NEGATIVE
PROTEIN: NEGATIVE mg/dL
Specific Gravity, Urine: 1.014 (ref 1.005–1.030)
pH: 5 (ref 5.0–8.0)

## 2016-03-07 LAB — CK: CK TOTAL: 44 U/L — AB (ref 49–397)

## 2016-03-07 NOTE — ED Notes (Addendum)
Patient transported to x-ray. ?

## 2016-03-07 NOTE — ED Notes (Signed)
Patient dressed out for Xray

## 2016-03-07 NOTE — ED Notes (Signed)
MD at bedside. 

## 2016-03-07 NOTE — ED Notes (Signed)
Patient d/c'd self care with family member.  F/U reviewed.  Patient and family member verbalized understanding. 

## 2016-03-07 NOTE — Discharge Instructions (Signed)
Your CT scan showed 2 nodules on the right lung. These may be scarring from prior infection, but may represent cancer. Please make an appointment with the pulmonologist to arrange further outpatient workup.  Pulmonary Nodule A pulmonary nodule is a small, round growth of tissue in the lung. Pulmonary nodules can range in size from less than 1/5 inch (4 mm) to a little bigger than an inch (25 mm). Most pulmonary nodules are detected when imaging tests of the lung are being performed for a different problem. Pulmonary nodules are usually not cancerous (benign). However, some pulmonary nodules are cancerous (malignant). Follow-up treatment or testing is based on the size of the pulmonary nodule and your risk of getting lung cancer.  CAUSES Benign pulmonary nodules can be caused by various things. Some of the causes include:   Bacterial, fungal, or viral infections. This is usually an old infection that is no longer active, but it can sometimes be a current, active infection.  A benign mass of tissue.  Inflammation from conditions such as rheumatoid arthritis.   Abnormal blood vessels in the lungs. Malignant pulmonary nodules can result from lung cancer or from cancers that spread to the lung from other places in the body. SIGNS AND SYMPTOMS Pulmonary nodules usually do not cause symptoms. DIAGNOSIS Most often, pulmonary nodules are found incidentally when an X-ray or CT scan is performed to look for some other problem in the lung area. To help determine whether a pulmonary nodule is benign or malignant, your health care provider will take a medical history and order a variety of tests. Tests done may include:   Blood tests.  A skin test called a tuberculin test. This test is used to determine if you have been exposed to the germ that causes tuberculosis.   Chest X-rays. If possible, a new X-ray may be compared with X-rays you have had in the past.   CT scan. This test shows smaller  pulmonary nodules more clearly than an X-ray.   Positron emission tomography (PET) scan. In this test, a safe amount of a radioactive substance is injected into the bloodstream. Then, the scan takes a picture of the pulmonary nodule. The radioactive substance is eliminated from your body in your urine.   Biopsy. A tiny piece of the pulmonary nodule is removed so it can be checked under a microscope. TREATMENT  Pulmonary nodules that are benign normally do not require any treatment because they usually do not cause symptoms or breathing problems. Your health care provider may want to monitor the pulmonary nodule through follow-up CT scans. The frequency of these CT scans will vary based on the size of the nodule and the risk factors for lung cancer. For example, CT scans will need to be done more frequently if the pulmonary nodule is larger and if you have a history of smoking and a family history of cancer. Further testing or biopsies may be done if any follow-up CT scan shows that the size of the pulmonary nodule has increased. HOME CARE INSTRUCTIONS  Only take over-the-counter or prescription medicines as directed by your health care provider.  Keep all follow-up appointments with your health care provider. SEEK MEDICAL CARE IF:  You have trouble breathing when you are active.   You feel sick or unusually tired.   You do not feel like eating.   You lose weight without trying to.   You develop chills or night sweats.  SEEK IMMEDIATE MEDICAL CARE IF:  You cannot catch your  breath, or you begin wheezing.   You cannot stop coughing.   You cough up blood.   You become dizzy or feel like you are going to pass out.   You have sudden chest pain.   You have a fever or persistent symptoms for more than 2-3 days.   You have a fever and your symptoms suddenly get worse. MAKE SURE YOU:  Understand these instructions.  Will watch your condition.  Will get help right away  if you are not doing well or get worse.   This information is not intended to replace advice given to you by your health care provider. Make sure you discuss any questions you have with your health care provider.   Document Released: 07/26/2009 Document Revised: 05/31/2013 Document Reviewed: 03/20/2013 Elsevier Interactive Patient Education 2016 Elsevier Inc.  Weakness Weakness is a lack of strength. It may be felt all over the body (generalized) or in one specific part of the body (focal). Some causes of weakness can be serious. You may need further medical evaluation, especially if you are elderly or you have a history of immunosuppression (such as chemotherapy or HIV), kidney disease, heart disease, or diabetes. CAUSES  Weakness can be caused by many different things, including:  Infection.  Physical exhaustion.  Internal bleeding or other blood loss that results in a lack of red blood cells (anemia).  Dehydration. This cause is more common in elderly people.  Side effects or electrolyte abnormalities from medicines, such as pain medicines or sedatives.  Emotional distress, anxiety, or depression.  Circulation problems, especially severe peripheral arterial disease.  Heart disease, such as rapid atrial fibrillation, bradycardia, or heart failure.  Nervous system disorders, such as Guillain-Barr syndrome, multiple sclerosis, or stroke. DIAGNOSIS  To find the cause of your weakness, your caregiver will take your history and perform a physical exam. Lab tests or X-rays may also be ordered, if needed. TREATMENT  Treatment of weakness depends on the cause of your symptoms and can vary greatly. HOME CARE INSTRUCTIONS   Rest as needed.  Eat a well-balanced diet.  Try to get some exercise every day.  Only take over-the-counter or prescription medicines as directed by your caregiver. SEEK MEDICAL CARE IF:   Your weakness seems to be getting worse or spreads to other parts of  your body.  You develop new aches or pains. SEEK IMMEDIATE MEDICAL CARE IF:   You cannot perform your normal daily activities, such as getting dressed and feeding yourself.  You cannot walk up and down stairs, or you feel exhausted when you do so.  You have shortness of breath or chest pain.  You have difficulty moving parts of your body.  You have weakness in only one area of the body or on only one side of the body.  You have a fever.  You have trouble speaking or swallowing.  You cannot control your bladder or bowel movements.  You have black or bloody vomit or stools. MAKE SURE YOU:  Understand these instructions.  Will watch your condition.  Will get help right away if you are not doing well or get worse.   This information is not intended to replace advice given to you by your health care provider. Make sure you discuss any questions you have with your health care provider.   Document Released: 09/28/2005 Document Revised: 03/29/2012 Document Reviewed: 11/27/2011 Elsevier Interactive Patient Education [employer]2016 Elsevier Inc.

## 2016-03-08 LAB — URINE CULTURE: Culture: <10000 — AB

## 2016-03-10 ENCOUNTER — Telehealth: Payer: Self-pay

## 2016-03-10 NOTE — Telephone Encounter (Signed)
Offered her the 6/9 appoint she wanting something sooner, also explained that this was the 7-14 days for just getting out of hosp.Caren GriffinsStanley A Dalton

## 2016-03-10 NOTE — Telephone Encounter (Signed)
Patient's daughter calling to get patient in this week to be seen by our Pulmonary doctors.  Patient was seen at the hospital by Dr. Preston FleetingGlick and Dr. Preston FleetingGlick advised daughter that she needed to have patient seen ASAP.  Lung nodule found on CT and patient is struggling with Pneumonia.  Patient scheduled to see Dr. Kriste BasqueNadel on 03/12/16 for Consult at 10am.  Patient's daughter aware of appointment.  All records are in Epic. Nothing further needed.

## 2016-03-11 ENCOUNTER — Inpatient Hospital Stay: Payer: Medicare Other | Admitting: Acute Care

## 2016-03-12 ENCOUNTER — Ambulatory Visit (INDEPENDENT_AMBULATORY_CARE_PROVIDER_SITE_OTHER): Payer: Medicare Other | Admitting: Pulmonary Disease

## 2016-03-12 ENCOUNTER — Encounter: Payer: Self-pay | Admitting: Pulmonary Disease

## 2016-03-12 ENCOUNTER — Telehealth: Payer: Self-pay | Admitting: Pulmonary Disease

## 2016-03-12 ENCOUNTER — Telehealth: Payer: Self-pay | Admitting: General Practice

## 2016-03-12 ENCOUNTER — Other Ambulatory Visit (INDEPENDENT_AMBULATORY_CARE_PROVIDER_SITE_OTHER): Payer: Medicare Other

## 2016-03-12 VITALS — BP 122/60 | HR 68 | Temp 97.8°F | Ht 75.0 in | Wt 165.2 lb

## 2016-03-12 DIAGNOSIS — N184 Chronic kidney disease, stage 4 (severe): Secondary | ICD-10-CM

## 2016-03-12 DIAGNOSIS — R5381 Other malaise: Secondary | ICD-10-CM

## 2016-03-12 DIAGNOSIS — I482 Chronic atrial fibrillation, unspecified: Secondary | ICD-10-CM

## 2016-03-12 DIAGNOSIS — D631 Anemia in chronic kidney disease: Secondary | ICD-10-CM | POA: Diagnosis not present

## 2016-03-12 DIAGNOSIS — I10 Essential (primary) hypertension: Secondary | ICD-10-CM

## 2016-03-12 DIAGNOSIS — R938 Abnormal findings on diagnostic imaging of other specified body structures: Secondary | ICD-10-CM | POA: Diagnosis not present

## 2016-03-12 DIAGNOSIS — Z8709 Personal history of other diseases of the respiratory system: Secondary | ICD-10-CM | POA: Insufficient documentation

## 2016-03-12 DIAGNOSIS — J151 Pneumonia due to Pseudomonas: Secondary | ICD-10-CM

## 2016-03-12 DIAGNOSIS — R0600 Dyspnea, unspecified: Secondary | ICD-10-CM

## 2016-03-12 DIAGNOSIS — J9601 Acute respiratory failure with hypoxia: Secondary | ICD-10-CM

## 2016-03-12 DIAGNOSIS — R531 Weakness: Secondary | ICD-10-CM

## 2016-03-12 DIAGNOSIS — Z794 Long term (current) use of insulin: Secondary | ICD-10-CM

## 2016-03-12 DIAGNOSIS — E0821 Diabetes mellitus due to underlying condition with diabetic nephropathy: Secondary | ICD-10-CM

## 2016-03-12 DIAGNOSIS — R9389 Abnormal findings on diagnostic imaging of other specified body structures: Secondary | ICD-10-CM | POA: Insufficient documentation

## 2016-03-12 LAB — BASIC METABOLIC PANEL
BUN: 67 mg/dL — ABNORMAL HIGH (ref 6–23)
CHLORIDE: 108 meq/L (ref 96–112)
CO2: 17 mEq/L — ABNORMAL LOW (ref 19–32)
CREATININE: 3.82 mg/dL — AB (ref 0.40–1.50)
Calcium: 8.7 mg/dL (ref 8.4–10.5)
GFR: 16.32 mL/min — AB (ref >60.00)
Glucose, Bld: 303 mg/dL — ABNORMAL HIGH (ref 70–99)
Potassium: 4.3 mEq/L (ref 3.5–5.1)
Sodium: 138 mEq/L (ref 135–145)

## 2016-03-12 LAB — VITAMIN B12: VITAMIN B 12: 676 pg/mL (ref 211–911)

## 2016-03-12 LAB — HEMOGLOBIN A1C: Hgb A1c MFr Bld: 8.7 % — ABNORMAL HIGH (ref 4.6–6.5)

## 2016-03-12 LAB — CBC WITH DIFFERENTIAL/PLATELET
BASOS ABS: 0 10*3/uL (ref 0.0–0.1)
Basophils Relative: 0.4 % (ref 0.0–3.0)
EOS ABS: 0.1 10*3/uL (ref 0.0–0.7)
Eosinophils Relative: 2.1 % (ref 0.0–5.0)
HCT: 24.4 % — ABNORMAL LOW (ref 39.0–52.0)
Hemoglobin: 8.4 g/dL — ABNORMAL LOW (ref 13.0–17.0)
LYMPHS ABS: 1.5 10*3/uL (ref 0.7–4.0)
Lymphocytes Relative: 23.4 % (ref 12.0–46.0)
MCHC: 34.3 g/dL (ref 30.0–36.0)
MCV: 88.4 fl (ref 78.0–100.0)
Monocytes Absolute: 0.4 10*3/uL (ref 0.1–1.0)
Monocytes Relative: 5.7 % (ref 3.0–12.0)
NEUTROS ABS: 4.3 10*3/uL (ref 1.4–7.7)
NEUTROS PCT: 68.4 % (ref 43.0–77.0)
PLATELETS: 277 10*3/uL (ref 150.0–400.0)
RBC: 2.76 Mil/uL — ABNORMAL LOW (ref 4.22–5.81)
RDW: 15.9 % — AB (ref 11.5–15.5)
WBC: 6.3 10*3/uL (ref 4.0–10.5)

## 2016-03-12 LAB — FERRITIN: Ferritin: 464.5 ng/mL — ABNORMAL HIGH (ref 22.0–322.0)

## 2016-03-12 LAB — IBC PANEL
IRON: 102 ug/dL (ref 42–165)
Saturation Ratios: 32.1 % (ref 20.0–50.0)
TRANSFERRIN: 227 mg/dL (ref 212.0–360.0)

## 2016-03-12 LAB — PROTIME-INR
INR: 4 ratio — ABNORMAL HIGH (ref 0.8–1.0)
PROTHROMBIN TIME: 43.5 s — AB (ref 9.6–13.1)

## 2016-03-12 LAB — SEDIMENTATION RATE: Sed Rate: 28 mm/hr — ABNORMAL HIGH (ref 0–20)

## 2016-03-12 NOTE — Telephone Encounter (Signed)
Attempted to contact pt's daughter. No answer, no option to leave a message. Will try back.  

## 2016-03-12 NOTE — Telephone Encounter (Signed)
Dr Kriste BasqueNadel is requesting that you accept this patient under your care. Is this something that you are able to do at this point?

## 2016-03-12 NOTE — Telephone Encounter (Signed)
Very sorry, I am unable to accept at this time; ? Perhaps Dr Posey ReaPlotnikov or Dr Yetta BarreJones?

## 2016-03-12 NOTE — Progress Notes (Signed)
Subjective:     Patient ID: Paul Guerra, male   DOB: October 31, 1934, 81 y.o.   MRN: [id number]  HPI 81 y/o WM, referred for a pulmonary evaluation by Brynda Peon due to an abnormal CT Chest...  Pertinent medical history is difficult to piece together as he has had the majority of his prev medical care in [facility], [place]/ [place], [facility]> but I have reviewed extensive Care Everywhere records plus our Epic chart>>  He was transferred from [facility] to [place] & hospitalized here 4/7 -02/15/16 by [facility] w/ Nephrology, Urology, & CCM consulting (fluid overload, RLL pseudomonas pneumonia, and acute hypoxemic resp failure requiring transient intubation);  He was disch to a NH-Rehab facility [facility] for 21d but says he only saw a physician once during the stay;  He went to [facility] clinic/ER 5/26 w/ weakness, dysuria & mult issues=> note reviewed w/ Labs & looks like plans were made for Adm but it never happened so he went to [place] and subsequently back to [place] where he was seen by [facility] in the [facility]=> note reviewed;  He felt that pt had mult chr issues, he was given IVF, no active infection or UTI found, CXR showed persistent RLL opac & CT was ordered revealing persistent opac in RML ~3.5cm and a 1.5cm noduluar density in Rt lung apex;  He was referred to [facility] for an ASAP appt.  I have formulated this PROBLEM LIST from these EMR notes >>   Hx Pseudomonas Pneumonia in RLL 4-02/2016 w/ persistent opacity and abnormal CT Chest>  He admits to mild cough, not much sput (beige), no hemoptysis, no f/c/s, no wheezing/ congestion, +SOB/DOE w/ min activity & very weak; Hx acute hypoxemic respiratory failure secondary to vol overload and pneumonia>  He was transiently intuibated during the 4-02/2016 hospitalization; disch to NH w/o need for O2. Ex-smoker who continues to abuse chewing tobacco>  Started smoking in teens, smoked for 40 yrs up to 1ppd, quit in 1990 but he continued chewing  tobacco up to the present. Restrictive lung physiology on spirometry Generalized weakness and physical debilitation  CXR 03/07/16>  Patchy opacity in the right middle lobe. This is increased from prior exam. The left lung is clear. Heart size is normal, mild tortuosity and atherosclerosis of the thoracic aorta. No pulmonary edema. No pleural effusion or pneumothorax. Probable remote right rib fractures.  IMPRESSION: Focal right middle lobe opacity. Recurrent pneumonia versus pulmonary mass. Recommend chest CT.  CT Chest 03/07/16>  IMPRESSION:    1. Persistent opacification along the posterior aspect of the right middle lung lobe, with prominent spiculation tracking along the right major fissure. This measures approximately 3.5 cm, not including spiculation. Given persistence from April, malignancy cannot be excluded. Chronic infection could have a similar appearance. PET/CT or biopsy would be helpful for further evaluation.    2. Spiculated nodular densities at the right lung apex, measuring up to 1.5 cm, which could reflect metastatic disease.    3. Minimal hazy opacity within the right lung may reflect mild acute infection.    4. Scattered blebs at the lung apices.    5. Diffuse coronary artery calcifications seen.    6. Diffuse calcification along the pancreas, compatible sequelae of chronic pancreatitis.    7. Mild chronic right-sided hydronephrosis again noted.  Spirometry 03/12/16>  FVC=2.96 (58%), FEV1=2.11 (56%), %1sec=71%, mid-flows are reduced at 46% predicted.  This is c/o a restrictive ventilatory defect w/ superimposed small airways disease...  Ambulatory  Oximetry 03/12/16>  O2sat on RA at rest=99%;  He was only able to rise from sitting in [facility] & walk to the exam room door then had to sit back down due to weakness & lowest O2sat=98%...    LABS 03/12/16>  [place]- BS=303, A1c=8.7, BUN=67, Cr=3.82, HCO3=17;  CBC- Hg=8.4, MCV=88, Fe=102 (32%sat), Ferritin=465, B12=676;  Sed=28;  PT=43.5 &  INR=4.0  HBP, CAD- s/p stents in the past, PAF w/ a CHADSVASC=4 on Coumadin, Hypercholesterolemia>  He denies CP, palpitations, edema, but he is very weak & debilitated.    IDDM, diffuse pancreatic calcif c/w chr pancreatitis>  Hx colon cancer w/ right hemicolectomy; constipation w/ fecal burden on prev XRays/scans and required disimpaction while in rehab>   CT Abd (stone study) 01/16/16>  IMPRESSION:    1. Focal consolidation or mass in the right middle lobe lung base, incompletely imaged. This is not ordinary atelectasis, and it could represent a significant pneumonia or neoplasm. Recommend PA/lateral chest radiography at a minimum, chest CT if clinical suspicion is high.    2. Marked colonic redundancy with a very large volume of colonic stool. Small bowel is also slightly prominent in caliber, with fecalization of contents distally. However, no mechanical bowel obstruction is evident. There is no focal mass or inflammatory change evident. This could represent fecal impaction, as the rectum is distended with stool.    3. Right inguinal hernia containing unobstructed small bowel.    4. Right hydronephrosis, probably long-standing given the lack of perinephric or periureteral stranding. This may represent a UPJ obstruction.    5. Left nephrolithiasis    6. Diffuse pancreatic parenchymal calcifications, likely sequela of chronic pancreatitis.  Renal Failure>  Hx BPH, urinary incontinence, UTIs & dysuria, kidney stones w/ surg in past, renal failure w/ AKI 01/2016, ATN, and rt hydronephrosis treated w/ cysto & double J stent>    He had normal renal function in 2012 in [place] w/ BUN/Cr= 20/0.71 w/ GFR>60    Baseline labs from 04/2015 in [place] indicates that BUN/Cr= 30/1.12 w/ GFR=64 and HCO3=20 (raising the poss of an underlying RTA problem)    On Adm 01/17/16 Labs showed BUN/Cr= 67/3.70 w/ PFX=90 and a metabolic acidosis w/ WIO9=73    During the [facility] stay renal function deteriorated to BUN/Cr=  109/5.96 and he was seen by Nephrology (DrDeterding) & Urology (DrDahlstedt);  He had a renal Bx that showed ATN (no glomerular pathology) and Ultrasound showed Rt hydronephrosis & a ureteral stent placed by Urology (later removed).    By [facility] on 02/15/16 renal function had ret to baseline w/ BUN/Cr=67/3.68 and HCO3=21; per DrSchertz at disch-- f/u in our clinic in 2-4 weeks; we will contact patient and/or SNF depending on where he ends up; please put in d/c summary to avoid all nsaids/ ACE inhibitors/ ARB's/ contrast indefinitely & avoid overtreatment of HTN, keep SBP > 110 minimum (unfortunately he never got this follow up appt)...  Anemia>  Prob multifactorial w/ anemia of chronic renal failure, Coumadin anticoag, etc>    Hg was in the 10-11 range in 2012    Care Everywhere labs showed Hg~11 in [id number], then Hg=8.5 in Nov2016 (PT/INR was>10), and down to 7.9 nadir Apr2017 (PT/INR was 3.5-4.5 on adm)    Anemia work up showed normal Iron levels/ TIBC/ saturation and Ferritin values; B12 was similarly wnl...    He received 2u of PCs during his [facility] 01/2016    He has not been started on aranesp  FTT, markedly debilitated>  Prev CT Brain 05/2011 w/ mod to severe atrophy and small vessel disease>     Past Medical History  Diagnosis Date  . Recurrent nephrolithiasis 01/17/2016  . Chronic a-fib ([place]) 01/17/2016  . DM (diabetes mellitus) ([place]) 01/17/2016  . BPH (benign prostatic hyperplasia) 01/17/2016  . History of colon cancer 01/17/2016  . Constipation 01/17/2016  . HTN (hypertension), benign 01/17/2016  . Chronic pancreatitis ([place]) 01/17/2016  . Hyperlipidemia 01/17/2016  . Cardiac arrest ([place]) 01/17/2016  . Chewing tobacco use 01/17/2016    Past Surgical History  Procedure Laterality Date  . Cholecystectomy    . Colon surgery      right hemicolectomy  . Cardiac catheterization      cardiac stents  . Transurethral resection of prostate    . Hernia repair Right 09/03/2014    incisional hernia  repair/impantation of mesh/ventral hernia repair  . Lithotripsy    . Eye surgery      bilateral cataracts  . Cystoscopy w/ ureteral stent placement Right 01/20/2016    Procedure: CYSTOSCOPY WITH RETROGRADE PYELOGRAM/URETERAL STENT PLACEMENT;  Surgeon: Franchot Gallo, MD;  Location: [facility];  Service: Urology;  Laterality: Right;    Outpatient Encounter Prescriptions as of 03/12/2016  Medication Sig  . amitriptyline (ELAVIL) 25 MG tablet Take 25 mg by mouth at bedtime.  Marland Kitchen amLODipine (NORVASC) 5 MG tablet Take 1 tablet (5 mg total) by mouth daily.  Marland Kitchen atorvastatin (LIPITOR) 20 MG tablet Take 20 mg by mouth daily.  Marland Kitchen dutasteride (AVODART) 0.5 MG capsule Take 0.5 mg by mouth daily.  . insulin aspart (NOVOLOG) 100 UNIT/ML injection Inject 0-15 Units into the skin 4 (four) times daily -  before meals and at bedtime. (Patient taking differently: Inject 0-15 Units into the skin 4 (four) times daily -  before meals and at bedtime. Sliding scale.)  . insulin glargine (LANTUS) 100 UNIT/ML injection Inject 0.1 mLs (10 Units total) into the skin daily.  Marland Kitchen lisinopril (PRINIVIL,ZESTRIL) 20 MG tablet Take 20 mg by mouth daily.   . metoprolol (LOPRESSOR) 25 MG tablet Take 1 tablet (25 mg total) by mouth 2 (two) times daily.  . mineral oil liquid Take 30 mLs by mouth daily as needed for moderate constipation.  . Multiple Vitamin (MULTIVITAMIN WITH MINERALS) TABS tablet Take 1 tablet by mouth daily.  Marland Kitchen triamcinolone cream (KENALOG) 0.1 % Apply 1 application topically 2 (two) times daily as needed (rash).   . warfarin (COUMADIN) 5 MG tablet Take 1.5 tablets (7.5 mg total) by mouth as directed. TAKE 7.5MG DAILY TILL INR >2, THEN TITRATE down to keep INR>2   No facility-administered encounter medications on file as of 03/12/2016.    Allergies  Allergen Reactions  . Nitroglycerin Anaphylaxis  . Penicillins Anaphylaxis, Hives and Shortness Of Breath    Has patient had a PCN reaction causing immediate rash,  facial/tongue/throat swelling, SOB or lightheadedness with hypotension: Yes Has patient had a PCN reaction causing severe rash involving mucus membranes or skin necrosis: Yes Has patient had a PCN reaction that required hospitalization Yes Has patient had a PCN reaction occurring within the last 10 years: No If all of the above answers are "NO", then may proceed with Cephalosporin use.   . Sudafed  [Pseudoephedrine Hcl] Hives  . Tetracycline Shortness Of Breath    Other reaction(s): SHORTNESS OF BREATH  . Vancomycin Hives  . Doxycycline Rash    Other reaction(s): RASH  . Sulfa Antibiotics Rash    Family History  Problem Relation Age of  Onset  . Leukemia Father     Social History   Social History  . Marital Status: Unknown    Spouse Name: N/A  . Number of Children: N/A  . Years of Education: N/A   Occupational History  . Not on file.   Social History Main Topics  . Smoking status: Former Smoker    Types: Cigarettes    Quit date: 08/22/1978  . Smokeless tobacco: Current User    Types: Chew  . Alcohol Use: No  . Drug Use: No  . Sexual Activity: Not on file   Other Topics Concern  . Not on file   Social History Narrative    Current Medications, Allergies, Past Medical History, Past Surgical History, Family History, and Social History were reviewed in [employer] record.   Review of Systems             All symptoms NEG except where BOLDED >>  Constitutional:  F/C/S, fatigue, anorexia, unexpected weight change. HEENT:  HA, visual changes, hearing loss, earache, nasal symptoms, sore throat, mouth sores, hoarseness. Resp:  cough, sputum, hemoptysis; SOB, tightness, wheezing. Cardio:  CP, palpit, DOE, orthopnea, edema. GI:  N/V/D/C, blood in stool; reflux, abd pain, distention, gas. GU:  dysuria, freq, urgency, hematuria, flank pain, voiding difficulty. MS:  joint pain, swelling, tenderness, decr ROM; neck pain, back pain, etc. Neuro:  HA,  tremors, seizures, dizziness, syncope, weakness, numbness, gait abn. Skin:  suspicious lesions or skin rash. Heme:  adenopathy, bruising, bleeding. Psyche:  confusion, agitation, sleep disturbance, hallucinations, anxiety, depression suicidal.   Objective:   Physical Exam       Vital Signs:  Reviewed...  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 HEENT:  /AT; Conjunctiva- pink, Sclera- nonicteric, EOM-wnl, PERRLA, EACs-wax, NOSE-clear; THROAT-dry, clear & w/o lesions seen. Neck:  Supple w/ fair ROM; no JVD; normal carotid impulses w/o bruits; no thyromegaly or nodules palpated; no lymphadenopathy. Chest:  Decr BS bilat but clear w/o wheezes, rales, or rhonchi... Heart:  Regular Rhythm; norm S1 & S2 Gr1/6 SEM w/o rubs or gallops detected. Abdomen:  Soft & nontender- no guarding or rebound; decreased bowel sounds; no organomegaly or masses palpated. Ext:  DecrROM; without deformities +arthritic changes; no varicose veins, +venous insuffic, no edema;  Pulses intact w/o bruits. Neuro:  No focal neuro deficits, +gait abnormality, balance if off, diffusely weak... Derm:  No lesions noted; no rash etc. Lymph:  No cervical, supraclavicular, axillary, or inguinal adenopathy palpated.   Assessment:      Hx Pseudomonas Pneumonia in RLL 4-02/2016 w/ persistent opacity and abnormal CT Chest>  He admits to mild cough, not much sput (beige), no hemoptysis, no f/c/s, no wheezing/ congestion, +SOB/DOE w/ min activity & very weak; Hx acute hypoxemic respiratory failure secondary to vol overload and pneumonia>  He was transiently intuibated during the 4-02/2016 hospitalization; disch to NH w/o need for O2. Ex-smoker who continues to abuse chewing tobacco>  Started smoking in teens, smoked for 40 yrs up to 1ppd, quit in 1990 but he continued chewing tobacco up to the present. Restrictive lung physiology  on spirometry Generalized weakness and physical debilitation  From the pulmonary standpoint he does not need oxygen, inhalers, etc; rec to use IS regularly to get better lung expansion,and we will monitor CXR & scans over time... ROV 1 month.  HBP, CAD- s/p stents in the past, PAF  w/ a CHADSVASC=4 on Coumadin, Hypercholesterolemia>  He denies CP, palpitations, edema, but he is very weak & debilitated.    ?on Amlod5, Metop50Bid, Coumadin5- taking 1 x6d & 1/2 on Tues;  PT/INR= 4.0 & we held it x 2d and decr dose to 1/2 tab Qd x7d for now; needs Protimes by visiting nurses?  IDDM, diffuse pancreatic calcif c/w chr pancreatitis>  BS=303, A1c=8.7 on Lantus 10u Qd, and SS Humalog Tid before meals (2-10u SS)  Rec to increase Lantus to 20u Qd & continue SS coverage & monitoring BS  Hx colon cancer w/ right hemicolectomy; constipation w/ fecal burden on prev XRays/scans and required disimpaction while in rehab>    Renal Failure>  Hx BPH, urinary incontinence, UTIs & dysuria, kidney stones w/ surg in past, renal failure w/ AKI 01/2016, ATN, and rt hydronephrosis treated w/ cysto & double J stent>  He was supposed to f/u w/ Nephrology 2wks after Tx to Rehab center & have weekly labs sent to them (see DrSchertz disch note); this was never done- he needs Nephrology appt & management of his CRF  Anemia>  Prob multifactorial w/ anemia of chronic renal failure, Coumadin anticoag, etc>  He needs to get started on Aranesp per Nephrology as part of their CRF management.   FTT, markedly debilitated>   He has visiting nurses and Home Pt 2x per week but he lives in [place]...  Prev CT Brain 05/2011 w/ mod to severe atrophy and small vessel disease>      Plan:     Patient's Medications  New Prescriptions               REC to use Incentive Spirometer Q1H while wake to aide better lung expansion...   Previous Medications   AMITRIPTYLINE (ELAVIL) 25 MG TABLET    Take 25 mg by mouth at bedtime.   AMLODIPINE  (NORVASC) 5 MG TABLET    Take 1 tablet (5 mg total) by mouth daily.   ATORVASTATIN (LIPITOR) 20 MG TABLET    Take 20 mg by mouth daily.   DUTASTERIDE (AVODART) 0.5 MG CAPSULE    Take 0.5 mg by mouth daily.   INSULIN ASPART (NOVOLOG) 100 UNIT/ML INJECTION    Inject 0-15 Units into the skin 4 (four) times daily -  before meals and at bedtime.   INSULIN GLARGINE (LANTUS) 100 UNIT/ML INJECTION    Inject 0.1 mLs (10 Units total) into the skin daily.   LISINOPRIL (PRINIVIL,ZESTRIL) 20 MG TABLET    Take 20 mg by mouth daily.    METOPROLOL (LOPRESSOR) 25 MG TABLET    Take 1 tablet (25 mg total) by mouth 2 (two) times daily.   MINERAL OIL LIQUID    Take 30 mLs by mouth daily as needed for moderate constipation.   MULTIPLE VITAMIN (MULTIVITAMIN WITH MINERALS) TABS TABLET    Take 1 tablet by mouth daily.   TRIAMCINOLONE CREAM (KENALOG) 0.1 %    Apply 1 application topically 2 (two) times daily as needed (rash).    WARFARIN (COUMADIN) 5 MG TABLET    Take 1.5 tablets (7.5 mg total) by mouth as directed. TAKE 7.5MG DAILY TILL INR >2, THEN TITRATE down to keep INR>2  Modified Medications   No medications on file  Discontinued Medications   No medications on file

## 2016-03-12 NOTE — Patient Instructions (Signed)
[name] it was nice meeting you today...  We reviewed the data we had available from your recent [facility] and ER visit...  From the pulmonary standpoint I would like you to start the INCENTIVE SPIROMETER (lung exerciser)     and take severasl good deep breaths every hour or so while you are awake...    We will be following your CXR, CT scan and oxygen levels...  We will get you established ASAP w/ a PCP here at [facility] to continue managing your HBP, heart, DM, etc...    And w/ a Nephrologist to manage your chronic renal failure & the assoc anemia etc...  Today we did some follow up blood work...    We will contact you w/ the results when available...   Continue the HOME rehab & have your nurse call me for questions [phone])  Call for any questions...  Let's plan a follow up visit in 34mo, sooner if needed for problems.Marland Kitchen..Marland Kitchen

## 2016-03-13 NOTE — Telephone Encounter (Signed)
Patient's daughter calling to find out results of blood results. Dr. Kriste BasqueNadel had already contacted patient's daughter and discussed results with her. Nothing further needed.

## 2016-03-13 NOTE — Telephone Encounter (Signed)
Answering service took call from Baj[place]indy St[place], [place] [phone], returning our call.

## 2016-03-16 ENCOUNTER — Telehealth: Payer: Self-pay | Admitting: Pulmonary Disease

## 2016-03-16 NOTE — Telephone Encounter (Signed)
lmtcb X1 for [name] with Home health

## 2016-03-16 NOTE — Telephone Encounter (Signed)
Dr. Posey ReaPlotnikov?  Dr. Yetta BarreJones?  Are either of you able to accept this patient under your care?

## 2016-03-16 NOTE — Telephone Encounter (Signed)
Unfortunately, I'm not able to accept any more new patients at this time.  I'm sorry! Thank you!  

## 2016-03-17 NOTE — Telephone Encounter (Signed)
Per SN: Yes it is okay with him as long as family is on board with Hospice. Its okay for [facility] to provide care to patient and SN will be attending provider.

## 2016-03-17 NOTE — Telephone Encounter (Signed)
Spoke with [name] w/Home Health - States that daughter mentioned Hospice care and an interest in placement. [name] wanted to check with our office prior to setting anything up. There is mentioned in the last OV notes when patient was seen by SN Please advise Dr Kriste BasqueNadel if you feel Hospice care is appropriate for the patient at this time. Thanks.

## 2016-03-17 NOTE — Telephone Encounter (Signed)
[facility]. She is aware of the below. Nothing further was needed.

## 2016-03-18 ENCOUNTER — Emergency Department (HOSPITAL_COMMUNITY)
Admission: EM | Admit: 2016-03-18 | Discharge: 2016-03-18 | Disposition: A | Discharge status: Home / Self Care | Payer: Medicare Other | Attending: Emergency Medicine | Admitting: Emergency Medicine

## 2016-03-18 ENCOUNTER — Encounter (HOSPITAL_COMMUNITY): Payer: Self-pay | Admitting: Emergency Medicine

## 2016-03-18 ENCOUNTER — Telehealth: Payer: Self-pay | Admitting: Pulmonary Disease

## 2016-03-18 DIAGNOSIS — Z794 Long term (current) use of insulin: Secondary | ICD-10-CM | POA: Diagnosis not present

## 2016-03-18 DIAGNOSIS — Z85038 Personal history of other malignant neoplasm of large intestine: Secondary | ICD-10-CM | POA: Insufficient documentation

## 2016-03-18 DIAGNOSIS — Z87891 Personal history of nicotine dependence: Secondary | ICD-10-CM | POA: Diagnosis not present

## 2016-03-18 DIAGNOSIS — Z79899 Other long term (current) drug therapy: Secondary | ICD-10-CM | POA: Diagnosis not present

## 2016-03-18 DIAGNOSIS — E119 Type 2 diabetes mellitus without complications: Secondary | ICD-10-CM | POA: Insufficient documentation

## 2016-03-18 DIAGNOSIS — R1031 Right lower quadrant pain: Secondary | ICD-10-CM | POA: Diagnosis present

## 2016-03-18 DIAGNOSIS — K4090 Unilateral inguinal hernia, without obstruction or gangrene, not specified as recurrent: Secondary | ICD-10-CM | POA: Diagnosis not present

## 2016-03-18 DIAGNOSIS — I10 Essential (primary) hypertension: Secondary | ICD-10-CM | POA: Diagnosis not present

## 2016-03-18 DIAGNOSIS — E785 Hyperlipidemia, unspecified: Secondary | ICD-10-CM | POA: Diagnosis not present

## 2016-03-18 DIAGNOSIS — Z7901 Long term (current) use of anticoagulants: Secondary | ICD-10-CM | POA: Diagnosis not present

## 2016-03-18 NOTE — ED Provider Notes (Signed)
CSN: [id number]     Arrival date & time 03/18/16  1358 History   First MD Initiated Contact with Patient 03/18/16 1439     Chief Complaint  Patient presents with  . Abdominal Pain      HPI Patient presents to the emergency department with complaints of intermittent right inguinal pain and swelling over the past 2 months.  He believes he might have a right inguinal hernia.  He states he's had a hernia here repaired with mesh back many years ago but reports this is new swelling.  He denies abdominal distention.  Denies nausea vomiting.  No fevers or chills.  Continuing to have bowel movements.  No pain at this time   Past Medical History  Diagnosis Date  . Recurrent nephrolithiasis 01/17/2016  . Chronic a-fib (HCC) 01/17/2016  . DM (diabetes mellitus) (HCC) 01/17/2016  . BPH (benign prostatic hyperplasia) 01/17/2016  . History of colon cancer 01/17/2016  . Constipation 01/17/2016  . HTN (hypertension), benign 01/17/2016  . Chronic pancreatitis (HCC) 01/17/2016  . Hyperlipidemia 01/17/2016  . Cardiac arrest (HCC) 01/17/2016  . Chewing tobacco use 01/17/2016   Past Surgical History  Procedure Laterality Date  . Cholecystectomy    . Colon surgery      right hemicolectomy  . Cardiac catheterization      cardiac stents  . Transurethral resection of prostate    . Hernia repair Right 09/03/2014    incisional hernia repair/impantation of mesh/ventral hernia repair  . Lithotripsy    . Eye surgery      bilateral cataracts  . Cystoscopy w/ ureteral stent placement Right 01/20/2016    Procedure: CYSTOSCOPY WITH RETROGRADE PYELOGRAM/URETERAL STENT PLACEMENT;  Surgeon: Marcine MatarStephen Dahlstedt, MD;  Location: [facility];  Service: Urology;  Laterality: Right;   Family History  Problem Relation Age of Onset  . Leukemia Father    Social History  Substance Use Topics  . Smoking status: Former Smoker    Types: Cigarettes    Quit date: 08/22/1978  . Smokeless tobacco: Current User    Types: Chew  . Alcohol Use: No     Review of Systems  All other systems reviewed and are negative.     Allergies  Nitroglycerin; Penicillins; Sudafed ; Tetracycline; Vancomycin; Doxycycline; and Sulfa antibiotics  Home Medications   Prior to Admission medications   Medication Sig Start Date End Date Taking? Authorizing Provider  amitriptyline (ELAVIL) 25 MG tablet Take 25 mg by mouth at bedtime.    Historical Provider, MD  amLODipine (NORVASC) 5 MG tablet Take 1 tablet (5 mg total) by mouth daily. 02/15/16   Zannie CovePreetha Joseph, MD  atorvastatin (LIPITOR) 20 MG tablet Take 20 mg by mouth daily.    Historical Provider, MD  dutasteride (AVODART) 0.5 MG capsule Take 0.5 mg by mouth daily.    Historical Provider, MD  insulin aspart (NOVOLOG) 100 UNIT/ML injection Inject 0-15 Units into the skin 4 (four) times daily -  before meals and at bedtime. Patient taking differently: Inject 0-15 Units into the skin 4 (four) times daily -  before meals and at bedtime. Sliding scale. 02/15/16   Zannie CovePreetha Joseph, MD  insulin glargine (LANTUS) 100 UNIT/ML injection Inject 0.1 mLs (10 Units total) into the skin daily. 02/15/16   Zannie CovePreetha Joseph, MD  lisinopril (PRINIVIL,ZESTRIL) 20 MG tablet Take 20 mg by mouth daily.  02/12/16   Historical Provider, MD  metoprolol (LOPRESSOR) 25 MG tablet Take 1 tablet (25 mg total) by mouth 2 (two) times daily. 02/15/16  Zannie Cove, MD  mineral oil liquid Take 30 mLs by mouth daily as needed for moderate constipation.    Historical Provider, MD  Multiple Vitamin (MULTIVITAMIN WITH MINERALS) TABS tablet Take 1 tablet by mouth daily.    Historical Provider, MD  triamcinolone cream (KENALOG) 0.1 % Apply 1 application topically 2 (two) times daily as needed (rash).     Historical Provider, MD  warfarin (COUMADIN) 5 MG tablet Take 1.5 tablets (7.5 mg total) by mouth as directed. TAKE 7.5MG  DAILY TILL INR >2, THEN TITRATE down to keep INR>2 02/15/16   Zannie Cove, MD   BP 111/63 mmHg  Pulse 64  Temp(Src) 97.6 F  (36.4 C) (Oral)  Resp 14  SpO2 100% Physical Exam  Constitutional: He is oriented to person, place, and time. He appears well-developed and well-nourished.  HENT:  Head: Normocephalic.  Eyes: EOM are normal.  Neck: Normal range of motion.  Pulmonary/Chest: Effort normal.  Abdominal: He exhibits no distension. There is no tenderness.  Easily reducible right inguinal hernia without overlying skin changes  Musculoskeletal: Normal range of motion.  Neurological: He is alert and oriented to person, place, and time.  Psychiatric: He has a normal mood and affect.  Nursing note and vitals reviewed.   ED Course  Procedures (including critical care time) Labs Review Labs Reviewed - No data to display  Imaging Review No results found. I have personally reviewed and evaluated these images and lab results as part of my medical decision-making.   EKG Interpretation None      MDM   Final diagnoses:  Right inguinal hernia   Easily reducible right inguinal hernia.  Outpatient general surgery follow-up.  No signs to suggest incarceration this time.  Vital signs are normal.     Azalia Bilis, MD 03/18/16 1500

## 2016-03-18 NOTE — Discharge Instructions (Signed)

## 2016-03-18 NOTE — ED Notes (Addendum)
Pt c/o constant right sided abdominal pain x2 months.  Pt states home health nurse noticed an intermittent "bulge" to right side of abdomen. Hx of hernia. Denies N/V/D.

## 2016-03-18 NOTE — Telephone Encounter (Signed)
Spoke with Paul Guerra at [facility]. States that pt's daughter and family have decided against Hospice at this time. While his home care nurse was there today, pt was having severe abdominal pain. Pt's [facility]. Nothing further was needed at this time.

## 2016-03-19 NOTE — Telephone Encounter (Signed)
Libby,  Are there any other providers that dr Kriste Basquenadel had in mind?

## 2016-03-20 NOTE — Telephone Encounter (Signed)
Thanks for your help Amalia HaileyDustin Dr Kriste BasqueNadel said the new Dr would be fine

## 2016-03-23 ENCOUNTER — Observation Stay (HOSPITAL_COMMUNITY)
Admission: EM | Admit: 2016-03-23 | Discharge: 2016-03-26 | Disposition: A | Discharge status: Home / Self Care | Payer: Medicare Other | Attending: Internal Medicine | Admitting: Internal Medicine

## 2016-03-23 ENCOUNTER — Encounter (HOSPITAL_COMMUNITY): Payer: Self-pay

## 2016-03-23 ENCOUNTER — Emergency Department (HOSPITAL_COMMUNITY): Payer: Medicare Other

## 2016-03-23 DIAGNOSIS — E872 Acidosis: Secondary | ICD-10-CM | POA: Diagnosis not present

## 2016-03-23 DIAGNOSIS — E876 Hypokalemia: Secondary | ICD-10-CM | POA: Insufficient documentation

## 2016-03-23 DIAGNOSIS — E785 Hyperlipidemia, unspecified: Secondary | ICD-10-CM | POA: Diagnosis not present

## 2016-03-23 DIAGNOSIS — Z09 Encounter for follow-up examination after completed treatment for conditions other than malignant neoplasm: Secondary | ICD-10-CM

## 2016-03-23 DIAGNOSIS — D649 Anemia, unspecified: Secondary | ICD-10-CM | POA: Insufficient documentation

## 2016-03-23 DIAGNOSIS — Z87891 Personal history of nicotine dependence: Secondary | ICD-10-CM | POA: Insufficient documentation

## 2016-03-23 DIAGNOSIS — N184 Chronic kidney disease, stage 4 (severe): Secondary | ICD-10-CM | POA: Diagnosis not present

## 2016-03-23 DIAGNOSIS — Z79899 Other long term (current) drug therapy: Secondary | ICD-10-CM | POA: Diagnosis not present

## 2016-03-23 DIAGNOSIS — N189 Chronic kidney disease, unspecified: Secondary | ICD-10-CM

## 2016-03-23 DIAGNOSIS — Z7901 Long term (current) use of anticoagulants: Secondary | ICD-10-CM | POA: Diagnosis not present

## 2016-03-23 DIAGNOSIS — I482 Chronic atrial fibrillation: Secondary | ICD-10-CM | POA: Diagnosis not present

## 2016-03-23 DIAGNOSIS — N390 Urinary tract infection, site not specified: Secondary | ICD-10-CM | POA: Diagnosis present

## 2016-03-23 DIAGNOSIS — Z87442 Personal history of urinary calculi: Secondary | ICD-10-CM | POA: Insufficient documentation

## 2016-03-23 DIAGNOSIS — E1122 Type 2 diabetes mellitus with diabetic chronic kidney disease: Secondary | ICD-10-CM | POA: Insufficient documentation

## 2016-03-23 DIAGNOSIS — R109 Unspecified abdominal pain: Secondary | ICD-10-CM | POA: Diagnosis not present

## 2016-03-23 DIAGNOSIS — I129 Hypertensive chronic kidney disease with stage 1 through stage 4 chronic kidney disease, or unspecified chronic kidney disease: Secondary | ICD-10-CM | POA: Diagnosis not present

## 2016-03-23 DIAGNOSIS — K5900 Constipation, unspecified: Secondary | ICD-10-CM | POA: Insufficient documentation

## 2016-03-23 DIAGNOSIS — N179 Acute kidney failure, unspecified: Secondary | ICD-10-CM | POA: Diagnosis not present

## 2016-03-23 DIAGNOSIS — N19 Unspecified kidney failure: Secondary | ICD-10-CM

## 2016-03-23 DIAGNOSIS — N400 Enlarged prostate without lower urinary tract symptoms: Secondary | ICD-10-CM | POA: Insufficient documentation

## 2016-03-23 DIAGNOSIS — N1330 Unspecified hydronephrosis: Secondary | ICD-10-CM

## 2016-03-23 DIAGNOSIS — R531 Weakness: Secondary | ICD-10-CM | POA: Diagnosis present

## 2016-03-23 DIAGNOSIS — E119 Type 2 diabetes mellitus without complications: Secondary | ICD-10-CM

## 2016-03-23 DIAGNOSIS — I10 Essential (primary) hypertension: Secondary | ICD-10-CM | POA: Diagnosis present

## 2016-03-23 DIAGNOSIS — D638 Anemia in other chronic diseases classified elsewhere: Secondary | ICD-10-CM | POA: Diagnosis present

## 2016-03-23 DIAGNOSIS — Z794 Long term (current) use of insulin: Secondary | ICD-10-CM | POA: Diagnosis not present

## 2016-03-23 HISTORY — DX: Anemia, unspecified: D64.9

## 2016-03-23 HISTORY — DX: Encounter for other specified aftercare: Z51.89

## 2016-03-23 LAB — URINE MICROSCOPIC-ADD ON

## 2016-03-23 LAB — COMPREHENSIVE METABOLIC PANEL
ALK PHOS: 88 U/L (ref 38–126)
ALT: 22 U/L (ref 17–63)
AST: 21 U/L (ref 15–41)
Albumin: 3.6 g/dL (ref 3.5–5.0)
Anion gap: 9 (ref 5–15)
BILIRUBIN TOTAL: 0.6 mg/dL (ref 0.3–1.2)
BUN: 79 mg/dL — AB (ref 6–20)
CALCIUM: 8 mg/dL — AB (ref 8.9–10.3)
CO2: 11 mmol/L — ABNORMAL LOW (ref 22–32)
CREATININE: 4.13 mg/dL — AB (ref 0.61–1.24)
Chloride: 119 mmol/L — ABNORMAL HIGH (ref 101–111)
GFR calc Af Amer: 15 mL/min — ABNORMAL LOW (ref >60)
GFR, EST NON AFRICAN AMERICAN: 13 mL/min — AB (ref >60)
GLUCOSE: 160 mg/dL — AB (ref 65–99)
Potassium: 4.4 mmol/L (ref 3.5–5.1)
Sodium: 139 mmol/L (ref 135–145)
TOTAL PROTEIN: 7 g/dL (ref 6.5–8.1)

## 2016-03-23 LAB — MAGNESIUM: Magnesium: 1.6 mg/dL — ABNORMAL LOW (ref 1.7–2.4)

## 2016-03-23 LAB — I-STAT CHEM 8, ED
BUN: 88 mg/dL — AB (ref 6–20)
CHLORIDE: 118 mmol/L — AB (ref 101–111)
CREATININE: 4.3 mg/dL — AB (ref 0.61–1.24)
Calcium, Ion: 1.2 mmol/L (ref 1.13–1.30)
Glucose, Bld: 146 mg/dL — ABNORMAL HIGH (ref 65–99)
HEMATOCRIT: 23 % — AB (ref 39.0–52.0)
Hemoglobin: 7.8 g/dL — ABNORMAL LOW (ref 13.0–17.0)
Potassium: 4.2 mmol/L (ref 3.5–5.1)
Sodium: 144 mmol/L (ref 135–145)
TCO2: 13 mmol/L (ref 0–100)

## 2016-03-23 LAB — CBC WITH DIFFERENTIAL/PLATELET
BASOS ABS: 0.1 10*3/uL (ref 0.0–0.1)
Basophils Relative: 1 %
EOS ABS: 0.1 10*3/uL (ref 0.0–0.7)
Eosinophils Relative: 2 %
HCT: 20.1 % — ABNORMAL LOW (ref 39.0–52.0)
HEMOGLOBIN: 7 g/dL — AB (ref 13.0–17.0)
LYMPHS PCT: 22 %
Lymphs Abs: 1.4 10*3/uL (ref 0.7–4.0)
MCH: 31.1 pg (ref 26.0–34.0)
MCHC: 34.8 g/dL (ref 30.0–36.0)
MCV: 89.3 fL (ref 78.0–100.0)
MONO ABS: 0.5 10*3/uL (ref 0.1–1.0)
Monocytes Relative: 8 %
NEUTROS ABS: 4.1 10*3/uL (ref 1.7–7.7)
NEUTROS PCT: 67 %
PLATELETS: 244 10*3/uL (ref 150–400)
RBC: 2.25 MIL/uL — ABNORMAL LOW (ref 4.22–5.81)
RDW: 16 % — ABNORMAL HIGH (ref 11.5–15.5)
WBC: 6.2 10*3/uL (ref 4.0–10.5)

## 2016-03-23 LAB — CBG MONITORING, ED: Glucose-Capillary: 139 mg/dL — ABNORMAL HIGH (ref 65–99)

## 2016-03-23 LAB — POC OCCULT BLOOD, ED: Fecal Occult Bld: NEGATIVE

## 2016-03-23 LAB — URINALYSIS, ROUTINE W REFLEX MICROSCOPIC
Bilirubin Urine: NEGATIVE
GLUCOSE, UA: 100 mg/dL — AB
KETONES UR: NEGATIVE mg/dL
Nitrite: NEGATIVE
PH: 5 (ref 5.0–8.0)
Protein, ur: 30 mg/dL — AB
SPECIFIC GRAVITY, URINE: 1.011 (ref 1.005–1.030)

## 2016-03-23 LAB — BLOOD GAS, ARTERIAL
ACID-BASE DEFICIT: 18.1 mmol/L — AB (ref 0.0–2.0)
BICARBONATE: 9 meq/L — AB (ref 20.0–24.0)
Drawn by: 11249
O2 CONTENT: 2 L/min
O2 Saturation: 97.8 %
PATIENT TEMPERATURE: 98.1
PCO2 ART: 25.7 mmHg — AB (ref 35.0–45.0)
PH ART: 7.167 — AB (ref 7.350–7.450)
TCO2: 9.1 mmol/L (ref 0–100)
pO2, Arterial: 156 mmHg — ABNORMAL HIGH (ref 80.0–100.0)

## 2016-03-23 LAB — BRAIN NATRIURETIC PEPTIDE: B Natriuretic Peptide: 121.2 pg/mL — ABNORMAL HIGH (ref 0.0–100.0)

## 2016-03-23 LAB — PROTIME-INR
INR: 2.62 — ABNORMAL HIGH (ref 0.00–1.49)
Prothrombin Time: 27.6 seconds — ABNORMAL HIGH (ref 11.6–15.2)

## 2016-03-23 LAB — I-STAT TROPONIN, ED: TROPONIN I, POC: 0 ng/mL (ref 0.00–0.08)

## 2016-03-23 LAB — LACTIC ACID, PLASMA: LACTIC ACID, VENOUS: 1.2 mmol/L (ref 0.5–2.0)

## 2016-03-23 LAB — GLUCOSE, CAPILLARY: Glucose-Capillary: 246 mg/dL — ABNORMAL HIGH (ref 65–99)

## 2016-03-23 LAB — PHOSPHORUS: PHOSPHORUS: 7.6 mg/dL — AB (ref 2.5–4.6)

## 2016-03-23 MED ORDER — INSULIN GLARGINE 100 UNIT/ML ~~LOC~~ SOLN
10.0000 [IU] | Freq: Every day | SUBCUTANEOUS | Status: DC
  Administered 2016-03-24 – 2016-03-26 (×3): 10 [IU] via SUBCUTANEOUS
  Filled 2016-03-23 (×3): qty 0.1

## 2016-03-23 MED ORDER — SODIUM CHLORIDE 0.9 % IV SOLN
INTRAVENOUS | Status: DC
  Administered 2016-03-23: 23:00:00 via INTRAVENOUS

## 2016-03-23 MED ORDER — WARFARIN - PHARMACIST DOSING INPATIENT: Freq: Every day | Status: DC

## 2016-03-23 MED ORDER — DUTASTERIDE 0.5 MG PO CAPS
0.5000 mg | ORAL_CAPSULE | Freq: Every day | ORAL | Status: DC
  Administered 2016-03-24 – 2016-03-26 (×3): 0.5 mg via ORAL
  Filled 2016-03-23 (×3): qty 1

## 2016-03-23 MED ORDER — ACETAMINOPHEN 325 MG PO TABS: 650.0000 mg | ORAL_TABLET | Freq: Four times a day (QID) | ORAL | Status: DC | PRN

## 2016-03-23 MED ORDER — SODIUM CHLORIDE 0.9 % IV BOLUS (SEPSIS)
1000.0000 mL | Freq: Once | INTRAVENOUS | Status: AC
  Administered 2016-03-23: 1000 mL via INTRAVENOUS

## 2016-03-23 MED ORDER — SODIUM CHLORIDE 0.9 % IV BOLUS (SEPSIS)
500.0000 mL | Freq: Once | INTRAVENOUS | Status: AC
  Administered 2016-03-23: 500 mL via INTRAVENOUS

## 2016-03-23 MED ORDER — ONDANSETRON HCL 4 MG/2ML IJ SOLN: 4.0000 mg | Freq: Four times a day (QID) | INTRAMUSCULAR | Status: DC | PRN

## 2016-03-23 MED ORDER — AMITRIPTYLINE HCL 25 MG PO TABS
25.0000 mg | ORAL_TABLET | Freq: Every day | ORAL | Status: DC
  Administered 2016-03-23 – 2016-03-25 (×3): 25 mg via ORAL
  Filled 2016-03-23 (×3): qty 1

## 2016-03-23 MED ORDER — TRAZODONE HCL 50 MG PO TABS
25.0000 mg | ORAL_TABLET | Freq: Every evening | ORAL | Status: DC | PRN
  Administered 2016-03-25: 25 mg via ORAL
  Filled 2016-03-23: qty 1

## 2016-03-23 MED ORDER — WARFARIN SODIUM 2.5 MG PO TABS: 2.5000 mg | ORAL_TABLET | ORAL | Status: DC

## 2016-03-23 MED ORDER — WARFARIN SODIUM 5 MG PO TABS: 5.0000 mg | ORAL_TABLET | ORAL | Status: DC

## 2016-03-23 MED ORDER — METOPROLOL TARTRATE 50 MG PO TABS
50.0000 mg | ORAL_TABLET | Freq: Two times a day (BID) | ORAL | Status: DC
  Administered 2016-03-23 – 2016-03-26 (×7): 50 mg via ORAL
  Filled 2016-03-23 (×6): qty 1

## 2016-03-23 MED ORDER — ACETAMINOPHEN 650 MG RE SUPP: 650.0000 mg | Freq: Four times a day (QID) | RECTAL | Status: DC | PRN

## 2016-03-23 MED ORDER — ATORVASTATIN CALCIUM 10 MG PO TABS
20.0000 mg | ORAL_TABLET | Freq: Every day | ORAL | Status: DC
  Administered 2016-03-24 – 2016-03-26 (×3): 20 mg via ORAL
  Filled 2016-03-23 (×4): qty 2

## 2016-03-23 MED ORDER — SODIUM BICARBONATE 8.4 % IV SOLN
INTRAVENOUS | Status: DC
  Administered 2016-03-23: 22:00:00 via INTRAVENOUS
  Filled 2016-03-23 (×5): qty 150

## 2016-03-23 MED ORDER — INSULIN ASPART 100 UNIT/ML ~~LOC~~ SOLN
0.0000 [IU] | Freq: Three times a day (TID) | SUBCUTANEOUS | Status: DC
  Administered 2016-03-24: 5 [IU] via SUBCUTANEOUS
  Administered 2016-03-24: 8 [IU] via SUBCUTANEOUS
  Administered 2016-03-25: 3 [IU] via SUBCUTANEOUS
  Administered 2016-03-25 – 2016-03-26 (×3): 8 [IU] via SUBCUTANEOUS
  Administered 2016-03-26 (×2): 2 [IU] via SUBCUTANEOUS

## 2016-03-23 MED ORDER — CIPROFLOXACIN IN D5W 400 MG/200ML IV SOLN
400.0000 mg | Freq: Once | INTRAVENOUS | Status: AC
  Administered 2016-03-23: 400 mg via INTRAVENOUS
  Filled 2016-03-23: qty 200

## 2016-03-23 MED ORDER — SODIUM CHLORIDE 0.9% FLUSH
3.0000 mL | Freq: Two times a day (BID) | INTRAVENOUS | Status: DC
  Administered 2016-03-23 – 2016-03-25 (×3): 3 mL via INTRAVENOUS

## 2016-03-23 MED ORDER — ONDANSETRON HCL 4 MG PO TABS: 4.0000 mg | ORAL_TABLET | Freq: Four times a day (QID) | ORAL | Status: DC | PRN

## 2016-03-23 NOTE — ED Notes (Signed)
Patient transported to X-ray 

## 2016-03-23 NOTE — Progress Notes (Signed)
ANTICOAGULATION CONSULT NOTE - Initial Consult  Pharmacy Consult for Warfarin Indication: Atrial Fibrillation  Allergies  Allergen Reactions  . Nitroglycerin Anaphylaxis  . Penicillins Anaphylaxis, Hives and Shortness Of Breath    Has patient had a PCN reaction causing immediate rash, facial/tongue/throat swelling, SOB or lightheadedness with hypotension: Yes Has patient had a PCN reaction causing severe rash involving mucus membranes or skin necrosis: Yes Has patient had a PCN reaction that required hospitalization Yes Has patient had a PCN reaction occurring within the last 10 years: No If all of the above answers are "NO", then may proceed with Cephalosporin use.   . Sudafed  [Pseudoephedrine Hcl] Hives  . Tetracycline Shortness Of Breath    Other reaction(s): SHORTNESS OF BREATH  . Vancomycin Hives  . Doxycycline Rash    Other reaction(s): RASH  . Sulfa Antibiotics Rash    Patient Measurements: Height: 6\' 3"  (190.5 cm) Weight: 165 lb (74.844 kg) IBW/kg (Calculated) : 84.5 Heparin Dosing Weight:   Vital Signs: Temp: 98.1 F (36.7 C) (06/12 1407) Temp Source: Oral (06/12 1407) BP: 162/67 mmHg (06/12 1921) Pulse Rate: 65 (06/12 1921)  Labs:  Recent Labs  03/23/16 1652 03/23/16 1739 03/23/16 2115  HGB  --  7.8* 7.0*  HCT  --  23.0* 20.1*  PLT  --   --  244  LABPROT  --   --  27.6*  INR  --   --  2.62*  CREATININE 4.13* 4.30*  --     Estimated Creatinine Clearance: 15 mL/min (by C-G formula based on Cr of 4.3).   Medical History: Past Medical History  Diagnosis Date  . Recurrent nephrolithiasis 01/17/2016  . Chronic a-fib (HCC) 01/17/2016  . DM (diabetes mellitus) (HCC) 01/17/2016  . BPH (benign prostatic hyperplasia) 01/17/2016  . History of colon cancer 01/17/2016  . Constipation 01/17/2016  . HTN (hypertension), benign 01/17/2016  . Chronic pancreatitis (HCC) 01/17/2016  . Hyperlipidemia 01/17/2016  . Cardiac arrest (HCC) 01/17/2016  . Chewing tobacco use 01/17/2016   . Blood transfusion without reported diagnosis   . Anemia     Medications:  Warfarin 2.5mg  po daily except 5mg  po on Tuesdays, LD 6/12  Assessment: Paul Guerra on chronic anticoagulation for atrial fibrillation, [facility]B and weakness x 3 days.  Pharmacy consulted to resume warfarin dosing inpatient.  Pt has already had dose today.  INR therapeutic.  Noted low and decreasing hemoglobin, plts WNL.    Goal of Therapy:  INR 2-3 Monitor platelets by anticoagulation protocol: Yes   Plan:  No further warfarin tonight.  Watch CBC closely.  Daily PT/INR  Haynes Hoehnolleen Rosco Harriott, PharmD, BCPS 03/23/2016, 9:59 PM  Pager: [phone]

## 2016-03-23 NOTE — ED Provider Notes (Signed)
CSN: [id number]     Arrival date & time 03/23/16  1339 History   First MD Initiated Contact with Patient 03/23/16 1448     Chief Complaint  Patient presents with  . Weakness    X3 DAYS     (Consider location/radiation/quality/duration/timing/severity/associated sxs/prior Treatment) Patient is a 81 y.o. male presenting with weakness. The history is provided by the patient.  Weakness This is a new problem. The current episode started 2 days ago. The problem occurs constantly. The problem has been gradually worsening. Pertinent negatives include no chest pain, no abdominal pain and no shortness of breath. Nothing aggravates the symptoms. Nothing relieves the symptoms. He has tried nothing for the symptoms.    Past Medical History  Diagnosis Date  . Recurrent nephrolithiasis 01/17/2016  . Chronic a-fib (HCC) 01/17/2016  . DM (diabetes mellitus) (HCC) 01/17/2016  . BPH (benign prostatic hyperplasia) 01/17/2016  . History of colon cancer 01/17/2016  . Constipation 01/17/2016  . HTN (hypertension), benign 01/17/2016  . Chronic pancreatitis (HCC) 01/17/2016  . Hyperlipidemia 01/17/2016  . Cardiac arrest (HCC) 01/17/2016  . Chewing tobacco use 01/17/2016  . Blood transfusion without reported diagnosis   . Anemia    Past Surgical History  Procedure Laterality Date  . Cholecystectomy    . Colon surgery      right hemicolectomy  . Cardiac catheterization      cardiac stents  . Transurethral resection of prostate    . Hernia repair Right 09/03/2014    incisional hernia repair/impantation of mesh/ventral hernia repair  . Lithotripsy    . Eye surgery      bilateral cataracts  . Cystoscopy w/ ureteral stent placement Right 01/20/2016    Procedure: CYSTOSCOPY WITH RETROGRADE PYELOGRAM/URETERAL STENT PLACEMENT;  Surgeon: Marcine Matar, MD;  Location: [facility];  Service: Urology;  Laterality: Right;   Family History  Problem Relation Age of Onset  . Leukemia Father    Social History  Substance Use  Topics  . Smoking status: Former Smoker    Types: Cigarettes    Quit date: 08/22/1978  . Smokeless tobacco: Current User    Types: Chew  . Alcohol Use: No    Review of Systems  Respiratory: Negative for shortness of breath.   Cardiovascular: Negative for chest pain.  Gastrointestinal: Negative for abdominal pain.  Neurological: Positive for weakness.  All other systems reviewed and are negative.     Allergies  Nitroglycerin; Penicillins; Sudafed ; Tetracycline; Vancomycin; Doxycycline; and Sulfa antibiotics  Home Medications   Prior to Admission medications   Medication Sig Start Date End Date Taking? Authorizing Provider  amitriptyline (ELAVIL) 25 MG tablet Take 25 mg by mouth at bedtime.   Yes Historical Provider, MD  amLODipine (NORVASC) 2.5 MG tablet Take 2.5 mg by mouth daily.   Yes Historical Provider, MD  atorvastatin (LIPITOR) 20 MG tablet Take 20 mg by mouth daily.   Yes Historical Provider, MD  dutasteride (AVODART) 0.5 MG capsule Take 0.5 mg by mouth daily.   Yes Historical Provider, MD  HUMALOG 100 UNIT/ML injection Inject 2-12 mg into the skin 3 (three) times daily before meals. 02/17/16  Yes Historical Provider, MD  insulin glargine (LANTUS) 100 UNIT/ML injection Inject 0.1 mLs (10 Units total) into the skin daily. 02/15/16  Yes Zannie Cove, MD  lisinopril (PRINIVIL,ZESTRIL) 20 MG tablet Take 20 mg by mouth daily.  02/12/16  Yes Historical Provider, MD  metoprolol (LOPRESSOR) 50 MG tablet Take 50 mg by mouth 2 (two) times daily.  Yes Historical Provider, MD  mineral oil liquid Take 30 mLs by mouth daily as needed for moderate constipation. Reported on 03/23/2016   Yes Historical Provider, MD  Multiple Vitamin (MULTIVITAMIN WITH MINERALS) TABS tablet Take 1 tablet by mouth daily.   Yes Historical Provider, MD  Probiotic Product (PROBIOTIC PO) Take 15 mLs by mouth 2 (two) times daily.   Yes Historical Provider, MD  triamcinolone cream (KENALOG) 0.1 % Apply 1 application  topically 2 (two) times daily as needed (rash).    Yes Historical Provider, MD  warfarin (COUMADIN) 5 MG tablet Take 1.5 tablets (7.5 mg total) by mouth as directed. TAKE 7.5MG  DAILY TILL INR >2, THEN TITRATE down to keep INR>2 Patient taking differently: Take 0.5-1 mg by mouth daily. Takes 2.5mg  everyday except 5mg  on Tuesdays 02/15/16  Yes Zannie Cove, MD  amLODipine (NORVASC) 5 MG tablet Take 1 tablet (5 mg total) by mouth daily. Patient not taking: Reported on 03/23/2016 02/15/16   Zannie Cove, MD  insulin aspart (NOVOLOG) 100 UNIT/ML injection Inject 0-15 Units into the skin 4 (four) times daily -  before meals and at bedtime. Patient not taking: Reported on 03/23/2016 02/15/16   Zannie Cove, MD  metoprolol (LOPRESSOR) 25 MG tablet Take 1 tablet (25 mg total) by mouth 2 (two) times daily. Patient not taking: Reported on 03/23/2016 02/15/16   Zannie Cove, MD   BP 120/90 mmHg  Pulse 66  Temp(Src) 98.1 F (36.7 C) (Oral)  Ht 6\' 3"  (1.905 m)  Wt 165 lb (74.844 kg)  BMI 20.62 kg/m2  SpO2 100% Physical Exam  Constitutional: He is oriented to person, place, and time. He appears well-developed. He appears listless. He has a sickly appearance. No distress.  HENT:  Head: Normocephalic and atraumatic.  Eyes: Conjunctivae are normal.  Neck: Neck supple. No tracheal deviation present.  Cardiovascular: Normal rate, regular rhythm and normal heart sounds.   Pulmonary/Chest: Effort normal and breath sounds normal. No respiratory distress.  Abdominal: Soft. He exhibits no distension.  Neurological: He is oriented to person, place, and time. He appears listless. GCS eye subscore is 4. GCS verbal subscore is 5. GCS motor subscore is 6.  Skin: Skin is warm and dry. There is pallor.  Psychiatric: He has a normal mood and affect.    ED Course  Procedures (including critical care time) Labs Review Labs Reviewed  BRAIN NATRIURETIC PEPTIDE - Abnormal; Notable for the following:    B Natriuretic  Peptide 121.2 (*)    All other components within normal limits  URINALYSIS, ROUTINE W REFLEX MICROSCOPIC (NOT AT [facility]) - Abnormal; Notable for the following:    APPearance CLOUDY (*)    Glucose, UA 100 (*)    Hgb urine dipstick LARGE (*)    Protein, ur 30 (*)    Leukocytes, UA LARGE (*)    All other components within normal limits  COMPREHENSIVE METABOLIC PANEL - Abnormal; Notable for the following:    Chloride 119 (*)    CO2 11 (*)    Glucose, Bld 160 (*)    BUN 79 (*)    Creatinine, Ser 4.13 (*)    Calcium 8.0 (*)    GFR calc non Af Amer 13 (*)    GFR calc Af Amer 15 (*)    All other components within normal limits  URINE MICROSCOPIC-ADD ON - Abnormal; Notable for the following:    Squamous Epithelial / LPF 0-5 (*)    Bacteria, UA MANY (*)    All other  components within normal limits  CBC WITH DIFFERENTIAL/PLATELET - Abnormal; Notable for the following:    RBC 2.25 (*)    Hemoglobin 7.0 (*)    HCT 20.1 (*)    RDW 16.0 (*)    All other components within normal limits  BLOOD GAS, ARTERIAL - Abnormal; Notable for the following:    pH, Arterial 7.167 (*)    pCO2 arterial 25.7 (*)    pO2, Arterial 156 (*)    Bicarbonate 9.0 (*)    Acid-base deficit 18.1 (*)    All other components within normal limits  MAGNESIUM - Abnormal; Notable for the following:    Magnesium 1.6 (*)    All other components within normal limits  PHOSPHORUS - Abnormal; Notable for the following:    Phosphorus 7.6 (*)    All other components within normal limits  PROTIME-INR - Abnormal; Notable for the following:    Prothrombin Time 27.6 (*)    INR 2.62 (*)    All other components within normal limits  GLUCOSE, CAPILLARY - Abnormal; Notable for the following:    Glucose-Capillary 246 (*)    All other components within normal limits  I-STAT CHEM 8, ED - Abnormal; Notable for the following:    Chloride 118 (*)    BUN 88 (*)    Creatinine, Ser 4.30 (*)    Glucose, Bld 146 (*)    Hemoglobin 7.8 (*)     HCT 23.0 (*)    All other components within normal limits  CBG MONITORING, ED - Abnormal; Notable for the following:    Glucose-Capillary 139 (*)    All other components within normal limits  URINE CULTURE  CULTURE, BLOOD (ROUTINE X 2)  CULTURE, BLOOD (ROUTINE X 2)  LACTIC ACID, PLASMA  LACTIC ACID, PLASMA  PROTIME-INR  BASIC METABOLIC PANEL  CBC  I-STAT TROPOININ, ED  POC OCCULT BLOOD, ED  TYPE AND SCREEN    Imaging Review Dg Chest 2 View  03/23/2016  CLINICAL DATA:  Shortness of breath and weakness for 3 days EXAM: CHEST  2 VIEW COMPARISON:  03/07/2016 FINDINGS: Cardiac shadow is stable. The lungs are again well aerated. Stable right middle lobe opacity is noted. This is similar to that seen on the prior exam. No new focal infiltrate is seen. No sizable effusion is noted. IMPRESSION: Stable changes in the right lung base. Electronically Signed   By: Alcide CleverMark  Lukens M.D.   On: 03/23/2016 17:46   I have personally reviewed and evaluated these images and lab results as part of my medical decision-making.   EKG Interpretation   Date/Time:  Monday March 23 2016 16:30:47 EDT Ventricular Rate:  68 PR Interval:    QRS Duration: 95 QT Interval:  454 QTC Calculation: 483 R Axis:   -3 Text Interpretation:  Junctional rhythm Abnormal R-wave progression, early  transition Borderline prolonged QT interval No significant change since  last tracing Confirmed by Hartman Minahan MD, Ayra Hodgdon [id number]) on 03/23/2016 4:54:54 PM      MDM   Final diagnoses:  Acute on chronic renal failure (HCC)  Urinary tract infection without hematuria, site unspecified    81 y.o. male presents with ongoing weakness and concern for acute on chronic anemia. He appears to have persistent pyuria and worsening kidney function, I am concerned for multifactorial anemia that is acute on chronic as well as infection, covered with cipro with plan to monitor and transfuse if needed. Renal failure is concerning for metabolic  acidosis as well as uremia which  may be contributing to symptoms. Not hyperkalemic and no indication for emergent dialysis yet but Pt may require at some point. HDS throughout ED course and no emergent transfusion indicated here. Hospitalist was consulted for admission and will see the patient in the emergency department.     Lyndal Pulley, MD 03/24/16 [phone]

## 2016-03-23 NOTE — ED Notes (Signed)
PT RECEIVED FROM HOME VIA EMS FOR GENERALIZED WEAKNESS X3 DAYS. INITIAL O2 SAT WAS 50%. O2 AT 4L VIA N/C AND NS GIVEN PTA. REPEAT O2 SAT WAS 96%.

## 2016-03-24 ENCOUNTER — Observation Stay (HOSPITAL_COMMUNITY): Payer: Medicare Other

## 2016-03-24 DIAGNOSIS — E0821 Diabetes mellitus due to underlying condition with diabetic nephropathy: Secondary | ICD-10-CM | POA: Diagnosis not present

## 2016-03-24 DIAGNOSIS — N400 Enlarged prostate without lower urinary tract symptoms: Secondary | ICD-10-CM | POA: Diagnosis not present

## 2016-03-24 DIAGNOSIS — I10 Essential (primary) hypertension: Secondary | ICD-10-CM | POA: Diagnosis not present

## 2016-03-24 DIAGNOSIS — Z794 Long term (current) use of insulin: Secondary | ICD-10-CM

## 2016-03-24 DIAGNOSIS — N179 Acute kidney failure, unspecified: Secondary | ICD-10-CM | POA: Diagnosis not present

## 2016-03-24 DIAGNOSIS — E785 Hyperlipidemia, unspecified: Secondary | ICD-10-CM

## 2016-03-24 LAB — GLUCOSE, CAPILLARY
GLUCOSE-CAPILLARY: 222 mg/dL — AB (ref 65–99)
GLUCOSE-CAPILLARY: 73 mg/dL (ref 65–99)
GLUCOSE-CAPILLARY: 199 mg/dL — AB (ref 65–99)
Glucose-Capillary: 290 mg/dL — ABNORMAL HIGH (ref 65–99)

## 2016-03-24 LAB — BASIC METABOLIC PANEL
Anion gap: 8 (ref 5–15)
BUN: 79 mg/dL — AB (ref 6–20)
CHLORIDE: 120 mmol/L — AB (ref 101–111)
CO2: 13 mmol/L — AB (ref 22–32)
CREATININE: 4.09 mg/dL — AB (ref 0.61–1.24)
Calcium: 7.8 mg/dL — ABNORMAL LOW (ref 8.9–10.3)
GFR calc Af Amer: 15 mL/min — ABNORMAL LOW (ref >60)
GFR calc non Af Amer: 13 mL/min — ABNORMAL LOW (ref >60)
GLUCOSE: 286 mg/dL — AB (ref 65–99)
Potassium: 3.7 mmol/L (ref 3.5–5.1)
Sodium: 141 mmol/L (ref 135–145)

## 2016-03-24 LAB — PROTIME-INR
INR: 2.67 — ABNORMAL HIGH (ref 0.00–1.49)
Prothrombin Time: 28 seconds — ABNORMAL HIGH (ref 11.6–15.2)

## 2016-03-24 LAB — CBC
HCT: 19.4 % — ABNORMAL LOW (ref 39.0–52.0)
Hemoglobin: 6.9 g/dL — CL (ref 13.0–17.0)
MCH: 30.8 pg (ref 26.0–34.0)
MCHC: 35.6 g/dL (ref 30.0–36.0)
MCV: 86.6 fL (ref 78.0–100.0)
PLATELETS: 248 10*3/uL (ref 150–400)
RBC: 2.24 MIL/uL — ABNORMAL LOW (ref 4.22–5.81)
RDW: 15.9 % — ABNORMAL HIGH (ref 11.5–15.5)
WBC: 6 10*3/uL (ref 4.0–10.5)

## 2016-03-24 LAB — LACTIC ACID, PLASMA: Lactic Acid, Venous: 1 mmol/L (ref 0.5–2.0)

## 2016-03-24 LAB — HEMOGLOBIN AND HEMATOCRIT, BLOOD
HCT: 26.4 % — ABNORMAL LOW (ref 39.0–52.0)
Hemoglobin: 9.5 g/dL — ABNORMAL LOW (ref 13.0–17.0)

## 2016-03-24 LAB — PREPARE RBC (CROSSMATCH)

## 2016-03-24 MED ORDER — SORBITOL 70 % SOLN: 30.0000 mL | Freq: Every day | Status: DC | PRN

## 2016-03-24 MED ORDER — BISACODYL 10 MG RE SUPP
10.0000 mg | Freq: Once | RECTAL | Status: AC
  Administered 2016-03-24: 10 mg via RECTAL
  Filled 2016-03-24: qty 1

## 2016-03-24 MED ORDER — MAGNESIUM SULFATE 2 GM/50ML IV SOLN
2.0000 g | Freq: Once | INTRAVENOUS | Status: AC
  Administered 2016-03-24: 2 g via INTRAVENOUS
  Filled 2016-03-24: qty 50

## 2016-03-24 MED ORDER — SODIUM CHLORIDE 0.9 % IV SOLN
Freq: Once | INTRAVENOUS | Status: AC
  Administered 2016-03-24: 05:00:00 via INTRAVENOUS

## 2016-03-24 MED ORDER — MAGNESIUM SULFATE 2 GM/50ML IV SOLN
2.0000 g | Freq: Once | INTRAVENOUS | Status: DC
  Administered 2016-03-24: 2 g via INTRAVENOUS
  Filled 2016-03-24: qty 50

## 2016-03-24 MED ORDER — WARFARIN SODIUM 2.5 MG PO TABS
2.5000 mg | ORAL_TABLET | Freq: Once | ORAL | Status: AC
  Administered 2016-03-24: 2.5 mg via ORAL
  Filled 2016-03-24: qty 1

## 2016-03-24 MED ORDER — CIPROFLOXACIN IN D5W 400 MG/200ML IV SOLN
400.0000 mg | INTRAVENOUS | Status: DC
  Administered 2016-03-24 – 2016-03-25 (×2): 400 mg via INTRAVENOUS
  Filled 2016-03-24 (×2): qty 200

## 2016-03-24 NOTE — Consult Note (Addendum)
Renal Service Consult Note [facility]  SOPHIA CUBERO 03/24/2016 Sol Blazing Requesting Physician:  Dr Tyrell Antonio  Reason for Consult:  CKD stage 4/5 HPI: The patient is a 81 y.o. year-old with hx of HTN, DM, afib and CKD was admitted here in April/ May this spring with acute on CKD, fungal UTI, R sided hydro and dehydration. The R kidney was stented w JJ stent, creat didn't recover. Hosp stay was complicated by resp failure/ VDRF, pseudomonas PNA.  Renal bx done which showed "severe ATN" and underlying HTNsive nephrosclerosis.  Creat at dc was around 3.62  Patient admitted now for generalized weakness x 2-3 days, low intake of water.  No fever or ^WBC.  Hb was 6.9 in ED.  Creat was 4.09.  Asked to see for CKD and/or AKI.    Patient denies any N/V, abd pain, diarrhea.  No SOB or CP.  Marginal historian.  Lives next to his daughter.  Widower. 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 Quit working in January 05, 2016 when he got sick.  Wife died 2001-01-04.  Only daughter lives next to him in a separate house.  No etoh /tob.  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  GB surg. R colon removed prob for Ca.  Cardiac stents, TURP, hernia repair, lithotripsy, cataract surg.    ROS  denies CP  no joint pain   no HA  no blurry vision  no rash  no diarrhea  no nausea/ vomiting  no dysuria  no difficulty voiding  no change in urine color    Past Medical History  Past Medical History  Diagnosis Date  . Recurrent nephrolithiasis 01/17/2016  . Chronic a-fib ([place]) 01/17/2016  . DM (diabetes mellitus) ([place]) 01/17/2016  . BPH (benign prostatic hyperplasia) 01/17/2016  . History of colon cancer 01/17/2016  . Constipation 01/17/2016  . HTN (hypertension), benign 01/17/2016  . Chronic pancreatitis ([place]) 01/17/2016  . Hyperlipidemia 01/17/2016  . Cardiac arrest ([place]) 01/17/2016  . Chewing tobacco use 01/17/2016  . Blood transfusion without reported diagnosis   . Anemia    Past Surgical  History  Past Surgical History  Procedure Laterality Date  . Cholecystectomy    . Colon surgery      right hemicolectomy  . Cardiac catheterization      cardiac stents  . Transurethral resection of prostate    . Hernia repair Right 09/03/2014    incisional hernia repair/impantation of mesh/ventral hernia repair  . Lithotripsy    . Eye surgery      bilateral cataracts  . Cystoscopy w/ ureteral stent placement Right 01/20/2016    Procedure: CYSTOSCOPY WITH RETROGRADE PYELOGRAM/URETERAL STENT PLACEMENT;  Surgeon: Franchot Gallo, MD;  Location: [facility];  Service: Urology;  Laterality: Right;   Family History  Family History  Problem Relation Age of Onset  . Leukemia Father    Social History  reports that he quit smoking about 37 years ago. His smoking use included Cigarettes. His smokeless tobacco use includes Chew. He reports that he does not drink alcohol or use illicit drugs. Allergies  Allergies  Allergen Reactions  . Nitroglycerin Anaphylaxis  . Penicillins Anaphylaxis, Hives and Shortness Of Breath    Has patient had a PCN reaction causing immediate rash, facial/tongue/throat swelling, SOB or lightheadedness with hypotension: Yes Has patient had a PCN reaction causing severe rash involving mucus membranes or skin necrosis: Yes Has patient had a PCN reaction that required hospitalization Yes Has  patient had a PCN reaction occurring within the last 10 years: No If all of the above answers are "NO", then may proceed with Cephalosporin use.   . Sudafed  [Pseudoephedrine Hcl] Hives  . Tetracycline Shortness Of Breath    Other reaction(s): SHORTNESS OF BREATH  . Vancomycin Hives  . Doxycycline Rash    Other reaction(s): RASH  . Sulfa Antibiotics Rash   Home medications Prior to Admission medications   Medication Sig Start Date End Date Taking? Authorizing Provider  amitriptyline (ELAVIL) 25 MG tablet Take 25 mg by mouth at bedtime.   Yes Historical Provider, MD  amLODipine  (NORVASC) 2.5 MG tablet Take 2.5 mg by mouth daily.   Yes Historical Provider, MD  atorvastatin (LIPITOR) 20 MG tablet Take 20 mg by mouth daily.   Yes Historical Provider, MD  dutasteride (AVODART) 0.5 MG capsule Take 0.5 mg by mouth daily.   Yes Historical Provider, MD  HUMALOG 100 UNIT/ML injection Inject 2-12 mg into the skin 3 (three) times daily before meals. 02/17/16  Yes Historical Provider, MD  insulin glargine (LANTUS) 100 UNIT/ML injection Inject 0.1 mLs (10 Units total) into the skin daily. 02/15/16  Yes Domenic Polite, MD  lisinopril (PRINIVIL,ZESTRIL) 20 MG tablet Take 20 mg by mouth daily.  02/12/16  Yes Historical Provider, MD  metoprolol (LOPRESSOR) 50 MG tablet Take 50 mg by mouth 2 (two) times daily.   Yes Historical Provider, MD  mineral oil liquid Take 30 mLs by mouth daily as needed for moderate constipation. Reported on 03/23/2016   Yes Historical Provider, MD  Multiple Vitamin (MULTIVITAMIN WITH MINERALS) TABS tablet Take 1 tablet by mouth daily.   Yes Historical Provider, MD  Probiotic Product (PROBIOTIC PO) Take 15 mLs by mouth 2 (two) times daily.   Yes Historical Provider, MD  triamcinolone cream (KENALOG) 0.1 % Apply 1 application topically 2 (two) times daily as needed (rash).    Yes Historical Provider, MD  warfarin (COUMADIN) 5 MG tablet Take 1.5 tablets (7.5 mg total) by mouth as directed. TAKE 7.5MG DAILY TILL INR >2, THEN TITRATE down to keep INR>2 Patient taking differently: Take 0.5-1 mg by mouth daily. Takes 2.61m everyday except 517mon Tuesdays 02/15/16  Yes PrDomenic PoliteMD  amLODipine (NORVASC) 5 MG tablet Take 1 tablet (5 mg total) by mouth daily. Patient not taking: Reported on 03/23/2016 02/15/16   PrDomenic PoliteMD  insulin aspart (NOVOLOG) 100 UNIT/ML injection Inject 0-15 Units into the skin 4 (four) times daily -  before meals and at bedtime. Patient not taking: Reported on 03/23/2016 02/15/16   PrDomenic PoliteMD  metoprolol (LOPRESSOR) 25 MG tablet Take 1 tablet  (25 mg total) by mouth 2 (two) times daily. Patient not taking: Reported on 03/23/2016 02/15/16   PrDomenic PoliteMD   Liver Function Tests  Recent Labs Lab 03/23/16 1652  AST 21  ALT 22  ALKPHOS 88  BILITOT 0.6  PROT 7.0  ALBUMIN 3.6   No results for input(s): LIPASE, AMYLASE in the last 168 hours. CBC  Recent Labs Lab 03/23/16 1739 03/23/16 2115 03/24/16 0109  WBC  --  6.2 6.0  NEUTROABS  --  4.1  --   HGB 7.8* 7.0* 6.9*  HCT 23.0* 20.1* 19.4*  MCV  --  89.3 86.6  PLT  --  244 24810 Basic Metabolic Panel  Recent Labs Lab 03/23/16 1652 03/23/16 1739 03/23/16 2115 03/24/16 0109  NA 139 144  --  141  K 4.4 4.2  --  3.7  CL 119* 118*  --  120*  CO2 11*  --   --  13*  GLUCOSE 160* 146*  --  286*  BUN 79* 88*  --  79*  CREATININE 4.13* 4.30*  --  4.09*  CALCIUM 8.0*  --   --  7.8*  PHOS  --   --  7.6*  --    Iron/TIBC/Ferritin/ %Sat    Component Value Date/Time   IRON 102 03/12/2016 1159   TIBC 245* 01/17/2016 1618   FERRITIN 464.5* 03/12/2016 1159   IRONPCTSAT 32.1 03/12/2016 1159    Filed Vitals:   03/24/16 0714 03/24/16 1055 03/24/16 1110 03/24/16 1402  BP: 158/64 172/69 159/66 146/73  Pulse: 70 70 67 60  Temp: 98.5 F (36.9 C) 98.1 F (36.7 C) 98.5 F (36.9 C) 98.5 F (36.9 C)  TempSrc: Oral Axillary Axillary Axillary  Resp: 18 18 18 18   Height:      Weight:      SpO2: 100% 99% 100% 100%   Exam Gen elderly WM no distress, some mild myoclonic jerking or UE's Cleft palate repaired, speech impediment mild Yelling out to staff No rash, cyanosis or gangrene Sclera anicteric, throat clear and dry No jvd or bruits Chest clear bilat RRR no MRG Abd soft ntnd no mass or ascites +bs GU normal male MS no joint effusions or deformity Ext no LE edema / no wounds or ulcers Neuro is awake but falls asleep intermittently Nonfocal motor exam, +asterixis, mild  6/12 UA tntc wbc/rbc, 30 prot CXR R mid lung opacity unchanged, no acute  dz  Assessment: 1  Acute on CKD vs progression of CKD - pt has not improved 6 wks after renal bx which showed "severe ATN" and background HTN'sive renal disease.  Now creat is worse and he may be uremic, has asterixis.  Agree w IVF"s for now.  Need to discuss issue of dialysis with his family, have set up family meeting for tomorrow.  2  Volume looks a little dry 3  HTN 4  R hydro sp stenting April 2017 5  Chron afib 6  Anemia to get prbc's 7  Met acidosis - getting IV NaHCO3 now   Plan - as above, plan family mtg for tomorrow  Kelly Splinter MD [place] pager [phone]    cell [phone] 03/24/2016, 4:14 PM

## 2016-03-24 NOTE — Progress Notes (Signed)
ANTICOAGULATION CONSULT NOTE   Pharmacy Consult for Warfarin Indication: Atrial Fibrillation  Allergies  Allergen Reactions  . Nitroglycerin Anaphylaxis  . Penicillins Anaphylaxis, Hives and Shortness Of Breath    Has patient had a PCN reaction causing immediate rash, facial/tongue/throat swelling, SOB or lightheadedness with hypotension: Yes Has patient had a PCN reaction causing severe rash involving mucus membranes or skin necrosis: Yes Has patient had a PCN reaction that required hospitalization Yes Has patient had a PCN reaction occurring within the last 10 years: No If all of the above answers are "NO", then may proceed with Cephalosporin use.   . Sudafed  [Pseudoephedrine Hcl] Hives  . Tetracycline Shortness Of Breath    Other reaction(s): SHORTNESS OF BREATH  . Vancomycin Hives  . Doxycycline Rash    Other reaction(s): RASH  . Sulfa Antibiotics Rash    Patient Measurements: Height: 6\' 3"  (190.5 cm) Weight: 160 lb (72.576 kg) IBW/kg (Calculated) : 84.5 Heparin Dosing Weight:   Vital Signs: Temp: 98.5 F (36.9 C) (06/13 0714) Temp Source: Oral (06/13 0714) BP: 158/64 mmHg (06/13 0714) Pulse Rate: 70 (06/13 0714)  Labs:  Recent Labs  03/23/16 1652  03/23/16 1739 03/23/16 2115 03/24/16 0109  HGB  --   < > 7.8* 7.0* 6.9*  HCT  --   --  23.0* 20.1* 19.4*  PLT  --   --   --  244 248  LABPROT  --   --   --  27.6* 28.0*  INR  --   --   --  2.62* 2.67*  CREATININE 4.13*  --  4.30*  --  4.09*  < > = values in this interval not displayed.  Estimated Creatinine Clearance: 15.3 mL/min (by C-G formula based on Cr of 4.09).  Assessment: 80 yoM on chronic anticoagulation for atrial fibrillation, [facility] weakness x 3 days.  Pharmacy consulted to resume warfarin dosing inpatient.  INR therapeutic at admission.  Noted low and decreasing hemoglobin, plts WNL.    Medications:  Warfarin 2.5mg  po daily except 5mg  po on Tuesdays, LD 6/12  Today,  03/24/2016  INR remains therapeutic  CBC: Hgb low but consistent with last evening's value, pltc WNL  AKI on CKD  Diet: heart healthy/carb modified  Drug-drug interactions: ciprofloxacin may enhance warfarin's effects and increase INR  Goal of Therapy:  INR 2-3 Monitor platelets by anticoagulation protocol: Yes   Plan:   Warfarin 2.5mg  PO x 1 tonight, use lower dose for start of ciprofloxacin  Daily INR  Monitor for bleeding, Hgb trend  Juliette Alcideustin Oliviarose Punch, PharmD, BCPS.   Pager: [phone] 03/24/2016 7:53 AM

## 2016-03-24 NOTE — H&P (Signed)
[facility] History and Physical  Paul Guerra ZOX:[id number] DOB: 1934-12-03 DOA: 03/23/2016  Referring physician: n/a PCP: Michele Mcalpine, MD   Chief Complaint: "He just looks somewhat pale."-Wife  HPI: Paul Guerra is a 81 y.o. male with past medical history significant for diabetes, hypertension, cardiac arrest and blood transfusion presents to the emergency room accompanied chief complaint weakness. Wife and patient state the patient has been weak over the last 2 days. Worse the day he presented to the emergency room the day prior. Patient states that other than feeling weak and have really any symptoms. Patient does have a history of low oral intake of water. Patient denies any dysuria, hematuria, urinary frequency.  Review of Systems:  As per HPI otherwise 10 point review of systems negative.   Past Medical History  Diagnosis Date  . Recurrent nephrolithiasis 01/17/2016  . Chronic a-fib (HCC) 01/17/2016  . DM (diabetes mellitus) ([facility]) 01/17/2016  . BPH (benign prostatic hyperplasia) 01/17/2016  . History of colon cancer 01/17/2016  . Constipation 01/17/2016  . HTN (hypertension), benign 01/17/2016  . Chronic pancreatitis (HCC) 01/17/2016  . Hyperlipidemia 01/17/2016  . Cardiac arrest (H[facility]) 01/17/2016  . Chewing tobacco use 01/17/2016  . Blood transfusion without reported diagnosis   . Anemia    Past Surgical History  Procedure Laterality Date  . Cholecystectomy    . Colon surgery      right hemicolectomy  . Cardiac catheterization      cardiac stents  . Transurethral resection of prostate    . Hernia repair Right 09/03/2014    incisional hernia repair/impantation of mesh/ventral hernia repair  . Lithotripsy    . Eye surgery      bilateral cataracts  . Cystoscopy w/ ureteral stent placement Right 01/20/2016    Procedure: CYSTOSCOPY WITH RETROGRADE PYELOGRAM/URETERAL STENT PLACEMENT;  Surgeon: Marcine Matar, MD;  Location: [facility];  Service: Urology;  Laterality: Right;   Social  History:  reports that he quit smoking about 37 years ago. His smoking use included Cigarettes. His smokeless tobacco use includes Chew. He reports that he does not drink alcohol or use illicit drugs.  Allergies  Allergen Reactions  . Nitroglycerin Anaphylaxis  . Penicillins Anaphylaxis, Hives and Shortness Of Breath    Has patient had a PCN reaction causing immediate rash, facial/tongue/throat swelling, SOB or lightheadedness with hypotension: Yes Has patient had a PCN reaction causing severe rash involving mucus membranes or skin necrosis: Yes Has patient had a PCN reaction that required hospitalization Yes Has patient had a PCN reaction occurring within the last 10 years: No If all of the above answers are "NO", then may proceed with Cephalosporin use.   . Sudafed  [Pseudoephedrine Hcl] Hives  . Tetracycline Shortness Of Breath    Other reaction(s): SHORTNESS OF BREATH  . Vancomycin Hives  . Doxycycline Rash    Other reaction(s): RASH  . Sulfa Antibiotics Rash    Family History  Problem Relation Age of Onset  . Leukemia Father      Prior to Admission medications   Medication Sig Start Date End Date Taking? Authorizing Provider  amitriptyline (ELAVIL) 25 MG tablet Take 25 mg by mouth at bedtime.   Yes Historical Provider, MD  amLODipine (NORVASC) 2.5 MG tablet Take 2.5 mg by mouth daily.   Yes Historical Provider, MD  atorvastatin (LIPITOR) 20 MG tablet Take 20 mg by mouth daily.   Yes Historical Provider, MD  dutasteride (AVODART) 0.5 MG capsule Take 0.5 mg by mouth daily.  Yes Historical Provider, MD  HUMALOG 100 UNIT/ML injection Inject 2-12 mg into the skin 3 (three) times daily before meals. 02/17/16  Yes Historical Provider, MD  insulin glargine (LANTUS) 100 UNIT/ML injection Inject 0.1 mLs (10 Units total) into the skin daily. 02/15/16  Yes Zannie CovePreetha Joseph, MD  lisinopril (PRINIVIL,ZESTRIL) 20 MG tablet Take 20 mg by mouth daily.  02/12/16  Yes Historical Provider, MD    metoprolol (LOPRESSOR) 50 MG tablet Take 50 mg by mouth 2 (two) times daily.   Yes Historical Provider, MD  mineral oil liquid Take 30 mLs by mouth daily as needed for moderate constipation. Reported on 03/23/2016   Yes Historical Provider, MD  Multiple Vitamin (MULTIVITAMIN WITH MINERALS) TABS tablet Take 1 tablet by mouth daily.   Yes Historical Provider, MD  Probiotic Product (PROBIOTIC PO) Take 15 mLs by mouth 2 (two) times daily.   Yes Historical Provider, MD  triamcinolone cream (KENALOG) 0.1 % Apply 1 application topically 2 (two) times daily as needed (rash).    Yes Historical Provider, MD  warfarin (COUMADIN) 5 MG tablet Take 1.5 tablets (7.5 mg total) by mouth as directed. TAKE 7.5MG  DAILY TILL INR >2, THEN TITRATE down to keep INR>2 Patient taking differently: Take 0.5-1 mg by mouth daily. Takes 2.5mg  everyday except 5mg  on Tuesdays 02/15/16  Yes Zannie CovePreetha Joseph, MD  amLODipine (NORVASC) 5 MG tablet Take 1 tablet (5 mg total) by mouth daily. Patient not taking: Reported on 03/23/2016 02/15/16   Zannie CovePreetha Joseph, MD  insulin aspart (NOVOLOG) 100 UNIT/ML injection Inject 0-15 Units into the skin 4 (four) times daily -  before meals and at bedtime. Patient not taking: Reported on 03/23/2016 02/15/16   Zannie CovePreetha Joseph, MD  metoprolol (LOPRESSOR) 25 MG tablet Take 1 tablet (25 mg total) by mouth 2 (two) times daily. Patient not taking: Reported on 03/23/2016 02/15/16   Zannie CovePreetha Joseph, MD   Physical Exam: Filed Vitals:   03/23/16 1921 03/23/16 2154 03/24/16 0422 03/24/16 0441  BP: 162/67 161/69 143/61 139/72  Pulse: 65 74 69 76  Temp:  98.2 F (36.8 C) 98.9 F (37.2 C) 98.4 F (36.9 C)  TempSrc:  Oral Oral Oral  Resp: 16 18 18 20   Height:  6\' 3"  (1.905 m)    Weight:  72.576 kg (160 lb)    SpO2: 100% 100% 95% 100%    Wt Readings from Last 3 Encounters:  03/23/16 72.576 kg (160 lb)  03/12/16 74.934 kg (165 lb 3.2 oz)  02/15/16 72.1 kg (158 lb 15.2 oz)    General:  Appears calm and  comfortable Eyes:  PERRL, EOMI, normal lids, iris ENT:  grossly normal hearing, lips & tongue Except dry oral mucosa Neck:  no LAD, masses or thyromegaly Cardiovascular:  RRR, no m/r/g. No LE edema. Cold extremities. Delayed cap refill. Respiratory:  CTA bilaterally, no w/r/r. Normal respiratory effort. Abdomen:  soft, ntnd Skin:  no rash or induration seen on limited exam Musculoskeletal:  grossly normal tone BUE/BLE Psychiatric:  grossly normal mood and affect, speech fluent and appropriate Neurologic:  CN 2-12 grossly intact, moves all extremities in coordinated fashion.          Labs on Admission:  Basic Metabolic Panel:  Recent Labs Lab 03/23/16 1652 03/23/16 1739 03/23/16 2115 03/24/16 0109  NA 139 144  --  141  K 4.4 4.2  --  3.7  CL 119* 118*  --  120*  CO2 11*  --   --  13*  GLUCOSE 160* 146*  --  286*  BUN 79* 88*  --  79*  CREATININE 4.13* 4.30*  --  4.09*  CALCIUM 8.0*  --   --  7.8*  MG  --   --  1.6*  --   PHOS  --   --  7.6*  --    Liver Function Tests:  Recent Labs Lab 03/23/16 1652  AST 21  ALT 22  ALKPHOS 88  BILITOT 0.6  PROT 7.0  ALBUMIN 3.6   No results for input(s): LIPASE, AMYLASE in the last 168 hours. No results for input(s): AMMONIA in the last 168 hours. CBC:  Recent Labs Lab 03/23/16 1739 03/23/16 2115 03/24/16 0109  WBC  --  6.2 6.0  NEUTROABS  --  4.1  --   HGB 7.8* 7.0* 6.9*  HCT 23.0* 20.1* 19.4*  MCV  --  89.3 86.6  PLT  --  244 248   Cardiac Enzymes: No results for input(s): CKTOTAL, CKMB, CKMBINDEX, TROPONINI in the last 168 hours.  BNP (last 3 results)  Recent Labs  02/01/16 0332 03/23/16 1652  BNP 792.4* 121.2*    ProBNP (last 3 results) No results for input(s): PROBNP in the last 8760 hours.   CREATININE: 4.09 mg/dL ABNORMAL (16/10/96 0454) Estimated creatinine clearance - 15.3 mL/min  CBG:  Recent Labs Lab 03/23/16 1811 03/23/16 2241  GLUCAP 139* 246*    Radiological Exams on  Admission: Dg Chest 2 View  03/23/2016  CLINICAL DATA:  Shortness of breath and weakness for 3 days EXAM: CHEST  2 VIEW COMPARISON:  03/07/2016 FINDINGS: Cardiac shadow is stable. The lungs are again well aerated. Stable right middle lobe opacity is noted. This is similar to that seen on the prior exam. No new focal infiltrate is seen. No sizable effusion is noted. IMPRESSION: Stable changes in the right lung base. Electronically Signed   By: Alcide Clever M.D.   On: 03/23/2016 17:46    EKG: Independently reviewed. Normal sinus rhythm. Mild prolonged QT.  Assessment/Plan Principal Problem:   Acute on chronic renal failure (HCC) Active Problems:   Anemia, chronic disease   Recurrent UTI   DM (diabetes mellitus) (HCC)   BPH (benign prostatic hyperplasia)   HTN (hypertension), benign   Hyperlipidemia    Acute on chronic renal failure Baseline creatinine around 3.8, creatinine on admission 4.3 Strict I's and O's Foley catheter  Metabolic acidosis Multifactorial infection, dehydration and renal function Spoke to Dr. Hyman Hopes with nephrology, nephrology will see patient in the morning Started on bicarbonate drip Repeat ABG in the morning Good fluid resuscitation, per floor nurse patient had over a liter out of fluid  Low magnesium Being replaced IV  Chronic A. fib Continue warfarin  UTI Started on cipro will continue Urine culture pending  Diabetes Sliding scale insulin Lantus 10 units daily  Hypertension Lopressor 50 mg by mouth twice a day  BPH Continue Avodart  Anemia This is a chronic problem for the patient. Likely due to being on anticoagulants and his kidney function. No acute bleeding reported. Occult stool is still pending. Patient get transfused 2 units of PRBCs  Code Status: full  DVT Prophylaxis: warfarin Family Communication: Wife at bedside Disposition Plan: Pending Improvement  Status: Tele Obs  Haydee Salter, MD Family Medicine [facility] [email or web address] Password [email or web address]

## 2016-03-24 NOTE — Progress Notes (Addendum)
PROGRESS NOTE    Paul Guerra  ZHY:[id number] DOB: December 30, 1934 DOA: 03/23/2016 PCP: Michele Mcalpine, MD  Brief Narrative:  Paul Guerra is a 81 y.o. male with past medical history significant for diabetes, hypertension, cardiac arrest and blood transfusion presents to the emergency room accompanied chief complaint weakness over the last 2 days. Patient does have a history of low oral intake of water.   Patient admitted with acute on chronic renal failure cr at 4, metabolic acidosis,   Assessment & Plan:   Principal Problem:   Acute on chronic renal failure (HCC) Active Problems:   Anemia, chronic disease   Recurrent UTI   DM (diabetes mellitus) (HCC)   BPH (benign prostatic hyperplasia)   HTN (hypertension), benign   Hyperlipidemia  Acute on chronic renal failure Baseline creatinine around 3.8, creatinine on admission 4.3 Strict I's and O's IV fluid with bicarb.  Renal consulted.  Check renal US>   Metabolic acidosis Multifactorial infection, dehydration and renal function Continue with bicarbonate drip Lactic acid normal.   Abdominal pain; \ Check KUB. Could be related to UTI.   Low magnesium replaced IV  Chronic A. fib Continue warfarin  UTI; UA with too numerous to count WBC.  Continue with cipro ,  Urine culture pending  Diabetes Sliding scale insulin Lantus 10 units daily  Hypertension Lopressor 50 mg by mouth twice a day  BPH Continue Avodart  Anemia This is a chronic problem for the patient. Likely due to being on anticoagulants and his kidney function. No acute bleeding reported. Occult stool is still pending. Getting  transfused 2 units of PRBCs    DVT prophylaxis: on coumadin Code Status: full code.  Family Communication: care discussed with patient.  Disposition Plan: remain inpatient for treatment of Renal failure.    Consultants:   Nephrology   Procedures:   none  Antimicrobials:   Ciprofloxacin 6-13   Subjective: He is  alert, following command. Report mild abdominal pain on palpation. Doesn't remember when he had last BM.   Objective: Filed Vitals:   03/24/16 0422 03/24/16 0441 03/24/16 0512 03/24/16 0714  BP: 143/61 139/72 147/59 158/64  Pulse: 69 76 70 70  Temp: 98.9 F (37.2 C) 98.4 F (36.9 C) 98.2 F (36.8 C) 98.5 F (36.9 C)  TempSrc: Oral Oral Oral Oral  Resp: 18 20 20 18   Height:      Weight:      SpO2: 95% 100% 100% 100%    Intake/Output Summary (Last 24 hours) at 03/24/16 0857 Last data filed at 03/24/16 0840  Gross per 24 hour  Intake    796 ml  Output   1800 ml  Net  -1004 ml   Filed Weights   03/23/16 1407 03/23/16 2154  Weight: 74.844 kg (165 lb) 72.576 kg (160 lb)    Examination:  General exam: Appears calm and comfortable  Respiratory system: Clear to auscultation. Respiratory effort normal. Cardiovascular system: S1 & S2 heard, RRR. No JVD, murmurs, rubs, gallops or clicks. No pedal edema. Gastrointestinal system: Abdomen is nondistended, soft and mild tenderness, supra pubic.  Central nervous system: Alert and oriented. No focal neurological deficits. Extremities: Symmetric 5 x 5 power. Skin: No rashes, lesions or ulcers Psychiatry: Judgement and insight appear normal. Mood & affect appropriate.     Data Reviewed: I have personally reviewed following labs and imaging studies  CBC:  Recent Labs Lab 03/23/16 1739 03/23/16 2115 03/24/16 0109  WBC  --  6.2 6.0  NEUTROABS  --  4.1  --   HGB 7.8* 7.0* 6.9*  HCT 23.0* 20.1* 19.4*  MCV  --  89.3 86.6  PLT  --  244 248   Basic Metabolic Panel:  Recent Labs Lab 03/23/16 1652 03/23/16 1739 03/23/16 2115 03/24/16 0109  NA 139 144  --  141  K 4.4 4.2  --  3.7  CL 119* 118*  --  120*  CO2 11*  --   --  13*  GLUCOSE 160* 146*  --  286*  BUN 79* 88*  --  79*  CREATININE 4.13* 4.30*  --  4.09*  CALCIUM 8.0*  --   --  7.8*  MG  --   --  1.6*  --   PHOS  --   --  7.6*  --    GFR: Estimated Creatinine  Clearance: 15.3 mL/min (by C-G formula based on Cr of 4.09). Liver Function Tests:  Recent Labs Lab 03/23/16 1652  AST 21  ALT 22  ALKPHOS 88  BILITOT 0.6  PROT 7.0  ALBUMIN 3.6   No results for input(s): LIPASE, AMYLASE in the last 168 hours. No results for input(s): AMMONIA in the last 168 hours. Coagulation Profile:  Recent Labs Lab 03/23/16 2115 03/24/16 0109  INR 2.62* 2.67*   Cardiac Enzymes: No results for input(s): CKTOTAL, CKMB, CKMBINDEX, TROPONINI in the last 168 hours. BNP (last 3 results) No results for input(s): PROBNP in the last 8760 hours. HbA1C: No results for input(s): HGBA1C in the last 72 hours. CBG:  Recent Labs Lab 03/23/16 1811 03/23/16 2241 03/24/16 0747  GLUCAP 139* 246* 290*   Lipid Profile: No results for input(s): CHOL, HDL, LDLCALC, TRIG, CHOLHDL, LDLDIRECT in the last 72 hours. Thyroid Function Tests: No results for input(s): TSH, T4TOTAL, FREET4, T3FREE, THYROIDAB in the last 72 hours. Anemia Panel: No results for input(s): VITAMINB12, FOLATE, FERRITIN, TIBC, IRON, RETICCTPCT in the last 72 hours. Sepsis Labs:  Recent Labs Lab 03/23/16 2219 03/24/16 0109  LATICACIDVEN 1.2 1.0    No results found for this or any previous visit (from the past 240 hour(s)).       Radiology Studies: Dg Chest 2 View  03/23/2016  CLINICAL DATA:  Shortness of breath and weakness for 3 days EXAM: CHEST  2 VIEW COMPARISON:  03/07/2016 FINDINGS: Cardiac shadow is stable. The lungs are again well aerated. Stable right middle lobe opacity is noted. This is similar to that seen on the prior exam. No new focal infiltrate is seen. No sizable effusion is noted. IMPRESSION: Stable changes in the right lung base. Electronically Signed   By: Alcide CleverMark  Lukens M.D.   On: 03/23/2016 17:46        Scheduled Meds: . amitriptyline  25 mg Oral QHS  . atorvastatin  20 mg Oral q1800  . ciprofloxacin  400 mg Intravenous Q24H  . dutasteride  0.5 mg Oral Daily  .  insulin aspart  0-15 Units Subcutaneous TID WC  . insulin glargine  10 Units Subcutaneous Daily  . magnesium sulfate 1 - 4 g bolus IVPB  2 g Intravenous Once  . metoprolol  50 mg Oral BID  . sodium chloride flush  3 mL Intravenous Q12H  . warfarin  2.5 mg Oral ONCE-1800  . Warfarin - Pharmacist Dosing Inpatient   Does not apply q1800   Continuous Infusions: . sodium chloride 75 mL/hr at 03/23/16 2305  .  sodium bicarbonate  infusion 1000 mL 100 mL/hr at 03/23/16 2200  Time spent: 35 minutes.     Alba Cory, MD [facility] Pager [phone]  If 7PM-7AM, please contact night-coverage [email or web address] Password [facility] 03/24/2016, 8:57 AM

## 2016-03-25 DIAGNOSIS — N184 Chronic kidney disease, stage 4 (severe): Secondary | ICD-10-CM | POA: Diagnosis not present

## 2016-03-25 DIAGNOSIS — N179 Acute kidney failure, unspecified: Secondary | ICD-10-CM

## 2016-03-25 DIAGNOSIS — N390 Urinary tract infection, site not specified: Secondary | ICD-10-CM | POA: Diagnosis not present

## 2016-03-25 DIAGNOSIS — E872 Acidosis: Secondary | ICD-10-CM | POA: Diagnosis not present

## 2016-03-25 DIAGNOSIS — I10 Essential (primary) hypertension: Secondary | ICD-10-CM | POA: Diagnosis not present

## 2016-03-25 DIAGNOSIS — N189 Chronic kidney disease, unspecified: Secondary | ICD-10-CM | POA: Diagnosis not present

## 2016-03-25 LAB — URINE CULTURE

## 2016-03-25 LAB — BASIC METABOLIC PANEL
ANION GAP: 10 (ref 5–15)
BUN: 76 mg/dL — ABNORMAL HIGH (ref 6–20)
CALCIUM: 8 mg/dL — AB (ref 8.9–10.3)
CO2: 17 mmol/L — ABNORMAL LOW (ref 22–32)
Chloride: 119 mmol/L — ABNORMAL HIGH (ref 101–111)
Creatinine, Ser: 3.97 mg/dL — ABNORMAL HIGH (ref 0.61–1.24)
GFR, EST AFRICAN AMERICAN: 15 mL/min — AB (ref >60)
GFR, EST NON AFRICAN AMERICAN: 13 mL/min — AB (ref >60)
GLUCOSE: 209 mg/dL — AB (ref 65–99)
POTASSIUM: 2.9 mmol/L — AB (ref 3.5–5.1)
SODIUM: 146 mmol/L — AB (ref 135–145)

## 2016-03-25 LAB — CBC
HCT: 25.5 % — ABNORMAL LOW (ref 39.0–52.0)
Hemoglobin: 8.9 g/dL — ABNORMAL LOW (ref 13.0–17.0)
MCH: 29.1 pg (ref 26.0–34.0)
MCHC: 34.9 g/dL (ref 30.0–36.0)
MCV: 83.3 fL (ref 78.0–100.0)
PLATELETS: 229 10*3/uL (ref 150–400)
RBC: 3.06 MIL/uL — AB (ref 4.22–5.81)
RDW: 17.2 % — AB (ref 11.5–15.5)
WBC: 6.4 10*3/uL (ref 4.0–10.5)

## 2016-03-25 LAB — GLUCOSE, CAPILLARY
GLUCOSE-CAPILLARY: 264 mg/dL — AB (ref 65–99)
GLUCOSE-CAPILLARY: 186 mg/dL — AB (ref 65–99)
GLUCOSE-CAPILLARY: 282 mg/dL — AB (ref 65–99)
GLUCOSE-CAPILLARY: 249 mg/dL — AB (ref 65–99)

## 2016-03-25 LAB — TYPE AND SCREEN
ABO/RH(D): O POS
Antibody Screen: NEGATIVE
UNIT DIVISION: 0
Unit division: 0

## 2016-03-25 LAB — PROTIME-INR
INR: 2.41 — AB (ref 0.00–1.49)
Prothrombin Time: 25.2 seconds — ABNORMAL HIGH (ref 11.6–15.2)

## 2016-03-25 MED ORDER — WARFARIN SODIUM 2.5 MG PO TABS
2.5000 mg | ORAL_TABLET | Freq: Once | ORAL | Status: AC
  Administered 2016-03-25: 2.5 mg via ORAL
  Filled 2016-03-25: qty 1

## 2016-03-25 MED ORDER — POTASSIUM CHLORIDE CRYS ER 20 MEQ PO TBCR
40.0000 meq | EXTENDED_RELEASE_TABLET | ORAL | Status: AC
  Administered 2016-03-25 (×2): 40 meq via ORAL
  Filled 2016-03-25 (×2): qty 2

## 2016-03-25 NOTE — Progress Notes (Signed)
[facility] Progress Note   Subjective: no c/o.   Filed Vitals:   03/24/16 2151 03/25/16 0608 03/25/16 1102 03/25/16 1553  BP: 173/70 163/66 110/71 129/96  Pulse: 69 61 107 127  Temp: 98.9 F (37.2 C) 97.8 F (36.6 C)  98.4 F (36.9 C)  TempSrc: Oral Oral  Oral  Resp: 18 18  18   Height:      Weight:      SpO2: 100% 100%  100%    Inpatient medications: . amitriptyline  25 mg Oral QHS  . atorvastatin  20 mg Oral q1800  . ciprofloxacin  400 mg Intravenous Q24H  . dutasteride  0.5 mg Oral Daily  . insulin aspart  0-15 Units Subcutaneous TID WC  . insulin glargine  10 Units Subcutaneous Daily  . metoprolol  50 mg Oral BID  . potassium chloride  40 mEq Oral Q4H  . sodium chloride flush  3 mL Intravenous Q12H  . warfarin  2.5 mg Oral ONCE-1800  . Warfarin - Pharmacist Dosing Inpatient   Does not apply q1800   .  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  Poor dialysis candidate due to age, comorbidities and frailty.  Family and patient have already arranged for home hospice.  I told daughter Jakarie 3 mos but could be a matter of weeks as well.  Have d/w primary, no further suggestions, try to simplify meds at all if possible, will sign off 2 Volume stable 3 HTN 4 R hydro sp stenting April 2017 5 Chron afib 6 Anemia to get prbc's 7 Met  acidosis - dc'd IVF's, would not try to treat this in hospice setting    Plan - as above   Kelly Splinter MD [facility] pager [phone]    cell [phone] 03/25/2016, 4:01 PM    Recent Labs Lab 03/23/16 1652 03/23/16 1739 03/23/16 2115 03/24/16 0109 03/25/16 0448  NA 139 144  --  141 146*  K 4.4 4.2  --  3.7 2.9*  CL 119* 118*  --  120* 119*  CO2 11*  --   --  13* 17*  GLUCOSE 160* 146*  --  286* 209*  BUN 79* 88*  --  79* 76*  CREATININE 4.13* 4.30*  --  4.09* 3.97*  CALCIUM 8.0*  --   --  7.8* 8.0*  PHOS  --   --  7.6*  --   --     Recent Labs Lab 03/23/16 1652  AST 21  ALT 22  ALKPHOS 88  BILITOT 0.6  PROT 7.0  ALBUMIN 3.6    Recent Labs Lab 03/23/16 2115 03/24/16 0109 03/24/16 2054 03/25/16 0448  WBC 6.2 6.0  --  6.4  NEUTROABS 4.1  --   --   --   HGB 7.0* 6.9* 9.5* 8.9*  HCT 20.1* 19.4* 26.4* 25.5*  MCV 89.3 86.6  --  83.3  PLT 244 248  --  229   Iron/TIBC/Ferritin/ %Sat    Component Value Date/Time   IRON 102 03/12/2016 1159   TIBC 245* 01/17/2016 1618   FERRITIN 464.5* 03/12/2016 1159   IRONPCTSAT 32.1 03/12/2016 1159

## 2016-03-25 NOTE — Progress Notes (Addendum)
ANTICOAGULATION CONSULT NOTE   Pharmacy Consult for Warfarin Indication: Atrial Fibrillation  Allergies  Allergen Reactions  . Nitroglycerin Anaphylaxis  . Penicillins Anaphylaxis, Hives and Shortness Of Breath    Has patient had a PCN reaction causing immediate rash, facial/tongue/throat swelling, SOB or lightheadedness with hypotension: Yes Has patient had a PCN reaction causing severe rash involving mucus membranes or skin necrosis: Yes Has patient had a PCN reaction that required hospitalization Yes Has patient had a PCN reaction occurring within the last 10 years: No If all of the above answers are "NO", then may proceed with Cephalosporin use.   . Sudafed  [Pseudoephedrine Hcl] Hives  . Tetracycline Shortness Of Breath    Other reaction(s): SHORTNESS OF BREATH  . Vancomycin Hives  . Doxycycline Rash    Other reaction(s): RASH  . Sulfa Antibiotics Rash    Patient Measurements: Height: 6\' 3"  (190.5 cm) Weight: 160 lb (72.576 kg) IBW/kg (Calculated) : 84.5 Heparin Dosing Weight:   Vital Signs: Temp: 97.8 F (36.6 C) (06/14 0608) Temp Source: Oral (06/14 0608) BP: 163/66 mmHg (06/14 0608) Pulse Rate: 61 (06/14 0608)  Labs:  Recent Labs  03/23/16 1739 03/23/16 2115 03/24/16 0109 03/24/16 2054 03/25/16 0448  HGB 7.8* 7.0* 6.9* 9.5* 8.9*  HCT 23.0* 20.1* 19.4* 26.4* 25.5*  PLT  --  244 248  --  229  LABPROT  --  27.6* 28.0*  --  25.2*  INR  --  2.62* 2.67*  --  2.41*  CREATININE 4.30*  --  4.09*  --  3.97*    Estimated Creatinine Clearance: 15.7 mL/min (by C-G formula based on Cr of 3.97).  Assessment: 80 yoM on chronic anticoagulation for atrial fibrillation, [facility] weakness [facility] 3 days.  Pharmacy consulted to resume warfarin dosing inpatient.  INR therapeutic at admission.  Noted low and decreasing hemoglobin, plts WNL.    Medications:  Warfarin 2.5mg  po daily except 5mg  po on Tuesdays, LD 6/12  Today, 03/25/2016  INR remains  therapeutic, warfarin dose empirically reduced 6/13PM fo rdrug interaction with ciprofloxacin  CBC: Hgb low but improved s/p PRBC 6/13 (decreasing from initial response), pltc WNL  CKD - may need HD (renal to d/w family)  Diet: heart healthy/carb modified  Drug-drug interactions: ciprofloxacin may enhance warfarin's effects and increase INR  Goal of Therapy:  INR 2-3 Monitor platelets by anticoagulation protocol: Yes   Plan:   Warfarin 2.5mg  PO x 1 tonight  Daily INR  Monitor for bleeding, Hgb trend  Juliette Alcideustin Lashina Milles, PharmD, BCPS.   Pager: [phone] 03/25/2016 8:50 AM

## 2016-03-25 NOTE — Evaluation (Addendum)
Physical Therapy Evaluation Patient Details Name: Paul Guerra MRN: [id number] DOB: [birth year] Today's Date: 03/25/2016   History of Present Illness  Paul Guerra is a 81 y.o. male with past medical history significant for diabetes, hypertension, cardiac arrest and blood transfusion presents to the emergency room 03/23/16 with weakness. in [facility]  2 months ago with VDRF.  Found to have worsening renal function.. No HD planned  . plans Dc to home with Hospice.  Clinical Impression  The patient is pleasant and  Wanted to sit at bed edge.  He also stood with assist at Rw. Noted plans for DC to home with Hospice services. Pt admitted with above diagnosis. Pt currently with functional limitations due to the deficits listed below (see PT Problem List).  Pt will benefit from skilled PT to increase their independence and safety with mobility to allow discharge to the venue listed below.    Limited at this time with noted worsening renal function.  HR in 120-130's with activity. Sats 95% on 2 liters  Follow Up Recommendations Home health PT (per daughter , currently has HHPT)    Equipment Recommendations  Hospital bed;Wheelchair (measurements PT) ([facility] porviding DME for patiient)    Recommendations for Other Services       Precautions / Restrictions Precautions Precautions: Fall      Mobility  Bed Mobility Overal bed mobility: Needs Assistance Bed Mobility: Supine to Sit;Sit to Supine     Supine to sit: Mod assist Sit to supine: Mod assist      Transfers Overall transfer level: Needs assistance Equipment used: Rolling walker (2 wheeled) Transfers: Sit to/from Stand Sit to Stand: Mod assist;+2 physical assistance;+2 safety/equipment         General transfer comment: stood from bed  at [facility]. took 4 side steps along the bed. returned to supine  Ambulation/Gait                Stairs            Wheelchair Mobility    Modified Rankin (Stroke Patients Only)        Balance                                             Pertinent Vitals/Pain Pain Assessment: No/denies pain    Home Living Family/patient expects to be discharged to:: Private residence Living Arrangements: Children Available Help at Discharge: Available PRN/intermittently Type of Home: Mobile home Home Access: Stairs to enter     Home Layout: One level Home Equipment: Environmental consultant - 2 wheels;Wheelchair - manual      Prior Function Level of Independence: Needs assistance   Gait / Transfers Assistance Needed: amb with RW  ADL's / Homemaking Assistance Needed: assist w/ ADL's        Hand Dominance        Extremity/Trunk Assessment   Upper Extremity Assessment: Generalized weakness           Lower Extremity Assessment: Generalized weakness         Communication      Cognition Arousal/Alertness: Awake/alert Behavior During Therapy: WFL for tasks assessed/performed Overall Cognitive Status: History of cognitive impairments - at baseline                      General Comments      Exercises  Assessment/Plan    PT Assessment Patient needs continued PT services  PT Diagnosis Difficulty walking;Generalized weakness   PT Problem List Decreased strength;Decreased activity tolerance  PT Treatment Interventions Functional mobility training;Therapeutic activities   PT Goals (Current goals can be found in the Care Plan section) Acute Rehab PT Goals Patient Stated Goal: to get up and go PT Goal Formulation: With patient/family Time For Goal Achievement: 04/01/16 Potential to Achieve Goals: Fair    Frequency Min 2X/week   Barriers to discharge        Co-evaluation               End of Session   Activity Tolerance: Patient tolerated treatment well Patient left: in bed;with call bell/phone within reach;with bed alarm set;with family/visitor present Nurse Communication: Mobility status    Functional Assessment Tool  Used: clinical judgement Functional Limitation: Mobility: Walking and moving around Mobility: Walking and Moving Around Current Status ([id number]): At least 60 percent but less than 80 percent impaired, limited or restricted Mobility: Walking and Moving Around Goal Status [phone]): At least 20 percent but less than 40 percent impaired, limited or restricted    Time: 1400-1415 PT Time Calculation (min) (ACUTE ONLY): 15 min   Charges:   PT Evaluation $PT Eval Low Complexity: 1 Procedure     PT G Codes:   PT G-Codes **NOT FOR INPATIENT CLASS** Functional Assessment Tool Used: clinical judgement Functional Limitation: Mobility: Walking and moving around Mobility: Walking and Moving Around Current Status ([id number]): At least 60 percent but less than 80 percent impaired, limited or restricted Mobility: Walking and Moving Around Goal Status [phone]): At least 20 percent but less than 40 percent impaired, limited or restricted    Rada HayHill, Rainier Feuerborn Elizabeth 03/25/2016, 4:52 PM

## 2016-03-25 NOTE — Telephone Encounter (Signed)
One more question Almyra FreeLibby,  How soon would he need to be seen? If sooner than September, I'll need to make other arrangements for the patient. I just want to have the facts ahead of time

## 2016-03-25 NOTE — Progress Notes (Signed)
Spoke in length to pt, daughter and son at beside concerning HH needs. Daughter Arline AspCindy states that pt has asked for Home Hospice. [facility]. Pt will continue with [facility] at discharge. Pt will need O2 orders at discharge and EMS for transportation.  [name] and pt asked for hospital bed, bedside table, BSC and O2 at discharge. [name]  [phone], with [facility] was called, [facility], if pt do not go with HD then pt can go home with Hospice. Will continue to follow.  [name], will also explain again Hospice to the pt and family.

## 2016-03-25 NOTE — Progress Notes (Signed)
PROGRESS NOTE    Paul Guerra  UJW:[id number] DOB: 01/12/1935 DOA: 03/23/2016 PCP: Michele Mcalpine, MD  Brief Narrative:  Paul Guerra is a 81 y.o. male with past medical history significant for diabetes, hypertension, cardiac arrest and blood transfusion presents to the emergency room accompanied chief complaint weakness over the last 2 days. Patient does have a history of low oral intake of water.   Patient admitted with acute on chronic renal failure cr at 4, metabolic acidosis,   Assessment & Plan:   Principal Problem:   Acute on chronic renal failure (HCC) Active Problems:   Anemia, chronic disease   Recurrent UTI   DM (diabetes mellitus) (HCC)   BPH (benign prostatic hyperplasia)   HTN (hypertension), benign   Hyperlipidemia  Acute on chronic renal failure -[name] having a history of chronic kidney disease with baseline creatinine near 3.6 -He was admitted to the medicine service on 03/24/2016 presenting with generalized weakness and functional decline. -Initial lab work revealed a metabolic acidosis with bicarbonate of 11. He was also found to have acute renal failure with creatinine of 4.3 -He was started on a bicarbonate drip -Nephrology consulted. He is undergone renal biopsy showing severe ATN. -There is concern about further deterioration in his kidney function and becoming uremic [place] the possibility of hemodialysis will be discussed today with family members. -Lab work on 03/25/2016 showing a bicarbonate of 17  Metabolic acidosis -Suspect related to acute on chronic renal failure -Lab work showing improvement to bicarbonate at 17, increased from 11 -Remains on bicarbonate drip  Abdominal pain;  -Likely secondary to constipation as abdominal film performed on 03/24/2016 showed a large amount of stool throughout colon  Hypokalemia -Lab work showing potassium of 2.9 -Will replace with KDur  Low magnesium -replaced IV -Repeat magnesium level  Chronic A.  fib Continue warfarin, pharmacy consulted for warfarin management  UTI; UA with too numerous to count WBC.  Continue with cipro ,  Urine culture pending  Diabetes Sliding scale insulin Lantus 10 units daily  Hypertension Lopressor 50 mg by mouth twice a day  BPH Continue Avodart  Anemia This is a chronic problem for the patient. Likely due to being on anticoagulants and his kidney function. No acute bleeding reported. Occult stool is still pending. Getting  transfused 2 units of PRBCs    DVT prophylaxis: on coumadin Code Status: full code.  Family Communication: care discussed with patient.  Disposition Plan: remain inpatient for treatment of Renal failure.    Consultants:   Nephrology   Procedures:   none  Antimicrobials:   Ciprofloxacin 6-13   Subjective: States feeling a little better today, awake and alert  Objective: Filed Vitals:   03/24/16 1402 03/24/16 2151 03/25/16 0608 03/25/16 1102  BP: 146/73 173/70 163/66 110/71  Pulse: 60 69 61 107  Temp: 98.5 F (36.9 C) 98.9 F (37.2 C) 97.8 F (36.6 C)   TempSrc: Axillary Oral Oral   Resp: 18 18 18    Height:      Weight:      SpO2: 100% 100% 100%     Intake/Output Summary (Last 24 hours) at 03/25/16 1343 Last data filed at 03/25/16 1153  Gross per 24 hour  Intake   2855 ml  Output   3800 ml  Net   -945 ml   Filed Weights   03/23/16 1407 03/23/16 2154  Weight: 74.844 kg (165 lb) 72.576 kg (160 lb)    Examination:  General exam: Appears calm and comfortable  Respiratory system: Clear to auscultation. Respiratory effort normal. Cardiovascular system: S1 & S2 heard, RRR. No JVD, murmurs, rubs, gallops or clicks. No pedal edema. Gastrointestinal system: Abdomen is nondistended, soft and mild tenderness, supra pubic.  Central nervous system: Alert and oriented. No focal neurological deficits. Extremities: Symmetric 5 x 5 power. Skin: No rashes, lesions or ulcers Psychiatry: Judgement and  insight appear normal. Mood & affect appropriate.     Data Reviewed: I have personally reviewed following labs and imaging studies  CBC:  Recent Labs Lab 03/23/16 1739 03/23/16 2115 03/24/16 0109 03/24/16 2054 03/25/16 0448  WBC  --  6.2 6.0  --  6.4  NEUTROABS  --  4.1  --   --   --   HGB 7.8* 7.0* 6.9* 9.5* 8.9*  HCT 23.0* 20.1* 19.4* 26.4* 25.5*  MCV  --  89.3 86.6  --  83.3  PLT  --  244 248  --  229   Basic Metabolic Panel:  Recent Labs Lab 03/23/16 1652 03/23/16 1739 03/23/16 2115 03/24/16 0109 03/25/16 0448  NA 139 144  --  141 146*  K 4.4 4.2  --  3.7 2.9*  CL 119* 118*  --  120* 119*  CO2 11*  --   --  13* 17*  GLUCOSE 160* 146*  --  286* 209*  BUN 79* 88*  --  79* 76*  CREATININE 4.13* 4.30*  --  4.09* 3.97*  CALCIUM 8.0*  --   --  7.8* 8.0*  MG  --   --  1.6*  --   --   PHOS  --   --  7.6*  --   --    GFR: Estimated Creatinine Clearance: 15.7 mL/min (by C-G formula based on Cr of 3.97). Liver Function Tests:  Recent Labs Lab 03/23/16 1652  AST 21  ALT 22  ALKPHOS 88  BILITOT 0.6  PROT 7.0  ALBUMIN 3.6   No results for input(s): LIPASE, AMYLASE in the last 168 hours. No results for input(s): AMMONIA in the last 168 hours. Coagulation Profile:  Recent Labs Lab 03/23/16 2115 03/24/16 0109 03/25/16 0448  INR 2.62* 2.67* 2.41*   Cardiac Enzymes: No results for input(s): CKTOTAL, CKMB, CKMBINDEX, TROPONINI in the last 168 hours. BNP (last 3 results) No results for input(s): PROBNP in the last 8760 hours. HbA1C: No results for input(s): HGBA1C in the last 72 hours. CBG:  Recent Labs Lab 03/24/16 1141 03/24/16 1651 03/24/16 2153 03/25/16 0726 03/25/16 1150  GLUCAP 222* 73 199* 264* 186*   Lipid Profile: No results for input(s): CHOL, HDL, LDLCALC, TRIG, CHOLHDL, LDLDIRECT in the last 72 hours. Thyroid Function Tests: No results for input(s): TSH, T4TOTAL, FREET4, T3FREE, THYROIDAB in the last 72 hours. Anemia Panel: No  results for input(s): VITAMINB12, FOLATE, FERRITIN, TIBC, IRON, RETICCTPCT in the last 72 hours. Sepsis Labs:  Recent Labs Lab 03/23/16 2219 03/24/16 0109  LATICACIDVEN 1.2 1.0    Recent Results (from the past 240 hour(s))  Urine culture     Status: Abnormal   Collection Time: 03/23/16  4:35 PM  Result Value Ref Range Status   Specimen Description URINE, RANDOM  Final   Special Requests NONE  Final   Culture MULTIPLE SPECIES PRESENT, SUGGEST RECOLLECTION (A)  Final   Report Status 03/25/2016 FINAL  Final  Culture, blood (routine x 2)     Status: None (Preliminary result)   Collection Time: 03/23/16 10:20 PM  Result Value Ref Range Status   Specimen  Description BLOOD RIGHT HAND  Final   Special Requests AEROBIC BOTTLE ONLY 6CC  Final   Culture   Final    NO GROWTH < 24 HOURS Performed at [facility]    Report Status PENDING  Incomplete  Culture, blood (routine x 2)     Status: None (Preliminary result)   Collection Time: 03/23/16 10:25 PM  Result Value Ref Range Status   Specimen Description BLOOD LEFT HAND  Final   Special Requests IN PEDIATRIC BOTTLE 3CC  Final   Culture   Final    NO GROWTH < 24 HOURS Performed at [facility]    Report Status PENDING  Incomplete         Radiology Studies: Dg Chest 2 View  03/23/2016  CLINICAL DATA:  Shortness of breath and weakness for 3 days EXAM: CHEST  2 VIEW COMPARISON:  03/07/2016 FINDINGS: Cardiac shadow is stable. The lungs are again well aerated. Stable right middle lobe opacity is noted. This is similar to that seen on the prior exam. No new focal infiltrate is seen. No sizable effusion is noted. IMPRESSION: Stable changes in the right lung base. Electronically Signed   By: Alcide Clever M.D.   On: 03/23/2016 17:46   Dg Abd 1 View  03/24/2016  CLINICAL DATA:  Acute generalized abdominal pain.  Constipation. EXAM: ABDOMEN - 1 VIEW COMPARISON:  February 01, 2016. FINDINGS: Large amount of stool is noted throughout  the colon. No abnormal bowel dilatation is noted. Status post cholecystectomy. IMPRESSION: Large amount of stool seen throughout the colon suggesting constipation. No abnormal bowel dilatation is noted. Electronically Signed   By: Lupita Raider, M.D.   On: 03/24/2016 10:00   US Renal  03/24/2016  CLINICAL DATA:  Renal failure.  Diabetes, hypertension. EXAM: RENAL / URINARY TRACT ULTRASOUND COMPLETE COMPARISON:  None. FINDINGS: Right Kidney: Length: 10.1 cm. Hypoechoic mass within the inferior pole of the right kidney measures 1.2 x 1 x 0.7 cm, too small to definitively characterize, probable cyst. No renal stone or hydronephrosis appreciated. No perinephric fluid. Renal cortex appears grossly normal in thickness and echogenicity. Left Kidney: Length: 9.3 cm. 7 mm echogenic focus within the left renal pelvis is suspicious for nonobstructing stone. No hydronephrosis. No perinephric fluid. Renal cortex appears grossly normal in thickness and echogenicity. Bladder: Layering debris within the bladder.  Bladder otherwise unremarkable. IMPRESSION: 1. Nonobstructing left renal stone, measuring approximately 7 mm, as also seen on earlier abdomen CT dated 01/16/2016. No associated hydronephrosis. 2. Probable small cyst within the lower pole of the right kidney, too small to definitively characterize, measuring 1.2 x 1 x 0.7 cm, also described on ultrasound report of 02/03/2016. Electronically Signed   By: Bary Richard M.D.   On: 03/24/2016 17:28        Scheduled Meds: . amitriptyline  25 mg Oral QHS  . atorvastatin  20 mg Oral q1800  . ciprofloxacin  400 mg Intravenous Q24H  . dutasteride  0.5 mg Oral Daily  . insulin aspart  0-15 Units Subcutaneous TID WC  . insulin glargine  10 Units Subcutaneous Daily  . metoprolol  50 mg Oral BID  . sodium chloride flush  3 mL Intravenous Q12H  . warfarin  2.5 mg Oral ONCE-1800  . Warfarin - Pharmacist Dosing Inpatient   Does not apply q1800   Continuous  Infusions: .  sodium bicarbonate  infusion 1000 mL 100 mL/hr at 03/23/16 2200        Time  spent: 35 minutes.     Jeralyn Bennett, MD [facility] Pager [phone]  If 7PM-7AM, please contact night-coverage [email or web address] Password [email or web address] 03/25/2016, 1:43 PM

## 2016-03-25 NOTE — Telephone Encounter (Signed)
Well not sure this pt has gone back to hospital and has been admitted

## 2016-03-26 ENCOUNTER — Telehealth: Payer: Self-pay | Admitting: Pulmonary Disease

## 2016-03-26 DIAGNOSIS — N179 Acute kidney failure, unspecified: Secondary | ICD-10-CM | POA: Diagnosis not present

## 2016-03-26 DIAGNOSIS — I10 Essential (primary) hypertension: Secondary | ICD-10-CM

## 2016-03-26 DIAGNOSIS — N184 Chronic kidney disease, stage 4 (severe): Secondary | ICD-10-CM

## 2016-03-26 DIAGNOSIS — N390 Urinary tract infection, site not specified: Secondary | ICD-10-CM | POA: Diagnosis not present

## 2016-03-26 LAB — BASIC METABOLIC PANEL
ANION GAP: 9 (ref 5–15)
BUN: 69 mg/dL — ABNORMAL HIGH (ref 6–20)
CALCIUM: 7.9 mg/dL — AB (ref 8.9–10.3)
CO2: 21 mmol/L — AB (ref 22–32)
CREATININE: 3.89 mg/dL — AB (ref 0.61–1.24)
Chloride: 114 mmol/L — ABNORMAL HIGH (ref 101–111)
GFR calc Af Amer: 16 mL/min — ABNORMAL LOW (ref >60)
GFR calc non Af Amer: 14 mL/min — ABNORMAL LOW (ref >60)
GLUCOSE: 143 mg/dL — AB (ref 65–99)
Potassium: 3.3 mmol/L — ABNORMAL LOW (ref 3.5–5.1)
Sodium: 144 mmol/L (ref 135–145)

## 2016-03-26 LAB — GLUCOSE, CAPILLARY
GLUCOSE-CAPILLARY: 284 mg/dL — AB (ref 65–99)
Glucose-Capillary: 146 mg/dL — ABNORMAL HIGH (ref 65–99)
Glucose-Capillary: 136 mg/dL — ABNORMAL HIGH (ref 65–99)

## 2016-03-26 LAB — CBC
HCT: 25.2 % — ABNORMAL LOW (ref 39.0–52.0)
HEMOGLOBIN: 8.9 g/dL — AB (ref 13.0–17.0)
MCH: 30.5 pg (ref 26.0–34.0)
MCHC: 35.3 g/dL (ref 30.0–36.0)
MCV: 86.3 fL (ref 78.0–100.0)
Platelets: 226 10*3/uL (ref 150–400)
RBC: 2.92 MIL/uL — ABNORMAL LOW (ref 4.22–5.81)
RDW: 17.2 % — ABNORMAL HIGH (ref 11.5–15.5)
WBC: 6 10*3/uL (ref 4.0–10.5)

## 2016-03-26 LAB — MAGNESIUM: Magnesium: 1.9 mg/dL (ref 1.7–2.4)

## 2016-03-26 LAB — PROTIME-INR
INR: 2.55 — AB (ref 0.00–1.49)
Prothrombin Time: 26.3 seconds — ABNORMAL HIGH (ref 11.6–15.2)

## 2016-03-26 MED ORDER — BISACODYL 5 MG PO TBEC: 5.0000 mg | DELAYED_RELEASE_TABLET | Freq: Every day | ORAL | Status: DC | PRN

## 2016-03-26 MED ORDER — POLYETHYLENE GLYCOL 3350 17 G PO PACK
17.0000 g | PACK | Freq: Every day | ORAL | Status: DC
Start: 2016-03-26 — End: 2016-03-26

## 2016-03-26 MED ORDER — CIPROFLOXACIN HCL 250 MG PO TABS: 250.0000 mg | ORAL_TABLET | Freq: Two times a day (BID) | ORAL | Status: AC

## 2016-03-26 MED ORDER — POLYETHYLENE GLYCOL 3350 17 G PO PACK: 17.0000 g | PACK | Freq: Every day | ORAL | Status: AC

## 2016-03-26 MED ORDER — MILK AND MOLASSES ENEMA
1.0000 | Freq: Once | RECTAL | Status: AC
  Administered 2016-03-26: 250 mL via RECTAL
  Filled 2016-03-26: qty 250

## 2016-03-26 MED ORDER — WARFARIN SODIUM 2.5 MG PO TABS: ORAL_TABLET | ORAL | Status: AC

## 2016-03-26 MED ORDER — BISACODYL 5 MG PO TBEC: 5.0000 mg | DELAYED_RELEASE_TABLET | Freq: Every day | ORAL | Status: AC | PRN

## 2016-03-26 MED ORDER — TRAZODONE HCL 50 MG PO TABS: 25.0000 mg | ORAL_TABLET | Freq: Every evening | ORAL | Status: AC | PRN

## 2016-03-26 MED ORDER — FLEET ENEMA 7-19 GM/118ML RE ENEM
1.0000 | ENEMA | Freq: Once | RECTAL | Status: AC
  Administered 2016-03-26: 1 via RECTAL
  Filled 2016-03-26: qty 1

## 2016-03-26 NOTE — Telephone Encounter (Signed)
lmtcb X1 for [place] with Hospice

## 2016-03-26 NOTE — Discharge Summary (Addendum)
Physician Discharge Summary  Paul Guerra ZOX:[id number] DOB: 1934/10/21 DOA: 03/23/2016  PCP: Michele Mcalpine, MD  Admit date: 03/23/2016 Discharge date: 03/26/2016  Time spent: 35 minutes  Recommendations for Outpatient Follow-up:  1. Mr Paul Guerra having a history of end-stage renal disease, seen by Dr Arlean Hopping of Nephrology who did feel he was a candidate for hemodialysis  2. Mr Paul Guerra was discharged home with hospice services   Discharge Diagnoses:  Principal Problem:   Acute on chronic renal failure [facility]) Active Problems:   Anemia, chronic disease   Recurrent UTI   DM (diabetes mellitus) (HCC)   BPH (benign prostatic hyperplasia)   HTN (hypertension), benign   Hyperlipidemia   Discharge Condition: Stable for Transport  Diet recommendation: As tolerated  Filed Weights   03/23/16 1407 03/23/16 2154  Weight: 74.844 kg (165 lb) 72.576 kg (160 lb)    History of present illness:  Paul Guerra is a 81 y.o. male with past medical history significant for diabetes, hypertension, cardiac arrest and blood transfusion presents to the emergency room accompanied chief complaint weakness. Wife and patient state the patient has been weak over the last 2 days. Worse the day he presented to the emergency room the day prior. Patient states that other than feeling weak and have really any symptoms. Patient does have a history of low oral intake of water. Patient denies any dysuria, hematuria, urinary frequency.  Hospital Course:   Acute on chronic renal failure -Mr Paul Guerra having a history of chronic kidney disease with baseline creatinine near 3.6 -He was admitted to the medicine service on 03/24/2016 presenting with generalized weakness and functional decline. -Initial lab work revealed a metabolic acidosis with bicarbonate of 11. He was also found to have acute renal failure with creatinine of 4.3 -He was started on a bicarbonate drip -Nephrology consulted. He is undergone renal biopsy showing severe  ATN. -There is concern about further deterioration in his kidney function and becoming uremic  -On 03/26/2016 Dr Arlean Hopping of Nephrology spoke to family members regarding mR Paul Guerra's renal deterioration. He explained that he was not a good candidate for hemodialysis, having multiple comorbidities, frailty, and functional status. Family elected to take him home with [facility] providing comfort care. I spoke to family members on this date and reaffrimed Mr Paul Guerra's medical goals of care.    Metabolic acidosis -Suspect related to acute on chronic renal failure -Lab work showing improvement to bicarbonate at 17, increased from 11 -On 03/26/2016 bicarb improving to 21  Abdominal pain;  -Likely secondary to constipation as abdominal film performed on 03/24/2016 showed a large amount of stool throughout colon -Placed on bowel regimen  Hypokalemia -Lab work showing potassium of 2.9 -He was replaced with Kdur  Low magnesium -replaced IV -Repeat magnesium level 1.9   Chronic A. fib Discharged on warfarin, hospice with draw PT/INR  UTI; UA with too numerous to count WBC.  Discharged on Cipro  Diabetes Sliding scale insulin Lantus 10 units daily  Hypertension Lopressor 50 mg by mouth twice a day  Anemia This is a chronic problem for the patient. Likely due to being on anticoagulants and his kidney function. No acute bleeding reported. Occult stool is still pending. Getting transfused 2 units of PRBCs  Consultations:  Nephrology  Discharge Exam: Filed Vitals:   03/25/16 2151 03/26/16 0527  BP: 162/62 137/62  Pulse: 66 57  Temp: 98 F (36.7 C) 97.5 F (36.4 C)  Resp: 20 20    General exam: Appears calm and  comfortable  Respiratory system: Clear to auscultation. Respiratory effort normal. Cardiovascular system: S1 & S2 heard, RRR. No JVD, murmurs, rubs, gallops or clicks. No pedal edema. Gastrointestinal system: Abdomen is nondistended, soft and mild tenderness, supra pubic.   Central nervous system: Alert and oriented. No focal neurological deficits. Extremities: Symmetric 5 x 5 power. Skin: No rashes, lesions or ulcers Psychiatry: Judgement and insight appear normal. Mood & affect appropriate.   Discharge Instructions   Discharge Instructions    Call MD for:  difficulty breathing, headache or visual disturbances    Complete by:  As directed      Call MD for:  extreme fatigue    Complete by:  As directed      Call MD for:  hives    Complete by:  As directed      Call MD for:  persistant dizziness or light-headedness    Complete by:  As directed      Call MD for:  persistant nausea and vomiting    Complete by:  As directed      Call MD for:  redness, tenderness, or signs of infection (pain, swelling, redness, odor or green/yellow discharge around incision site)    Complete by:  As directed      Call MD for:  severe uncontrolled pain    Complete by:  As directed      Call MD for:  temperature >100.4    Complete by:  As directed      Call MD for:    Complete by:  As directed      Diet - low sodium heart healthy    Complete by:  As directed      Increase activity slowly    Complete by:  As directed           Current Discharge Medication List    START taking these medications   Details  bisacodyl (DULCOLAX) 5 MG EC tablet Take 1 tablet (5 mg total) by mouth daily as needed for moderate constipation. Qty: 30 tablet, Refills: 0    ciprofloxacin (CIPRO) 250 MG tablet Take 1 tablet (250 mg total) by mouth 2 (two) times daily. Qty: 6 tablet, Refills: 0    polyethylene glycol (MIRALAX / GLYCOLAX) packet Take 17 g by mouth daily. Qty: 14 each, Refills: 0    traZODone (DESYREL) 50 MG tablet Take 0.5 tablets (25 mg total) by mouth at bedtime as needed for sleep. Qty: 30 tablet, Refills: 1      CONTINUE these medications which have CHANGED   Details  warfarin (COUMADIN) 2.5 MG tablet Take 2.5 mg PO q daily except for 5 mg PO on  Tues Qty: 30  tablet, Refills: 0      CONTINUE these medications which have NOT CHANGED   Details  amitriptyline (ELAVIL) 25 MG tablet Take 25 mg by mouth at bedtime.    atorvastatin (LIPITOR) 20 MG tablet Take 20 mg by mouth daily.    dutasteride (AVODART) 0.5 MG capsule Take 0.5 mg by mouth daily.    HUMALOG 100 UNIT/ML injection Inject 2-12 mg into the skin 3 (three) times daily before meals. Refills: 3    insulin glargine (LANTUS) 100 UNIT/ML injection Inject 0.1 mLs (10 Units total) into the skin daily. Qty: 10 mL, Refills: 11    metoprolol (LOPRESSOR) 50 MG tablet Take 50 mg by mouth 2 (two) times daily.    Multiple Vitamin (MULTIVITAMIN WITH MINERALS) TABS tablet Take 1 tablet by mouth daily.  Probiotic Product (PROBIOTIC PO) Take 15 mLs by mouth 2 (two) times daily.    amLODipine (NORVASC) 5 MG tablet Take 1 tablet (5 mg total) by mouth daily.      STOP taking these medications     lisinopril (PRINIVIL,ZESTRIL) 20 MG tablet      mineral oil liquid      triamcinolone cream (KENALOG) 0.1 %      insulin aspart (NOVOLOG) 100 UNIT/ML injection        Allergies  Allergen Reactions  . Nitroglycerin Anaphylaxis  . Penicillins Anaphylaxis, Hives and Shortness Of Breath    Has patient had a PCN reaction causing immediate rash, facial/tongue/throat swelling, SOB or lightheadedness with hypotension: Yes Has patient had a PCN reaction causing severe rash involving mucus membranes or skin necrosis: Yes Has patient had a PCN reaction that required hospitalization Yes Has patient had a PCN reaction occurring within the last 10 years: No If all of the above answers are "NO", then may proceed with Cephalosporin use.   . Sudafed  [Pseudoephedrine Hcl] Hives  . Tetracycline Shortness Of Breath    Other reaction(s): SHORTNESS OF BREATH  . Vancomycin Hives  . Doxycycline Rash    Other reaction(s): RASH  . Sulfa Antibiotics Rash   Follow-up Information    Follow up with NADEL,SCOTT M,  MD In 2 weeks.   Specialty:  Pulmonary Disease   Contact information:   [address] [place] [address] [phone]        The results of significant diagnostics from this hospitalization (including imaging, microbiology, ancillary and laboratory) are listed below for reference.    Significant Diagnostic Studies: Dg Chest 2 View  03/23/2016  CLINICAL DATA:  Shortness of breath and weakness for 3 days EXAM: CHEST  2 VIEW COMPARISON:  03/07/2016 FINDINGS: Cardiac shadow is stable. The lungs are again well aerated. Stable right middle lobe opacity is noted. This is similar to that seen on the prior exam. No new focal infiltrate is seen. No sizable effusion is noted. IMPRESSION: Stable changes in the right lung base. Electronically Signed   By: Alcide Clever M.D.   On: 03/23/2016 17:46   Dg Chest 2 View  03/07/2016  CLINICAL DATA:  Weakness.  Urinary tract infection. EXAM: CHEST  2 VIEW COMPARISON:  Radiographs 02/08/2016. Included lung bases from abdominal CT 01/16/2016 FINDINGS: Patchy opacity in the right middle lobe. This is increased from prior exam. The left lung is clear. Heart size is normal, mild tortuosity and atherosclerosis of the thoracic aorta. No pulmonary edema. No pleural effusion or pneumothorax. Probable remote right rib fractures. IMPRESSION: Focal right middle lobe opacity. Recurrent pneumonia versus pulmonary mass. Recommend chest CT. Electronically Signed   By: Rubye Oaks M.D.   On: 03/07/2016 00:58   Dg Abd 1 View  03/24/2016  CLINICAL DATA:  Acute generalized abdominal pain.  Constipation. EXAM: ABDOMEN - 1 VIEW COMPARISON:  February 01, 2016. FINDINGS: Large amount of stool is noted throughout the colon. No abnormal bowel dilatation is noted. Status post cholecystectomy. IMPRESSION: Large amount of stool seen throughout the colon suggesting constipation. No abnormal bowel dilatation is noted. Electronically Signed   By: Lupita Raider, M.D.   On: 03/24/2016 10:00    Ct Chest Wo Contrast  03/07/2016  CLINICAL DATA:  Acute onset of generalized weakness, diaphoresis, unexpected weight loss and lower back pain. Dysuria. Initial encounter. EXAM: CT CHEST WITHOUT CONTRAST TECHNIQUE: Multidetector CT imaging of the chest was performed following the  standard protocol without IV contrast. COMPARISON:  Chest radiograph performed earlier today at 12:25 a.m. FINDINGS: There is persistent opacification along the posterior aspect of the right middle lobe, with prominent spiculation tracking along the right major fissure. This measures approximately 3.5 cm, not including spiculations. Given persistence from April, malignancy cannot be excluded. Chronic infection could have a similar appearance. There is a spiculated nodular density measuring 1.5 cm near the right lung apex, with an additional smaller adjacent nodule. Minimal hazy opacity is seen within the right lung, which may reflect acute infection. Scattered blebs are seen at the lung apices. The left lung appears relatively clear. No pleural effusion or pneumothorax is seen. Diffuse coronary artery calcification is seen. Scattered calcification is seen along the aortic arch and descending thoracic aorta, and along the proximal great vessels. No pericardial effusion is identified. No mediastinal lymphadenopathy is seen. The thyroid gland is unremarkable. No axillary lymphadenopathy is seen. The visualized portions of the liver and spleen are grossly unremarkable. The patient is status post cholecystectomy, with clips noted at the gallbladder fossa. Diffuse calcification is seen along the pancreas, compatible with sequelae of chronic pancreatitis. The visualized portions of the adrenal glands and kidneys are grossly unremarkable. Mild right-sided hydronephrosis is again noted. IMPRESSION: 1. Persistent opacification along the posterior aspect of the right middle lung lobe, with prominent spiculation tracking along the right major  fissure. This measures approximately 3.5 cm, not including spiculation. Given persistence from April, malignancy cannot be excluded. Chronic infection could have a similar appearance. PET/CT or biopsy would be helpful for further evaluation. 2. Spiculated nodular densities at the right lung apex, measuring up to 1.5 cm, which could reflect metastatic disease. 3. Minimal hazy opacity within the right lung may reflect mild acute infection. 4. Scattered blebs at the lung apices. 5. Diffuse coronary artery calcifications seen. 6. Diffuse calcification along the pancreas, compatible sequelae of chronic pancreatitis. 7. Mild chronic right-sided hydronephrosis again noted. Electronically Signed   By: Roanna RaiderJeffery  Chang M.D.   On: 03/07/2016 02:05   [place] Renal  03/24/2016  CLINICAL DATA:  Renal failure.  Diabetes, hypertension. EXAM: RENAL / URINARY TRACT ULTRASOUND COMPLETE COMPARISON:  None. FINDINGS: Right Kidney: Length: 10.1 cm. Hypoechoic mass within the inferior pole of the right kidney measures 1.2 x 1 x 0.7 cm, too small to definitively characterize, probable cyst. No renal stone or hydronephrosis appreciated. No perinephric fluid. Renal cortex appears grossly normal in thickness and echogenicity. Left Kidney: Length: 9.3 cm. 7 mm echogenic focus within the left renal pelvis is suspicious for nonobstructing stone. No hydronephrosis. No perinephric fluid. Renal cortex appears grossly normal in thickness and echogenicity. Bladder: Layering debris within the bladder.  Bladder otherwise unremarkable. IMPRESSION: 1. Nonobstructing left renal stone, measuring approximately 7 mm, as also seen on earlier abdomen CT dated 01/16/2016. No associated hydronephrosis. 2. Probable small cyst within the lower pole of the right kidney, too small to definitively characterize, measuring 1.2 x 1 x 0.7 cm, also described on ultrasound report of 02/03/2016. Electronically Signed   By: Bary RichardStan  Maynard M.D.   On: 03/24/2016 17:28     Microbiology: Recent Results (from the past 240 hour(s))  Urine culture     Status: Abnormal   Collection Time: 03/23/16  4:35 PM  Result Value Ref Range Status   Specimen Description URINE, RANDOM  Final   Special Requests NONE  Final   Culture MULTIPLE SPECIES PRESENT, SUGGEST RECOLLECTION (A)  Final   Report Status 03/25/2016 FINAL  Final  Culture, blood (routine x 2)     Status: None (Preliminary result)   Collection Time: 03/23/16 10:20 PM  Result Value Ref Range Status   Specimen Description BLOOD RIGHT HAND  Final   Special Requests AEROBIC BOTTLE ONLY 6CC  Final   Culture   Final    NO GROWTH 2 DAYS Performed at [facility]    Report Status PENDING  Incomplete  Culture, blood (routine x 2)     Status: None (Preliminary result)   Collection Time: 03/23/16 10:25 PM  Result Value Ref Range Status   Specimen Description BLOOD LEFT HAND  Final   Special Requests IN PEDIATRIC BOTTLE 3CC  Final   Culture   Final    NO GROWTH 2 DAYS Performed at [facility]    Report Status PENDING  Incomplete     Labs: Basic Metabolic Panel:  Recent Labs Lab 03/23/16 1652 03/23/16 1739 03/23/16 2115 03/24/16 0109 03/25/16 0448 03/26/16 0323  NA 139 144  --  141 146* 144  K 4.4 4.2  --  3.7 2.9* 3.3*  CL 119* 118*  --  120* 119* 114*  CO2 11*  --   --  13* 17* 21*  GLUCOSE 160* 146*  --  286* 209* 143*  BUN 79* 88*  --  79* 76* 69*  CREATININE 4.13* 4.30*  --  4.09* 3.97* 3.89*  CALCIUM 8.0*  --   --  7.8* 8.0* 7.9*  MG  --   --  1.6*  --   --  1.9  PHOS  --   --  7.6*  --   --   --    Liver Function Tests:  Recent Labs Lab 03/23/16 1652  AST 21  ALT 22  ALKPHOS 88  BILITOT 0.6  PROT 7.0  ALBUMIN 3.6   No results for input(s): LIPASE, AMYLASE in the last 168 hours. No results for input(s): AMMONIA in the last 168 hours. CBC:  Recent Labs Lab 03/23/16 2115 03/24/16 0109 03/24/16 2054 03/25/16 0448 03/26/16 0323  WBC 6.2 6.0  --  6.4 6.0   NEUTROABS 4.1  --   --   --   --   HGB 7.0* 6.9* 9.5* 8.9* 8.9*  HCT 20.1* 19.4* 26.4* 25.5* 25.2*  MCV 89.3 86.6  --  83.3 86.3  PLT 244 248  --  229 226   Cardiac Enzymes: No results for input(s): CKTOTAL, CKMB, CKMBINDEX, TROPONINI in the last 168 hours. BNP: BNP (last 3 results)  Recent Labs  02/01/16 0332 03/23/16 1652  BNP 792.4* 121.2*    ProBNP (last 3 results) No results for input(s): PROBNP in the last 8760 hours.  CBG:  Recent Labs Lab 03/25/16 1150 03/25/16 1701 03/25/16 2149 03/26/16 0731 03/26/16 1136  GLUCAP 186* 282* 249* 146* 136*       Signed:  Jeralyn Bennett MD.  [facility] 03/26/2016, 1:53 PM

## 2016-03-26 NOTE — Care Management Obs Status (Signed)
MEDICARE OBSERVATION STATUS NOTIFICATION   Patient Details  Name: Paul Guerra MRN: [id number] Date of Birth: August 27, 1935   Medicare Observation Status Notification Given:  Yes    Geni BersMcGibboney, Mckaylin Bastien, RN 03/26/2016, 3:38 PM

## 2016-03-26 NOTE — Telephone Encounter (Signed)
Spoke with [name] with [facility]- states that [facility] but will be sent home today and wants to be admitted to hospice.  [facility] to be attending if he is willing.   [name] with [facility] is requesting last office note be faxed to her at [phone] if SN is willing to be attending.    SN please advise.  Thanks!

## 2016-03-27 NOTE — Telephone Encounter (Signed)
Yes, I agree w/ Hospice services.  I am willing to be his attending w/ Hospice physician team helping w/ symptom management.  I gave copies of my last note & [facility] recent [facility] to [facility]... SMN

## 2016-03-28 LAB — CULTURE, BLOOD (ROUTINE X 2)
Culture: NO GROWTH
Culture: NO GROWTH

## 2016-03-30 ENCOUNTER — Telehealth: Payer: Self-pay | Admitting: Pulmonary Disease

## 2016-03-30 NOTE — Telephone Encounter (Signed)
This [facility] to [facility] but per the 6.15.17 message [facility] Also per that message, I have called the Hospice nurse about SN being the attending  [facility] and spoke Lucile CraterKarel who reported pt is not in the [facility] system at all Will close and continue to follow the previous message regarding [place]

## 2016-03-30 NOTE — Telephone Encounter (Signed)
LMOM TCB x1 for [name] - unsure if she was aware that SN will be the attending.  Would like to clarify.

## 2016-03-30 NOTE — Telephone Encounter (Signed)
Noted. Will route message to SN to make him aware. 

## 2016-04-02 NOTE — Telephone Encounter (Signed)
Spoke with [name] w/ [facility] who reported pt was admitted under kidney failure, therefore [facility]

## 2016-04-02 NOTE — Telephone Encounter (Signed)
Called spoke with [name] yesterday The 6.19.17 phone message was from her w/ [facility] Pt was readmitted to the hospital for kidney failure and admitted to Hospice under that diagnosis, therefore pt is now under the care of the Hospice director Nothing further needed

## 2016-04-15 ENCOUNTER — Ambulatory Visit: Payer: Medicare Other | Admitting: Pulmonary Disease

## 2016-04-27 ENCOUNTER — Ambulatory Visit: Payer: Medicare Other | Admitting: Pulmonary Disease

## 2016-04-28 ENCOUNTER — Encounter: Payer: Self-pay | Admitting: Pulmonary Disease

## 2016-04-28 ENCOUNTER — Ambulatory Visit: Payer: Medicare Other | Admitting: Pulmonary Disease

## 2016-04-28 VITALS — BP 108/70 | HR 62 | Temp 97.8°F | Ht 75.0 in | Wt 160.0 lb

## 2016-04-28 DIAGNOSIS — R9389 Abnormal findings on diagnostic imaging of other specified body structures: Secondary | ICD-10-CM

## 2016-04-28 DIAGNOSIS — R938 Abnormal findings on diagnostic imaging of other specified body structures: Secondary | ICD-10-CM

## 2016-04-28 DIAGNOSIS — R5381 Other malaise: Secondary | ICD-10-CM

## 2016-04-28 DIAGNOSIS — R531 Weakness: Secondary | ICD-10-CM

## 2016-04-28 DIAGNOSIS — N184 Chronic kidney disease, stage 4 (severe): Secondary | ICD-10-CM

## 2016-04-28 DIAGNOSIS — Z8701 Personal history of pneumonia (recurrent): Secondary | ICD-10-CM

## 2016-04-28 DIAGNOSIS — D638 Anemia in other chronic diseases classified elsewhere: Secondary | ICD-10-CM

## 2016-04-28 NOTE — Patient Instructions (Signed)
Paul Guerra, it was great seeing you again...    We reviewed your progressive since June...    You are now well [facility] w/ [facility]...  We discussed using OTC MUCINEX 600mg  one tab twice daily, followed by extra water/fluids...    Use the INCENTIVE SPIROMETER to aid in getting good deep breaths and expanding your lungs...    Try to expectorate any sputum from your lungs...  Your oxygen level is fine at rest, BUT it drops w/ exercise/ walking/ elev heart rate (eg- AFib) and you should use your Oxygen at 2L/min flow w/ activity...    You should also use it at night while sleeping...  You are due for a follow up CXR/ CT SCAN OF YOUR LUNGS towards the end of August...    I will send a note to Dr. Hulan FessGladman so that she can coordinate getting this scan [facility]...  Please call for any questions or if I can be of service in any way.Marland Kitchen..Marland Kitchen

## 2016-04-28 NOTE — Progress Notes (Signed)
Subjective:     Patient ID: Paul Guerra, male   DOB: October 03, 1935, 81 y.o.   MRN: [id number]  HPI 81 y/o WM, referred for a pulmonary evaluation by Brynda Peon due to an abnormal CT Chest...  ~  March 12, 2016:  Initial Pulmonary consultation w/ SN>      Pertinent medical history is difficult to piece together as he has had the majority of his prev medical care in [facility], [place]/ [place], [facility]> but I have reviewed extensive Care Everywhere records plus our Epic chart>>  He was transferred from [facility] to [place] hospitalized here 4/7 -02/15/16 by [facility] w/ Nephrology, Urology, & CCM consulting (fluid overload, RLL pseudomonas pneumonia, and acute hypoxemic resp failure requiring transient intubation);  He was disch to a NH-Rehab facility [facility] for 21d but says he only saw a physician once during the stay;  He went to [facility] clinic/ER 5/26 w/ weakness, dysuria & mult issues=> note reviewed w/ Labs & looks like plans were made for Adm but it never happened so he went to [place] and subsequently back to [place] where he was seen by [facility] in the [facility]=> note reviewed;  He felt that pt had mult chr issues, he was given IVF, no active infection or UTI found, CXR showed persistent RLL opac & CT was ordered revealing persistent opac in RML ~3.5cm and a 1.5cm noduluar density in Rt lung apex;  He was referred to [facility] for an ASAP appt.  I have formulated this PROBLEM LIST from these EMR notes >>     1) PULM>> Hx Pseudomonas Pneumonia in RLL 4-02/2016 w/ persistent opacity and abnormal CT Chest>  He admits to mild cough, not much sput (beige), no hemoptysis, no f/c/s, no wheezing/ congestion, +SOB/DOE w/ min activity & very weak; Hx acute hypoxemic respiratory failure secondary to vol overload and pneumonia>  He was transiently intuibated during the 4-02/2016 hospitalization; disch to NH w/o need for O2. Ex-smoker who continues to abuse chewing tobacco>  Started smoking in  teens, smoked for 40 yrs up to 1ppd, quit in 1990 but he continued chewing tobacco up to the present. Restrictive lung physiology on spirometry Generalized weakness and physical debilitation  CXR 03/07/16>  Patchy opacity in the right middle lobe. This is increased from prior exam. The left lung is clear. Heart size is normal, mild tortuosity and atherosclerosis of the thoracic aorta. No pulmonary edema. No pleural effusion or pneumothorax. Probable remote right rib fractures.  IMPRESSION: Focal right middle lobe opacity. Recurrent pneumonia versus pulmonary mass. Recommend chest CT.  CT Chest 03/07/16>  IMPRESSION:    a. Persistent opacification along the posterior aspect of the right middle lung lobe, with prominent spiculation tracking along the right major fissure. This measures approximately 3.5 cm, not including spiculation. Given persistence from April, malignancy cannot be excluded. Chronic infection could have a similar appearance. PET/CT or biopsy would be helpful for further evaluation.    b. Spiculated nodular densities at the right lung apex, measuring up to 1.5 cm, which could reflect metastatic disease.    c. Minimal hazy opacity within the right lung may reflect mild acute infection.    d. Scattered blebs at the lung apices.    e. Diffuse coronary artery calcifications seen.    f. Diffuse calcification along the pancreas, compatible sequelae of chronic pancreatitis.    g. Mild chronic right-sided hydronephrosis again noted.  Spirometry 03/12/16>  FVC=2.96 (58%), FEV1=2.11 (56%), %1sec=71%, mid-flows  are reduced at 46% predicted.  This is c/o a restrictive ventilatory defect w/ superimposed small airways disease...  Ambulatory Oximetry 03/12/16>  O2sat on RA at rest=99%;  He was only able to rise from sitting in [facility] & walk to the exam room door then had to sit back down due to weakness & lowest O2sat=98%...    LABS 03/12/16>  [place]- BS=303, A1c=8.7, BUN=67, Cr=3.82, HCO3=17;  CBC- Hg=8.4, MCV=88,  Fe=102 (32%sat), Ferritin=465, B12=676;  Sed=28;  PT=43.5 & INR=4.0    2) CARDS>>  HBP, CAD- s/p stents in the past, PAF w/ a CHADSVASC=4 on Coumadin, Hypercholesterolemia>  He denies CP, palpitations, edema, but he is very weak & debilitated.      3) Diabetes. Metabolic>>  IDDM, diffuse pancreatic calcif c/w chr pancreatitis>    4) GI>>  Hx colon cancer w/ right hemicolectomy; constipation w/ fecal burden on prev XRays/scans and required disimpaction while in rehab>   CT Abd (stone study) 01/16/16>  IMPRESSION:    a. Focal consolidation or mass in the right middle lobe lung base, incompletely imaged. This is not ordinary atelectasis, and it could represent a significant pneumonia or neoplasm. Recommend PA/lateral chest radiography at a minimum, chest CT if clinical suspicion is high.    b. Marked colonic redundancy with a very large volume of colonic stool. Small bowel is also slightly prominent in caliber, with fecalization of contents distally. However, no mechanical bowel obstruction is evident. There is no focal mass or inflammatory change evident. This could represent fecal impaction, as the rectum is distended with stool.    c. Right inguinal hernia containing unobstructed small bowel.    d. Right hydronephrosis, probably long-standing given the lack of perinephric or periureteral stranding. This may represent a UPJ obstruction.    e. Left nephrolithiasis    f. Diffuse pancreatic parenchymal calcifications, likely sequela of chronic pancreatitis.    5) RENAL>>  Renal Failure>  Hx BPH, urinary incontinence, UTIs & dysuria, kidney stones w/ surg in past, renal failure w/ AKI 01/2016, ATN, and rt hydronephrosis treated w/ cysto & double J stent>    He had normal renal function in 2012 in [place] w/ BUN/Cr= 20/0.71 w/ GFR>60    Baseline labs from 04/2015 in [place] indicates that BUN/Cr= 30/1.12 w/ GFR=64 and HCO3=20 (raising the poss of an underlying RTA problem)    On Adm 01/17/16 Labs showed  BUN/Cr= 67/3.70 w/ HAF=79 and a metabolic acidosis w/ UXY3=33    During the [facility] stay renal function deteriorated to BUN/Cr= 109/5.96 and he was seen by Nephrology (DrDeterding) & Urology (DrDahlstedt);  He had a renal Bx that showed ATN (no glomerular pathology) and Ultrasound showed Rt hydronephrosis & a ureteral stent placed by Urology (later removed).    By [place] on 02/15/16 renal function had ret to baseline w/ BUN/Cr=67/3.68 and HCO3=21; per DrSchertz at disch-- f/u in our clinic in 2-4 weeks; we will contact patient and/or SNF depending on where he ends up; please put in d/c summary to avoid all nsaids/ ACE inhibitors/ ARB's/ contrast indefinitely & avoid overtreatment of HTN, keep SBP > 110 minimum (unfortunately he never got this follow up appt)...    6) HEME>>  Anemia>  Prob multifactorial w/ anemia of chronic renal failure, Coumadin anticoag, etc>    Hg was in the 10-11 range in 2012    Care Everywhere labs showed Hg~11 in [id number], then Hg=8.5 in Nov2016 (PT/INR was>10), and down to 7.9 nadir Apr2017 (PT/INR was 3.5-4.5 on adm)  Anemia work up showed normal Iron levels/ TIBC/ saturation and Ferritin values; B12 was similarly wnl...    He received 2u of PCs during his [facility] 01/2016    He has not been started on aranesp    7) NEURO>>  Prev CT Brain 05/2011 w/ mod to severe atrophy and small vessel disease>    8) GENERAL>>  FTT, markedly debilitated>    ~  April 28, 2016:  6wk ROV w/ SN>   Paul Guerra returns w/ his daughter for pulmonary follow up> he has had a lot going on over the last month & records once again reviewed to monitor his progress...    He never made it to his 1st outpt Nephrology appt & was ADM to [facility] in [place] 6/12- 03/26/16 by [facility] after presenting w/ worsening weakness & found to have acute on chronic renal failure w/ Cr up to 4.3 and a metabolic acidosis w/ RKY7=06; he was placed on a bicarb drip & Nephrology ([place]) felt he was not a dialysis candidate & rec disch home  w/ Hospice;  Other issues that ADM included Adb discomfort due to stool burden, hypokalemia, hypomagnesia, PAF on Coumadin- hospice to check Protimes, UTI- Rx Cipro, DM, Anemia- given 2u PCs...    He is now well established at [facility] and w/ Hospice => rec to use IS/ Mucinex, and he can follow up w/ pulm at [facility] when f/u CT Chest is deemed necessary...    Past Medical History  Diagnosis Date  . Recurrent nephrolithiasis 01/17/2016  . Chronic a-fib ([place]) 01/17/2016  . DM (diabetes mellitus) ([place]) 01/17/2016  . BPH (benign prostatic hyperplasia) 01/17/2016  . History of colon cancer 01/17/2016  . Constipation 01/17/2016  . HTN (hypertension), benign 01/17/2016  . Chronic pancreatitis ([place]) 01/17/2016  . Hyperlipidemia 01/17/2016  . Cardiac arrest ([place]) 01/17/2016  . Chewing tobacco use 01/17/2016  . Blood transfusion without reported diagnosis   . Anemia     Past Surgical History  Procedure Laterality Date  . Cholecystectomy    . Colon surgery      right hemicolectomy  . Cardiac catheterization      cardiac stents  . Transurethral resection of prostate    . Hernia repair Right 09/03/2014    incisional hernia repair/impantation of mesh/ventral hernia repair  . Lithotripsy    . Eye surgery      bilateral cataracts  . Cystoscopy w/ ureteral stent placement Right 01/20/2016    Procedure: CYSTOSCOPY WITH RETROGRADE PYELOGRAM/URETERAL STENT PLACEMENT;  Surgeon: Franchot Gallo, MD;  Location: [facility];  Service: Urology;  Laterality: Right;    Outpatient Encounter Prescriptions as of 04/28/2016  Medication Sig  . amitriptyline (ELAVIL) 25 MG tablet Take 25 mg by mouth at bedtime.  Marland Kitchen amLODipine (NORVASC) 5 MG tablet Take 1 tablet (5 mg total) by mouth daily.  Marland Kitchen atorvastatin (LIPITOR) 20 MG tablet Take 20 mg by mouth daily.  . bisacodyl (DULCOLAX) 5 MG EC tablet Take 1 tablet (5 mg total) by mouth daily as needed for moderate constipation.  . dutasteride (AVODART) 0.5 MG capsule Take 0.5 mg by  mouth daily.  Marland Kitchen HUMALOG 100 UNIT/ML injection Inject 2-12 mg into the skin 3 (three) times daily before meals.  . insulin glargine (LANTUS) 100 UNIT/ML injection Inject 0.1 mLs (10 Units total) into the skin daily.  . metoprolol (LOPRESSOR) 50 MG tablet Take 50 mg by mouth 2 (two) times daily.  . Multiple Vitamin (MULTIVITAMIN WITH MINERALS) TABS tablet Take 1 tablet by  mouth daily.  . polyethylene glycol (MIRALAX / GLYCOLAX) packet Take 17 g by mouth daily.  . Probiotic Product (PROBIOTIC PO) Take 15 mLs by mouth 2 (two) times daily.  . traZODone (DESYREL) 50 MG tablet Take 0.5 tablets (25 mg total) by mouth at bedtime as needed for sleep.  Marland Kitchen warfarin (COUMADIN) 2.5 MG tablet Take 2.5 mg PO q daily except for 5 mg PO on  Tues  . ciprofloxacin (CIPRO) 250 MG tablet Take 1 tablet (250 mg total) by mouth 2 (two) times daily. (Patient not taking: Reported on 04/28/2016)   No facility-administered encounter medications on file as of 04/28/2016.    Allergies  Allergen Reactions  . Nitroglycerin Anaphylaxis  . Penicillins Anaphylaxis, Hives and Shortness Of Breath    Has patient had a PCN reaction causing immediate rash, facial/tongue/throat swelling, SOB or lightheadedness with hypotension: Yes Has patient had a PCN reaction causing severe rash involving mucus membranes or skin necrosis: Yes Has patient had a PCN reaction that required hospitalization Yes Has patient had a PCN reaction occurring within the last 10 years: No If all of the above answers are "NO", then may proceed with Cephalosporin use.   . Sudafed  [Pseudoephedrine Hcl] Hives  . Tetracycline Shortness Of Breath    Other reaction(s): SHORTNESS OF BREATH  . Vancomycin Hives  . Doxycycline Rash    Other reaction(s): RASH  . Sulfa Antibiotics Rash    Current Medications, Allergies, Past Medical History, Past Surgical History, Family History, and Social History were reviewed in [employer]  record.   Review of Systems             All symptoms NEG except where BOLDED >>  Constitutional:  F/C/S, fatigue, anorexia, unexpected weight change. HEENT:  HA, visual changes, hearing loss, earache, nasal symptoms, sore throat, mouth sores, hoarseness. Resp:  cough, sputum, hemoptysis; SOB, tightness, wheezing. Cardio:  CP, palpit, DOE, orthopnea, edema. GI:  N/V/D/C, blood in stool; reflux, abd pain, distention, gas. GU:  dysuria, freq, urgency, hematuria, flank pain, voiding difficulty. MS:  joint pain, swelling, tenderness, decr ROM; neck pain, back pain, etc. Neuro:  HA, tremors, seizures, dizziness, syncope, weakness, numbness, gait abn. Skin:  suspicious lesions or skin rash. Heme:  adenopathy, bruising, bleeding. Psyche:  confusion, agitation, sleep disturbance, hallucinations, anxiety, depression suicidal.   Objective:   Physical Exam       Vital Signs:  Reviewed...   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 HEENT:  /AT; Conjunctiva- pink, Sclera- nonicteric, EOM-wnl, PERRLA, EACs-wax, NOSE-clear; THROAT-dry, clear & w/o lesions seen. Neck:  Supple w/ fair ROM; no JVD; normal carotid impulses w/o bruits; no thyromegaly or nodules palpated; no lymphadenopathy.  Chest:  Decr BS bilat but clear w/o wheezes, rales, or rhonchi... Heart:  Regular Rhythm; norm S1 & S2 Gr1/6 SEM w/o rubs or gallops detected. Abdomen:  Soft & nontender- no guarding or rebound; decreased bowel sounds; no organomegaly or masses palpated. Ext:  DecrROM; without deformities +arthritic changes; no varicose veins, +venous insuffic, no edema;  Pulses intact w/o bruits. Neuro:  No focal neuro deficits, +gait abnormality, balance if off, diffusely weak... Derm:  No lesions noted; no rash etc. Lymph:  No cervical, supraclavicular, axillary, or inguinal adenopathy palpated.   Assessment:      Hx  Pseudomonas Pneumonia in RLL 4-02/2016 w/ persistent opacity and abnormal CT Chest>  He  admits to mild cough, not much sput (beige), no hemoptysis, no f/c/s, no wheezing/ congestion, +SOB/DOE w/ min activity & very weak; Hx acute hypoxemic respiratory failure secondary to vol overload and pneumonia>  He was transiently intuibated during the 4-02/2016 hospitalization; disch to NH w/o need for O2. Ex-smoker who continues to abuse chewing tobacco>  Started smoking in teens, smoked for 40 yrs up to 1ppd, quit in 1990 but he continued chewing tobacco up to the present. Restrictive lung physiology on spirometry Generalized weakness and physical debilitation  From the pulmonary standpoint he does not need oxygen, inhalers, etc; rec to use IS regularly to get better lung expansion,and we will monitor CXR & scans over time... ROV 1 month.  HBP, CAD- s/p stents in the past, PAF w/ a CHADSVASC=4 on Coumadin, Hypercholesterolemia>  He denies CP, palpitations, edema, but he is very weak & debilitated.    ?on Amlod5, Metop50Bid, Coumadin5- taking 1 x6d & 1/2 on Tues;  PT/INR= 4.0 & we held it x 2d and decr dose to 1/2 tab Qd x7d for now; needs Protimes by visiting nurses?  IDDM, diffuse pancreatic calcif c/w chr pancreatitis>  BS=303, A1c=8.7 on Lantus 10u Qd, and SS Humalog Tid before meals (2-10u SS)  Rec to increase Lantus to 20u Qd & continue SS coverage & monitoring BS  Hx colon cancer w/ right hemicolectomy; constipation w/ fecal burden on prev XRays/scans and required disimpaction while in rehab>    Renal Failure>  Hx BPH, urinary incontinence, UTIs & dysuria, kidney stones w/ surg in past, renal failure w/ AKI 01/2016, ATN, and rt hydronephrosis treated w/ cysto & double J stent>  He was supposed to f/u w/ Nephrology 2wks after Tx to Rehab center & have weekly labs sent to them (see DrSchertz disch note); this was never done- he needs Nephrology appt & management of his CRF  Anemia>  Prob multifactorial w/  anemia of chronic renal failure, Coumadin anticoag, etc>  He needs to get started on Aranesp per Nephrology as part of their CRF management.   FTT, markedly debilitated>   He has visiting nurses and Home Pt 2x per week but he lives in [place]...  Prev CT Brain 05/2011 w/ mod to severe atrophy and small vessel disease>      Plan:     Patient's Medications  New Prescriptions               REC to use Incentive Spirometer Q1H while wake to aide better lung expansion...   Previous Medications   AMITRIPTYLINE (ELAVIL) 25 MG TABLET    Take 25 mg by mouth at bedtime.   AMLODIPINE (NORVASC) 5 MG TABLET    Take 1 tablet (5 mg total) by mouth daily.   ATORVASTATIN (LIPITOR) 20 MG TABLET    Take 20 mg by mouth daily.   DUTASTERIDE (AVODART) 0.5 MG CAPSULE    Take 0.5 mg by mouth daily.   INSULIN ASPART (NOVOLOG) 100 UNIT/ML INJECTION    Inject 0-15 Units into the skin 4 (four) times daily -  before meals and at bedtime.   INSULIN GLARGINE (LANTUS) 100 UNIT/ML INJECTION    Inject 0.1 mLs (10 Units total) into the skin daily.   LISINOPRIL (PRINIVIL,ZESTRIL) 20 MG TABLET    Take 20 mg by mouth daily.    METOPROLOL (LOPRESSOR) 25 MG TABLET    Take 1 tablet (25 mg total) by mouth 2 (two) times daily.   MINERAL OIL LIQUID    Take 30 mLs  by mouth daily as needed for moderate constipation.   MULTIPLE VITAMIN (MULTIVITAMIN WITH MINERALS) TABS TABLET    Take 1 tablet by mouth daily.   TRIAMCINOLONE CREAM (KENALOG) 0.1 %    Apply 1 application topically 2 (two) times daily as needed (rash).    WARFARIN (COUMADIN) 5 MG TABLET    Take 1.5 tablets (7.5 mg total) by mouth as directed. TAKE 7.5MG DAILY TILL INR >2, THEN TITRATE down to keep INR>2  Modified Medications   No medications on file  Discontinued Medications   No medications on file

## 2016-09-11 DEATH — deceased

## 2017-05-19 IMAGING — DX DG CHEST 2V
3 series · 3 of 3 positions shown · non-contrast
Comparison: January 16, 2016

CLINICAL DATA: Anemia and chronic atrial fibrillation. Acute renal
insufficiency.

EXAM:
CHEST  2 VIEW

[chest ap (1 of 2)]
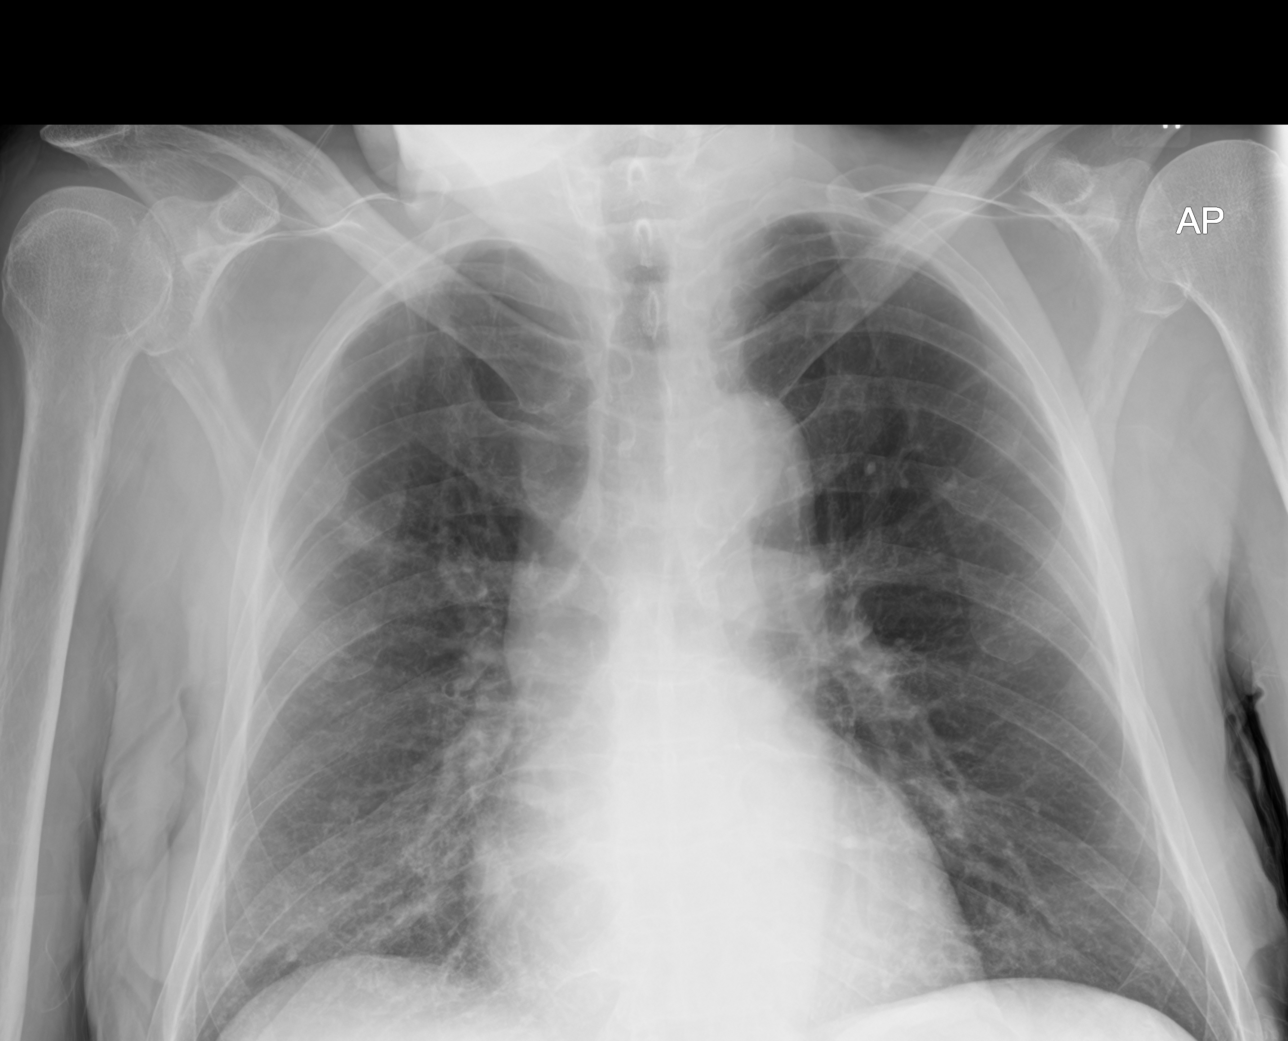

[chest ap (2 of 2)]
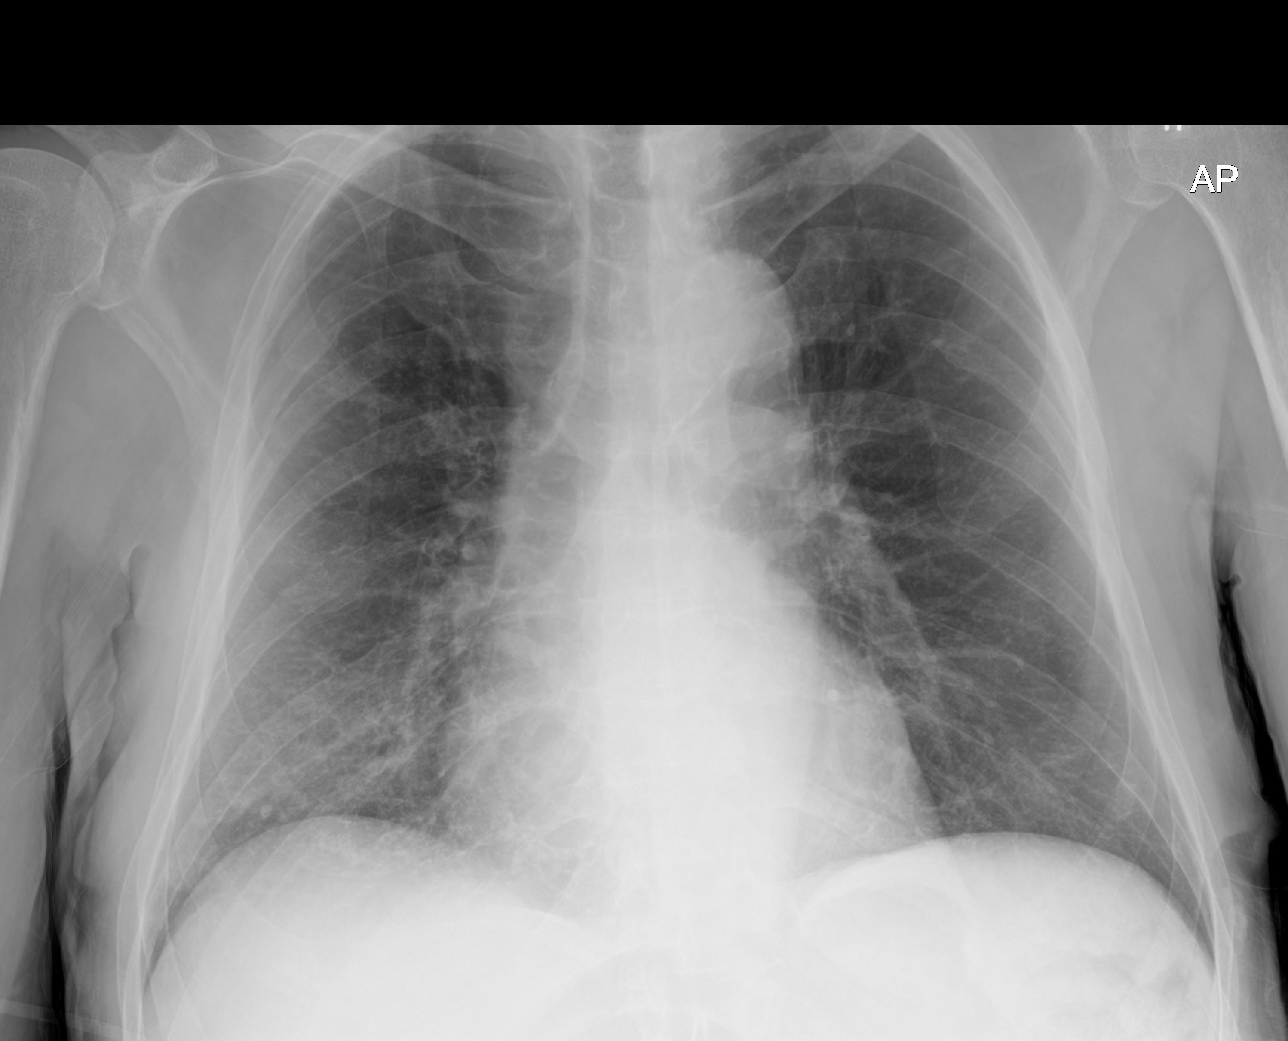

[chest lat]
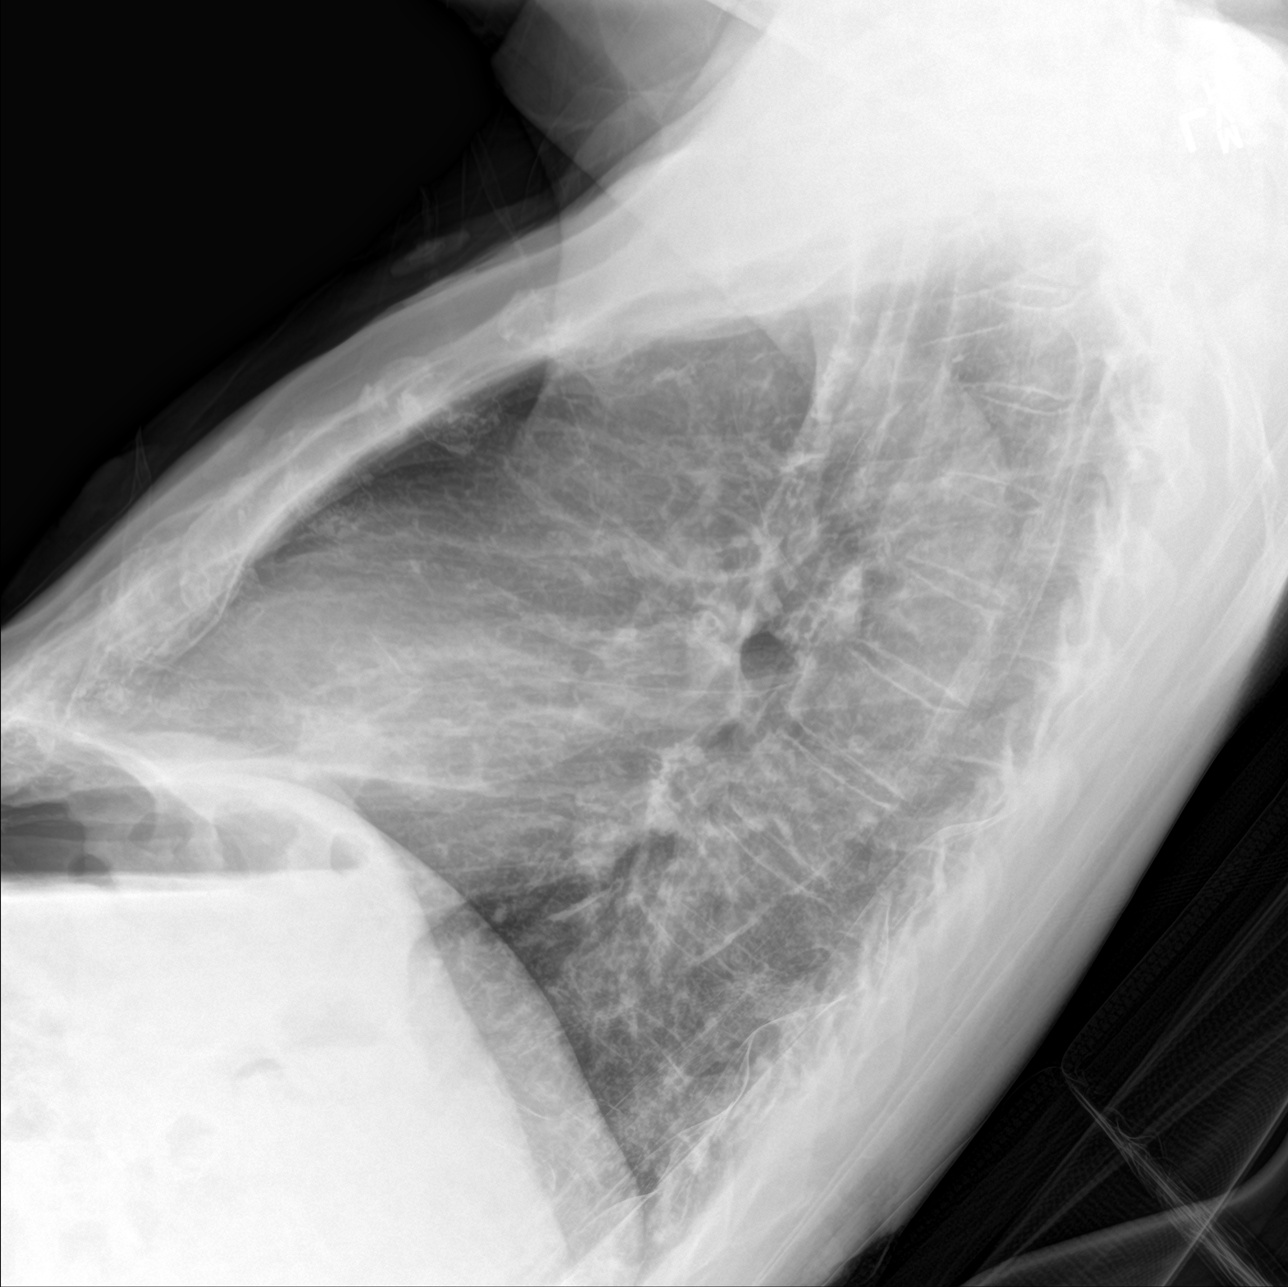

[3 of 3 positions shown; findings below may reference images not displayed]

FINDINGS: There is persistent patchy infiltrate in the right lower lobe,
stable. There is subtle new opacity in the right upper lobe, likely
extension of pneumonia. Left lung is clear. Heart size and pulmonary
vascularity are normal. No adenopathy. No bone lesions. There is
atherosclerotic calcification in the aorta.
IMPRESSION: Right lower lobe infiltrate, stable. Subtle patchy opacity right
upper lobe, felt to represent mild extension of pneumonia. Left lung
remains clear. No change in cardiac silhouette.

## 2017-06-03 IMAGING — DX DG CHEST 1V PORT
1 series · 1 of 1 positions shown · non-contrast
Comparison: 01/31/2016 at 8312 hour

CLINICAL DATA: Post intubation.

EXAM:
PORTABLE CHEST 1 VIEW

[chest ap]
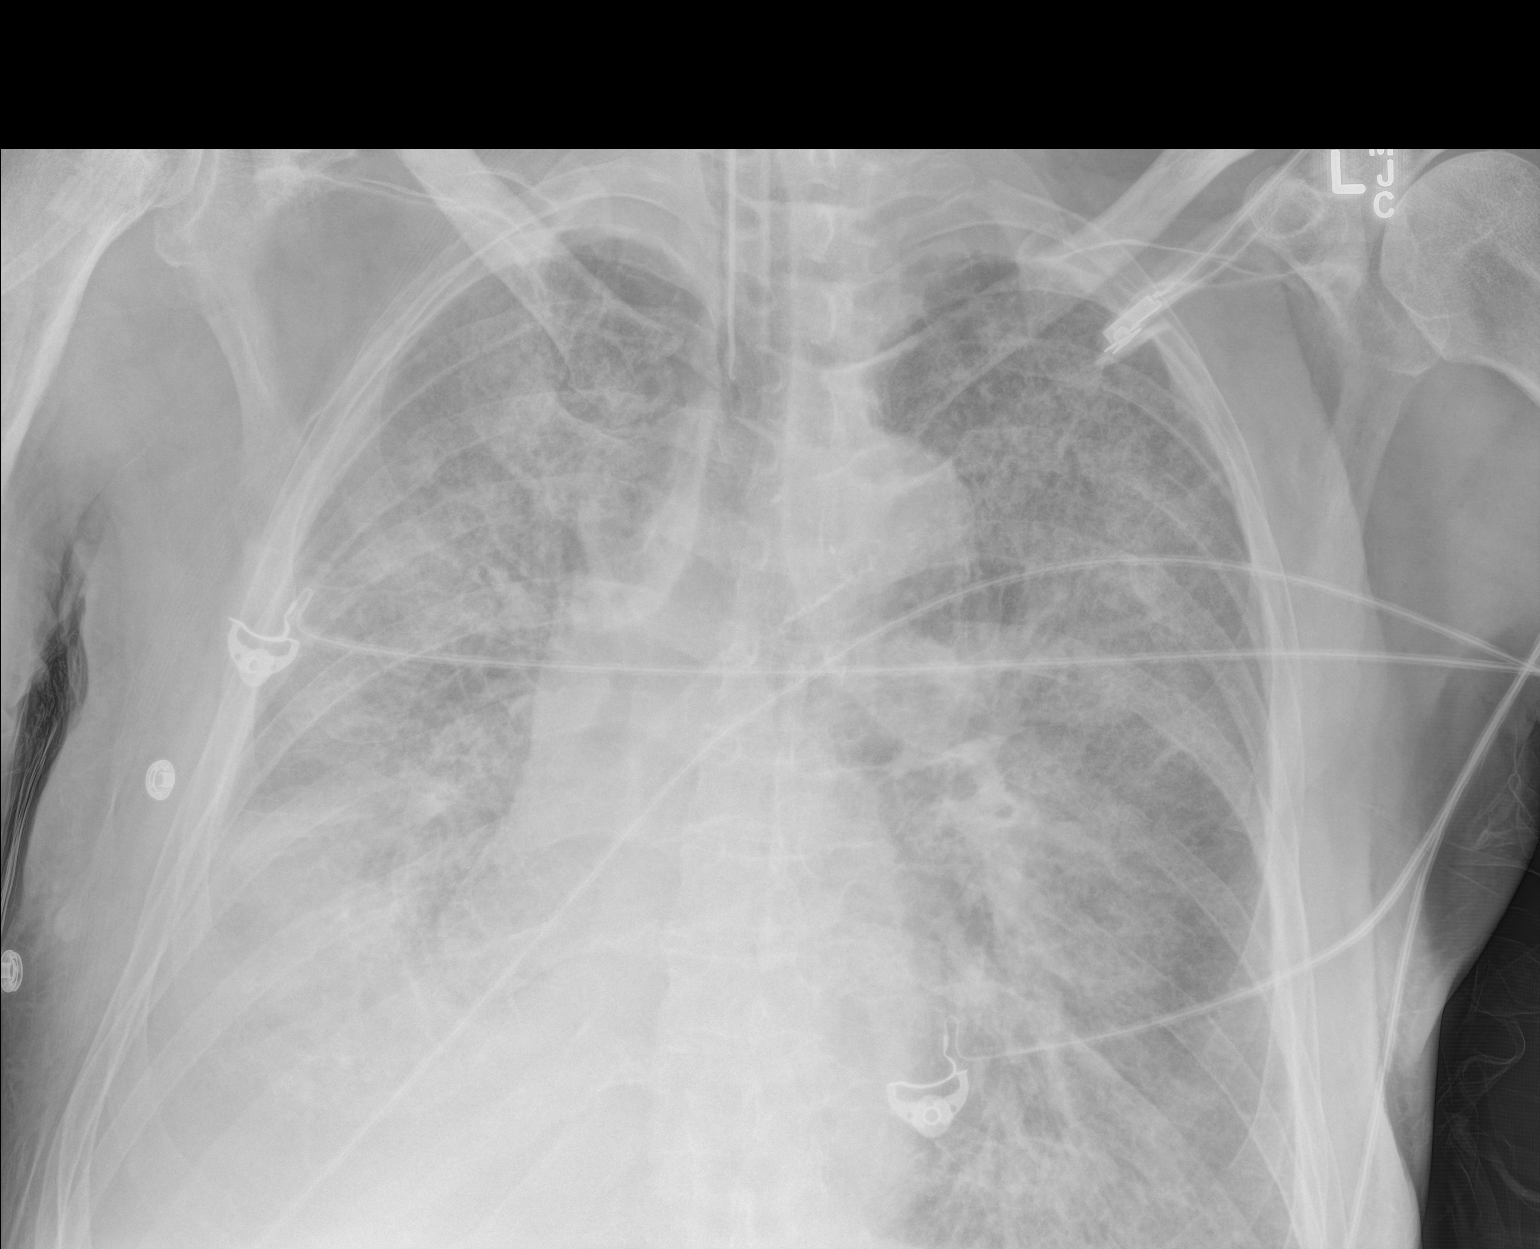

[1 of 1 positions shown; findings below may reference images not displayed]

FINDINGS: Endotracheal tube is 7.1 cm from the carina. Progressive bilateral
interstitial opacities concerning for increased pulmonary edema.
Bibasilar opacities may be confluent edema or infectious, right
greater than left. Probable developing right pleural effusion.
Cardiomediastinal contours are unchanged. Remote right rib fracture.
IMPRESSION: 1. Endotracheal tube 7.1 cm from the carina.
2. Progressive pulmonary edema.
3. More confluent right greater than lung base opacities may be
confluent vascular structures, pneumonia or aspiration. Probable
developing right pleural effusion.

## 2017-08-29 IMAGING — US US RENAL
1 series · 14 of 25 positions shown · non-contrast
Comparison: CT abdomen and pelvis 01/16/2016.  No prior ultrasound.

CLINICAL DATA: 80-year-old presenting with acute renal failure.
Current history of diabetes and hypertension. Current history of
benign prostatic hypertrophy.

EXAM:
RENAL / URINARY TRACT ULTRASOUND COMPLETE

[Series 1: us renal · 0.26mm/px · 14 of 70 slices shown]
[im 1/70]
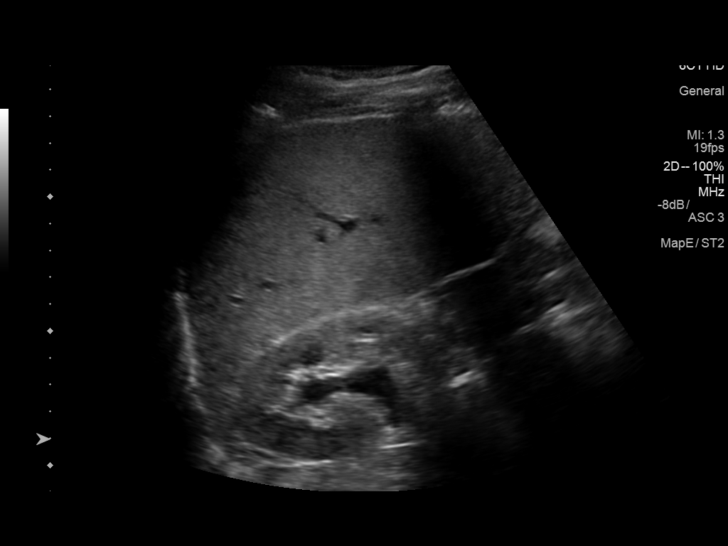
[im 6/70]
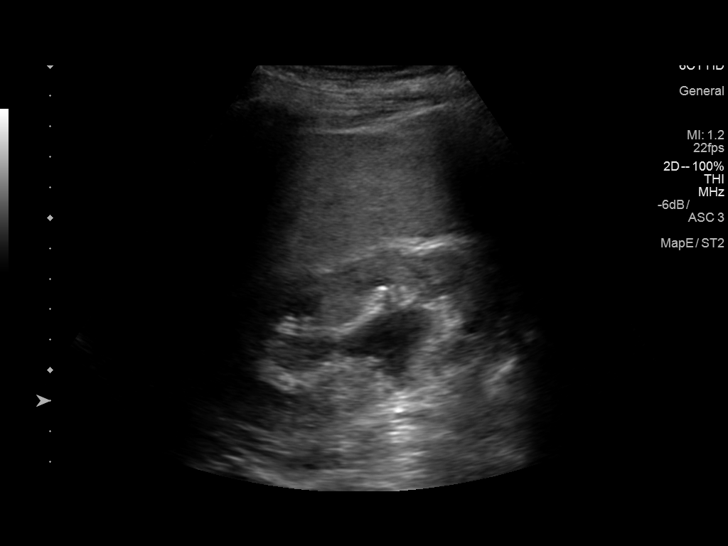
[im 12/70]
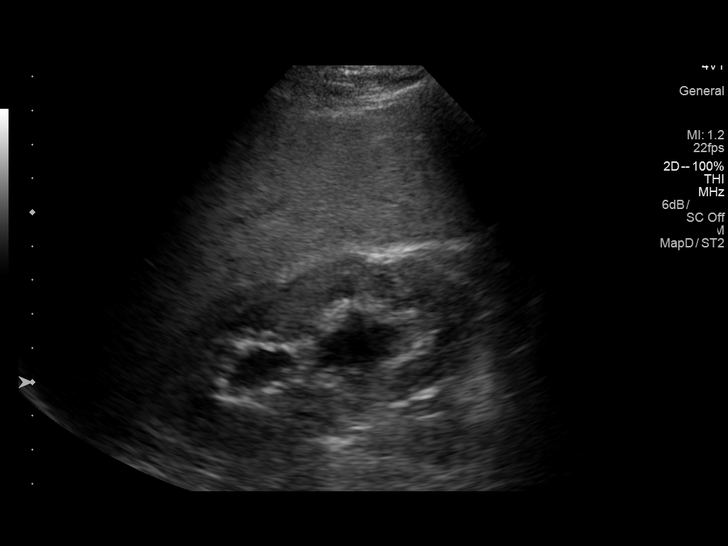
[im 18/70]
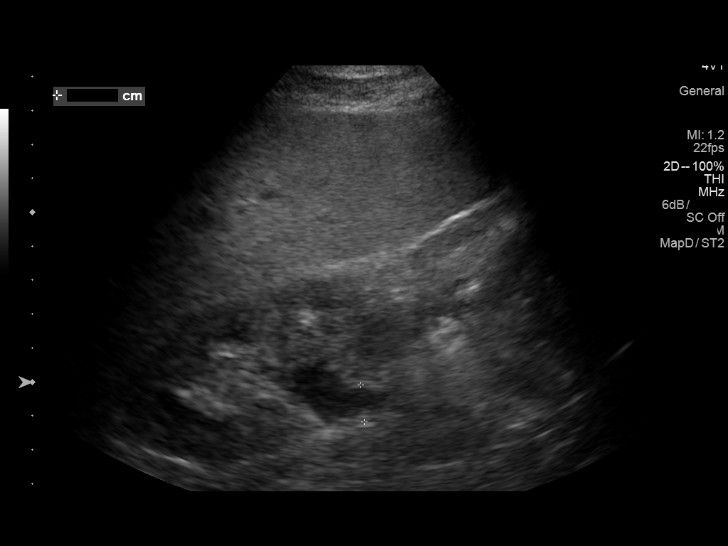
[im 24/70]
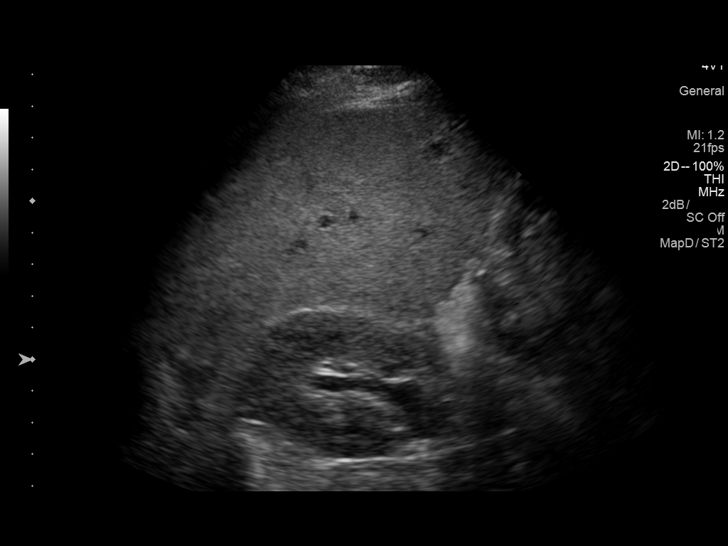
[im 26/70]
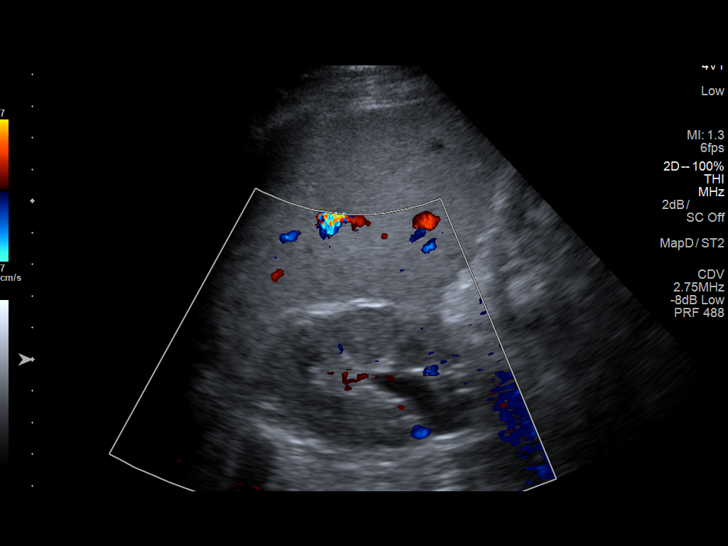
[im 32/70]
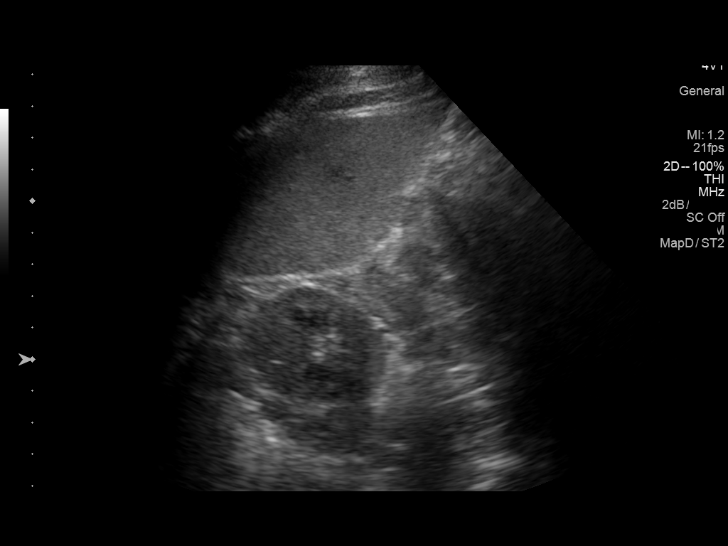
[im 38/70]
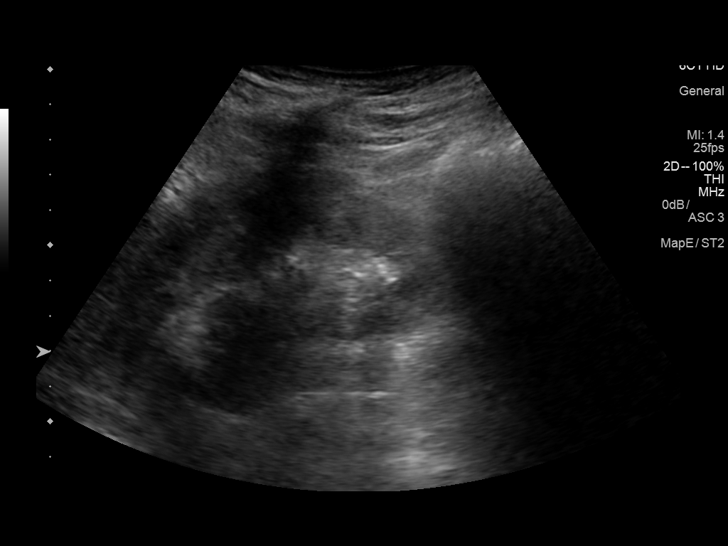
[im 44/70]
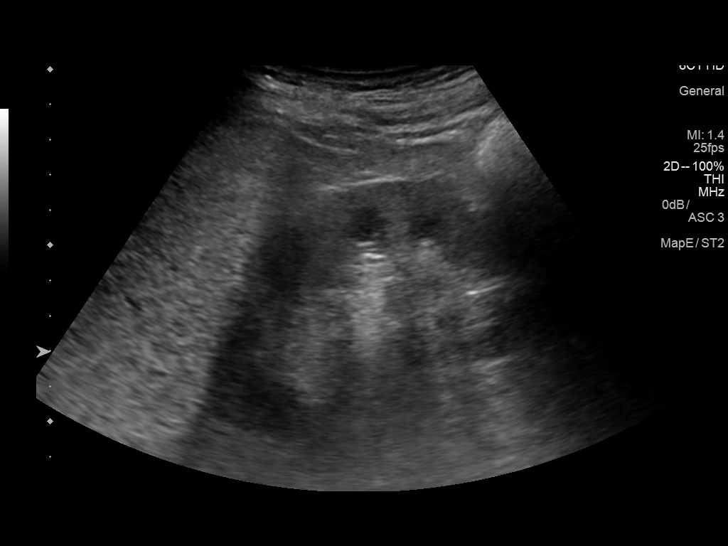
[im 47/70]
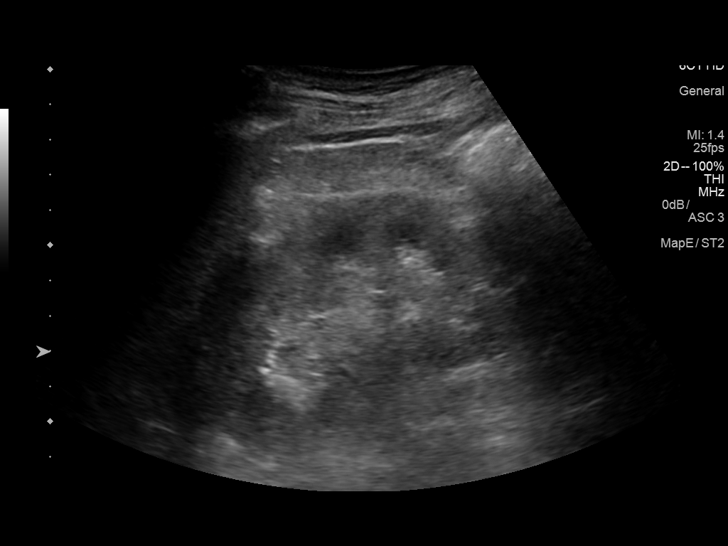
[im 52/70]
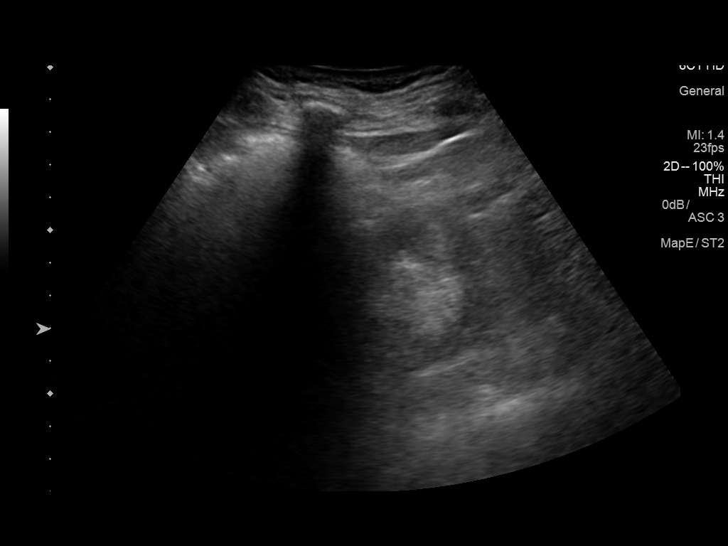
[im 58/70]
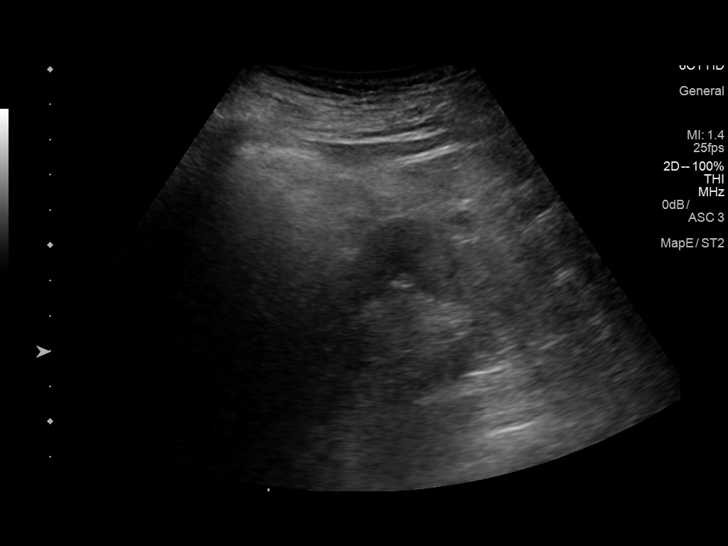
[im 64/70]
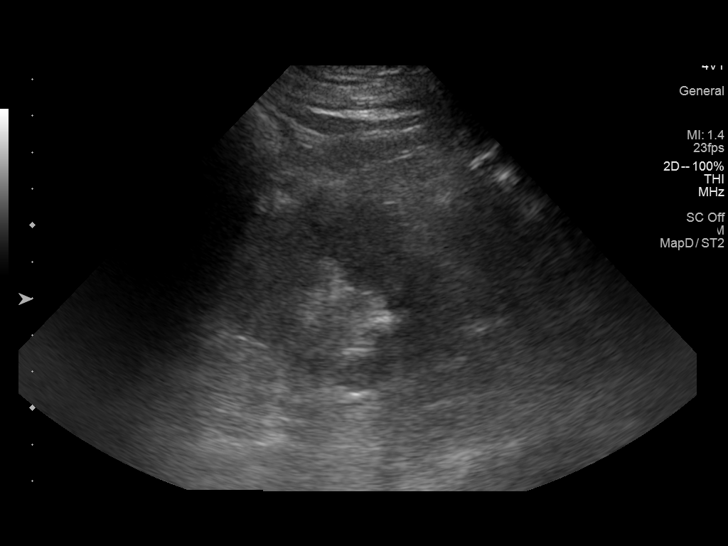
[im 70/70]
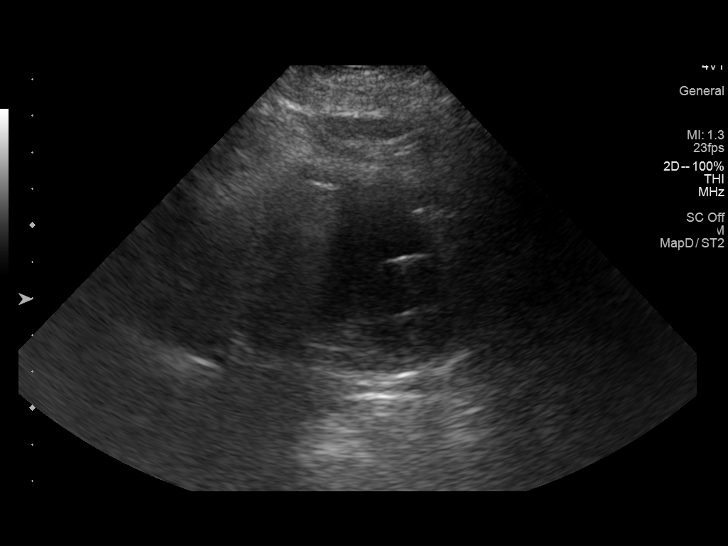

[14 of 25 positions shown; findings below may reference images not displayed]

FINDINGS: Right Kidney:

Length: Approximately 10.9 cm. Moderate hydronephrosis as noted on
the CT, though no obstructing ureteral calculus was identified on
that examination. Well-preserved cortex for age. Normal parenchymal
echotexture. No focal parenchymal abnormality. No shadowing calculi.

Left Kidney:

Length: Approximately 10.0 cm. No hydronephrosis. Well-preserved
cortex for age. Normal parenchymal echotexture. No focal parenchymal
abnormality. Shadowing calculi arising from the lower pole as noted
on the CT.

Bladder:

Decompressed by Foley catheter.
IMPRESSION: 1. Moderate right hydronephrosis as noted on the CT 2 days ago. In
the absence of an obstructing ureteral calculus, chronic UPJ
stenosis is favored.
2. No evidence of left hydronephrosis.
3. Left lower pole renal calculi as noted on CT.
4. Well preserved renal cortex for patient age.
